# Patient Record
Sex: Female | Born: 1955 | Race: White | Hispanic: No | Smoking: Former smoker
Health system: Southern US, Community
[De-identification: ages and names within clinical notes are randomized; demographics above are authoritative.]

## PROBLEM LIST (undated history)

## (undated) DIAGNOSIS — I5022 Chronic systolic (congestive) heart failure: Secondary | ICD-10-CM

## (undated) DIAGNOSIS — E079 Disorder of thyroid, unspecified: Secondary | ICD-10-CM

## (undated) DIAGNOSIS — R634 Abnormal weight loss: Secondary | ICD-10-CM

## (undated) DIAGNOSIS — I428 Other cardiomyopathies: Secondary | ICD-10-CM

## (undated) DIAGNOSIS — I447 Left bundle-branch block, unspecified: Secondary | ICD-10-CM

## (undated) DIAGNOSIS — E039 Hypothyroidism, unspecified: Secondary | ICD-10-CM

## (undated) DIAGNOSIS — F419 Anxiety disorder, unspecified: Secondary | ICD-10-CM

## (undated) DIAGNOSIS — M797 Fibromyalgia: Secondary | ICD-10-CM

## (undated) HISTORY — DX: Abnormal weight loss: R63.4

## (undated) HISTORY — DX: Disorder of thyroid, unspecified: E07.9

## (undated) HISTORY — PX: TUBAL LIGATION: SHX77

## (undated) HISTORY — DX: Left bundle-branch block, unspecified: I44.7

## (undated) HISTORY — DX: Other cardiomyopathies: I42.8

## (undated) HISTORY — PX: CHOLECYSTECTOMY: SHX55

## (undated) HISTORY — DX: Chronic systolic (congestive) heart failure: I50.22

## (undated) HISTORY — PX: ANKLE SURGERY: SHX546

## (undated) HISTORY — DX: Anxiety disorder, unspecified: F41.9

## (undated) HISTORY — DX: Fibromyalgia: M79.7

---

## 2020-11-18 ENCOUNTER — Ambulatory Visit: Payer: 59 | Admitting: General Surgery

## 2020-11-27 ENCOUNTER — Ambulatory Visit: Payer: 59 | Admitting: General Surgery

## 2020-12-04 ENCOUNTER — Ambulatory Visit: Payer: 59 | Admitting: General Surgery

## 2020-12-11 ENCOUNTER — Ambulatory Visit: Payer: 59 | Admitting: General Surgery

## 2021-01-01 ENCOUNTER — Other Ambulatory Visit: Payer: Self-pay

## 2021-01-01 ENCOUNTER — Encounter: Payer: Self-pay | Admitting: General Surgery

## 2021-01-01 ENCOUNTER — Ambulatory Visit (INDEPENDENT_AMBULATORY_CARE_PROVIDER_SITE_OTHER): Payer: 59 | Admitting: General Surgery

## 2021-01-01 VITALS — BP 115/72 | HR 83 | Temp 98.2°F | Resp 16 | Ht 63.5 in | Wt 123.0 lb

## 2021-01-01 DIAGNOSIS — M6208 Separation of muscle (nontraumatic), other site: Secondary | ICD-10-CM

## 2021-01-01 NOTE — Progress Notes (Signed)
Stacey Hartman; [id number]; 07-01-1956   HPI Patient is a 65 year old white female who was referred to my care by Dr. [name] for evaluation and treatment of a possible ventral hernia.  Patient states she has lost a lot of weight over the past few years and does have increased swelling along the upper portion of her abdomen when she is sitting up.  She denies any nausea or vomiting.  She does have a hiatal hernia for which she takes Prilosec.  She has done this for many years.  She has never had abdominal surgery in the upper abdomen. History reviewed. No pertinent past medical history.  History reviewed. No pertinent surgical history.  History reviewed. No pertinent family history.  Current Outpatient Medications on File Prior to Visit  Medication Sig Dispense Refill  . acyclovir (ZOVIRAX) 800 MG tablet Take 800 mg by mouth 5 (five) times daily.    Marland Kitchen esomeprazole (NEXIUM) 20 MG capsule Take 1 capsule by mouth daily.    Marland Kitchen gabapentin (NEURONTIN) 300 MG capsule Take by mouth.    . levothyroxine (SYNTHROID) 150 MCG tablet      No current facility-administered medications on file prior to visit.    No Known Allergies  Social History   Substance and Sexual Activity  Alcohol Use Not Currently    Social History   Tobacco Use  Smoking Status Former Smoker  Smokeless Tobacco Never Used    Review of Systems  Constitutional: Negative.   HENT: Negative.   Eyes: Negative.   Respiratory: Negative.   Cardiovascular: Negative.   Gastrointestinal: Positive for abdominal pain and heartburn.  Genitourinary: Negative.   Musculoskeletal: Positive for joint pain.  Skin: Negative.   Neurological: Negative.   Endo/Heme/Allergies: Negative.   Psychiatric/Behavioral: Negative.     Objective   Vitals:   01/01/21 1010  BP: 115/72  Pulse: 83  Resp: 16  Temp: 98.2 F (36.8 C)  SpO2: 96%    Physical Exam Vitals reviewed.  Constitutional:      Appearance: Normal appearance. She is  normal weight. She is not ill-appearing.  HENT:     Head: Normocephalic and atraumatic.  Cardiovascular:     Rate and Rhythm: Normal rate and regular rhythm.     Heart sounds: Normal heart sounds. No murmur heard. No friction rub. No gallop.   Pulmonary:     Effort: Pulmonary effort is normal. No respiratory distress.     Breath sounds: Normal breath sounds. No stridor. No wheezing, rhonchi or rales.  Abdominal:     General: Abdomen is flat. Bowel sounds are normal. There is no distension.     Palpations: Abdomen is soft. There is no mass.     Tenderness: There is no abdominal tenderness. There is no guarding or rebound.     Hernia: No hernia is present.     Comments: Patient does have a diastases recti.  No discrete hernia is present.  Extra adipose tissue was noted transversely across the top portion of the abdomen.  Skin:    General: Skin is warm and dry.  Neurological:     Mental Status: She is alert and oriented to person, place, and time.   Primary care notes reviewed  Assessment  Diastases recti, no ventral hernia appreciated. Plan   Literature was given concerning the diastases recti.  No surgical intervention warranted at this time.  Follow-up as needed.

## 2021-01-01 NOTE — Patient Instructions (Signed)
Diastasis Recti  Diastasis recti is a condition in which the muscles of the abdomen (rectus abdominis muscles) become thin and separate. The result is a wider space between the muscles of the right and left abdomen (abdominal muscles). This wider space between the muscles may cause a bulge in the middle of the abdomen. This bulge may be noticed when a person is straining or when he or she sits up after lying down. Diastasis recti can affect men and women. It is most common among pregnant women, babies, people with obesity, and people who have had abdominal surgery. Exercise or surgery may help correct this condition. What are the causes? Common causes of this condition include:  Pregnancy. As the uterus grows in size, it puts pressure on the abdominal muscles, causing the muscles to separate.  Obesity. Excess fat puts pressure on abdominal muscles.  Weight lifting.  Some exercises of the abdomen.  Advanced age.  Genetics.  Having had surgery on the abdomen before. What increases the risk? This condition is more likely to develop in:  Women.  Newborns, especially newborns who are born early (prematurely). What are the signs or symptoms? Common symptoms of this condition include:  A bulge in the middle of your abdomen. You will notice it most when you sit up or strain.  Pain in your low back, hips, or the area between your hip bones (pelvis).  Constipation.  Being unable to control when you urinate (urinary incontinence).  Bloating.  Poor posture. How is this diagnosed? This condition is diagnosed with a physical exam. During the exam, your health care provider will ask you to lie flat on your back and do a crunch or half sit-up. If you have diastasis recti, a bulge will appear lengthwise between your abdominal muscles in the center of your abdomen. Your health care provider will measure the gap between your muscles with one of the following:  A medical device used to measure  the space between two objects (caliper).  A tape measure.  CT scan.  Ultrasound.  Finger spaces. Your health care provider will measure the space using his or her fingers. How is this treated? If your muscle separation is not too large, you may not need treatment. However, if you are a woman who plans to become pregnant again, you should treat this condition before your next pregnancy. Treatment may include:  Physical therapy exercises to strengthen and tighten your abdominal muscles.  Lifestyle changes such as weight loss and exercise.  Over-the-counter pain medicines as needed.  Surgery to correct the separation. Follow these instructions at home: Activity  Return to your normal activities as told by your health care provider. Ask your health care provider what activities are safe for you.  Do exercises as told by your health care provider. Make sure you are doing your exercises and movements correctly when lifting weights or doing exercises using your abdominal muscles or the muscles in the center of your body that give stability (core muscles). Proper form can help to prevent this condition from happening again. General instructions  If you are overweight, ask your health care provider for help with weight loss. Losing even a small amount of weight can help to improve your diastasis recti.  Take over-the-counter or prescription medicines only as told by your health care provider.  Do not strain. Straining can make the separation worse. Examples of straining include: ? Pushing hard to have a bowel movement, such as when you have constipation. ? Lifting heavy  objects or lifting children. ? Standing up and sitting down.  You may need to take these actions to prevent or treat constipation: ? Drink enough fluid to keep your urine pale yellow. ? Take over-the-counter or prescription medicines. ? Eat foods that are high in fiber, such as beans, whole grains, and fresh fruits and  vegetables. ? Limit foods that are high in fat and processed sugars, such as fried or sweet foods.  Keep all follow-up visits. This is important. Contact a health care provider if:  You notice a new bulge in your abdomen. Get help right away if:  You experience severe discomfort in your abdomen.  You develop severe abdominal pain along with nausea, vomiting, or a fever. Summary  Diastasis recti is a condition in which the muscles of the abdomen (rectus abdominismuscles) become thin and separate. You may notice a bulge in your abdomen because the space has widened between the muscles of the right and left abdomen.  The most common symptom is a bulge in the middle of your abdomen. You will notice it most when you sit up or strain.  This condition is diagnosed with a physical exam.  If the muscle separation is not too big, you may not need treatment. Otherwise, you may need to do physical therapy or have surgery. This information is not intended to replace advice given to you by your health care provider. Make sure you discuss any questions you have with your health care provider. Document Revised: 04/18/2020 Document Reviewed: 04/18/2020 Elsevier Patient Education  [place].

## 2021-07-08 ENCOUNTER — Institutional Professional Consult (permissible substitution): Payer: 59 | Admitting: Pulmonary Disease

## 2021-08-07 ENCOUNTER — Ambulatory Visit: Payer: 59 | Admitting: Allergy & Immunology

## 2021-08-13 ENCOUNTER — Ambulatory Visit: Payer: 59 | Admitting: Allergy

## 2021-08-25 ENCOUNTER — Ambulatory Visit (INDEPENDENT_AMBULATORY_CARE_PROVIDER_SITE_OTHER): Payer: 59 | Admitting: Internal Medicine

## 2021-08-25 ENCOUNTER — Other Ambulatory Visit: Payer: Self-pay

## 2021-08-25 ENCOUNTER — Encounter: Payer: Self-pay | Admitting: Internal Medicine

## 2021-08-25 DIAGNOSIS — R0609 Other forms of dyspnea: Secondary | ICD-10-CM | POA: Diagnosis not present

## 2021-08-25 NOTE — Patient Instructions (Addendum)
Stiolto one puff a day x one week and if not better start on 2 puffs each am   Omeprazole 20 mg Take 30- 60 min before your first and last meals of the day   GERD (REFLUX)  is an extremely common cause of respiratory symptoms just like yours , many times with no obvious heartburn at all.    It can be treated with medication, but also with lifestyle changes including elevation of the head of your bed (ideally with 6 -8inch blocks under the headboard of your bed),  Smoking cessation, avoidance of late meals, excessive alcohol, and avoid fatty foods, chocolate, peppermint, colas, red wine, and acidic juices such as orange juice.  NO MINT OR MENTHOL PRODUCTS SO NO COUGH DROPS  USE SUGARLESS CANDY INSTEAD (Jolley ranchers or Stover's or Life Savers) or even ice chips will also do - the key is to swallow to prevent all throat clearing. NO OIL BASED VITAMINS - use powdered substitutes.  Avoid fish oil when coughing.   Please remember to go to the lab department   for your tests - we will call you with the results when they are available.     Please remember to go to the  x-ray department  @  [facility] for your tests - we will call you with the results when they are available      If you do have ulcerative colitis it may be affecting your airways - discuss with your GI doctor   Please schedule a follow up office visit in 6 weeks, call sooner if needed with pfts next available

## 2021-08-25 NOTE — Progress Notes (Signed)
Stacey Hartman, female    DOB: 13-Feb-1956,    MRN: [id number]   Brief patient profile:  2   yowf quit smoking  around 2002 with cough/sob completely improved to point where no symptoms including walking up to several miles including hills but dx fibromyalgia since 2012  then August 2021 salmonella/rx as out pt with "pcn" then severe skin peeling lost 22 lb and persistent diarrhea/sob/ cough referred to pulmonary clinic in [facility]  08/25/2021 by [name] already under care of skin doctor in [place], GI doctor in [place], rheumatology eval pending.      History of Present Illness  08/25/2021  Pulmonary/ 1st office eval/ Stacey Hartman / [place] [facility]  Chief Complaint  Patient presents with   Consult    Hx of COPD. After penicillin has noticed struggle with breathing since then (03/2020).   Coughs up green mucus    Dyspnea:  getting worse to point of 100 ft doe Cough: congested cough worse when lies down  Sleep: bed is flat with 3 pillows  SABA use: spiriva and albuterol helped / has not tried pred for this illness but took it previously for "bronchitis" and seemed to help Omeprazole bid per GI   No obvious pattern in day to day or daytime variability or assoc or mucus plugs or hemoptysis or cp or chest tightness, subjective wheeze or overt sinus or hb symptoms.     Also denies any obvious fluctuation of symptoms with weather or environmental changes or other aggravating or alleviating factors except as outlined above   No unusual exposure hx or h/o childhood pna/ asthma or knowledge of premature birth.  Current Allergies, Complete Past Medical History, Past Surgical History, Family History, and Social History were reviewed in [employer] record.  ROS  The following are not active complaints unless bolded Hoarseness, sore throat, dysphagia, dental problems, itching, sneezing,  nasal congestion or discharge of excess mucus or purulent secretions, ear ache,   fever,  chills, sweats, unintended wt loss or wt gain, classically pleuritic or exertional cp,  orthopnea pnd or arm/hand swelling  or leg swelling, presyncope, palpitations, abdominal pain, anorexia, nausea, vomiting, diarrhea  or change in bowel habits or change in bladder habits, change in stools or change in urine, dysuria, hematuria,  rash, arthralgias, visual complaints, headache, numbness, weakness or ataxia or problems with walking or coordination,  change in mood or  memory.           No past medical history on file.  Outpatient Medications Prior to Visit-  - NOTE:   Unable to verify as accurately reflecting what pt takes    Medication Sig Dispense Refill   acyclovir (ZOVIRAX) 800 MG tablet Take 800 mg by mouth 5 (five) times daily.     esomeprazole (NEXIUM) 20 MG capsule Take 1 capsule by mouth daily.     gabapentin (NEURONTIN) 300 MG capsule Take by mouth.     levothyroxine (SYNTHROID) 150 MCG tablet      No facility-administered medications prior to visit.     Objective:     BP 124/80 (BP Location: Left Arm, Patient Position: Sitting)    Pulse 87    Temp 98.8 F (37.1 C) (Temporal)    Ht 5\' 4"  (1.626 m)    Wt 128 lb (58.1 kg)    SpO2 97% Comment: ra   BMI 21.97 kg/m   SpO2: 97 % (ra)  Amb thin wf nad/ min congested cough  HEENT : pt wearing mask  not removed for exam due to covid - 19 concerns.   NECK :  without JVD/Nodes/TM/ nl carotid upstrokes bilaterally   LUNGS: no acc muscle use,  Min barrel  contour chest wall with bilateral  slightly decreased bs s audible wheeze and  without cough on insp or exp maneuvers and min  Hyperresonant  to  percussion bilaterally     CV:  RRR  no s3 or murmur or increase in P2, and no edema   ABD:  soft and nontender with pos end  insp Hoover's  in the supine position. No bruits or organomegaly appreciated, bowel sounds nl  MS:   Nl gait/  ext warm without deformities, calf tenderness, cyanosis or clubbing No obvious joint restrictions    SKIN: warm and dry without lesions    NEURO:  alert, approp, nl sensorium with  no motor or cerebellar deficits apparent.       CXR PA and Lateral:   08/25/2021 :    I personally reviewed images and agree with radiology impression as follows:    Did not go for cxr as rec     Assessment   DOE (dyspnea on exertion) Onset was August 2021 s/p quit smoking 2003 s residual doe - assoc with chronic diarrhea at wt loss ? Inflammatory bowel dz?  - 08/25/2021   Walked on RA  x  3  lap(s) =  approx 450  ft  @ mod to fast pace, stopped due to end of study, sob p 1st lap with lowest 02 sats 97% - 08/25/2021  After extensive coaching inhaler device,  effectiveness =    75% with respimat > try stiolto 1 qd x one week then 2 qd p that if tolerates/effective  Symptoms are markedly disproportionate to objective findings and not clear to what extent this is actually a pulmonary  problem but pt does appear to have difficult to sort out respiratory symptoms of unknown origin for which  DDX  = almost all start with A and  include Adherence, Ace Inhibitors, Acid Reflux, Active Sinus Disease, Alpha 1 Antitripsin deficiency, Anxiety masquerading as Airways dz,  ABPA,  Allergy(esp in young), Aspiration (esp in elderly), Adverse effects of meds,  Active smoking or Vaping, A bunch of PE's/clot burden (a few small clots can't cause this syndrome unless there is already severe underlying pulm or vascular dz with poor reserve),  Anemia or thyroid disorder, plus two Bs  = Bronchiectasis and Beta blocker use..and one C= CHF     Adherence is always the initial "prime suspect" and is a multilayered concern that requires a "trust but verify" approach in every patient - starting with knowing how to use medications, especially inhalers, correctly, keeping up with refills and understanding the fundamental difference between maintenance and prns vs those medications only taken for a very short course and then stopped and not  refilled.  - see smi teaching above -return with all meds in hand using a trust but verify approach to confirm accurate Medication  Reconciliation The principal here is that until we are certain that the  patients are doing what we've asked, it makes no sense to ask them to do more.   ? Acid (or non-acid) GERD > always difficult to exclude as up to 75% of pts in some series report no assoc GI/ Heartburn symptoms> rec continue max (24h)  acid suppression and diet restrictions/ reviewed     ? Allergies/Asthma/ copd > check profile, hold off on more abx  for now and rx with stiolto for now pending return for pfts but doubt she has severe copd, perhaps CB and ? Airway involvement from inflammatory bowel dz  ? Alpha one AT > check phenotype   ? Anemia/ thryoid dz  ? Adverse drug effects > none of the usual suspects listed   ? Anxiety/depression/ deconditioning  > usually at the bottom of this list of usual suspects but   may interfere with adherence and also interpretation of response or lack thereof to symptom management which can be quite subjective.   ? Active sinus dz/ bronchiectasis > CT's can be considered next ov if not improving.  ? chf > send bnp   Each maintenance medication was reviewed in detail including emphasizing most importantly the difference between maintenance and prns and under what circumstances the prns are to be triggered using an action plan format where appropriate.  Total time for H and P, chart review, counseling, reviewing smi device(s) , directly observing portions of ambulatory 02 saturation study/ and generating customized AVS unique to this office visit / same day charting = 47 min                  Christinia Gully, MD 08/25/2021

## 2021-08-26 ENCOUNTER — Encounter: Payer: Self-pay | Admitting: Internal Medicine

## 2021-08-26 NOTE — Assessment & Plan Note (Addendum)
Onset was August 2021 s/p quit smoking 2003 s residual doe - assoc with chronic diarrhea at wt loss ? Inflammatory bowel dz?  - 08/25/2021   Walked on RA  x  3  lap(s) =  approx 450  ft  @ mod to fast pace, stopped due to end of study, sob p 1st lap with lowest 02 sats 97% - 08/25/2021  After extensive coaching inhaler device,  effectiveness =    75% with respimat > try stiolto 1 qd x one week then 2 qd p that if tolerates/effective  Symptoms are markedly disproportionate to objective findings and not clear to what extent this is actually a pulmonary  problem but pt does appear to have difficult to sort out respiratory symptoms of unknown origin for which  DDX  = almost all start with A and  include Adherence, Ace Inhibitors, Acid Reflux, Active Sinus Disease, Alpha 1 Antitripsin deficiency, Anxiety masquerading as Airways dz,  ABPA,  Allergy(esp in young), Aspiration (esp in elderly), Adverse effects of meds,  Active smoking or Vaping, A bunch of PE's/clot burden (a few small clots can't cause this syndrome unless there is already severe underlying pulm or vascular dz with poor reserve),  Anemia or thyroid disorder, plus two Bs  = Bronchiectasis and Beta blocker use..and one C= CHF     Adherence is always the initial "prime suspect" and is a multilayered concern that requires a "trust but verify" approach in every patient - starting with knowing how to use medications, especially inhalers, correctly, keeping up with refills and understanding the fundamental difference between maintenance and prns vs those medications only taken for a very short course and then stopped and not refilled.  - see smi teaching above -return with all meds in hand using a trust but verify approach to confirm accurate Medication  Reconciliation The principal here is that until we are certain that the  patients are doing what we've asked, it makes no sense to ask them to do more.   ? Acid (or non-acid) GERD > always difficult to  exclude as up to 75% of pts in some series report no assoc GI/ Heartburn symptoms> rec continue max (24h)  acid suppression and diet restrictions/ reviewed     ? Allergies/Asthma/ copd > check profile, hold off on more abx for now and rx with stiolto for now pending return for pfts but doubt she has severe copd, perhaps CB and ? Airway involvement from inflammatory bowel dz  ? Alpha one AT > check phenotype   ? Anemia/ thryoid dz  ? Adverse drug effects > none of the usual suspects listed   ? Anxiety/depression/ deconditioning  > usually at the bottom of this list of usual suspects but   may interfere with adherence and also interpretation of response or lack thereof to symptom management which can be quite subjective.   ? Active sinus dz/ bronchiectasis > CT's can be considered next ov if not improving.  ? chf > send bnp   Each maintenance medication was reviewed in detail including emphasizing most importantly the difference between maintenance and prns and under what circumstances the prns are to be triggered using an action plan format where appropriate.  Total time for H and P, chart review, counseling, reviewing smi device(s) , directly observing portions of ambulatory 02 saturation study/ and generating customized AVS unique to this office visit / same day charting = 47 min

## 2021-08-27 LAB — ALPHA-1-ANTITRYPSIN PHENOTYP: A-1 Antitrypsin: 146 mg/dL (ref 101–187)

## 2021-08-27 LAB — CBC WITH DIFFERENTIAL/PLATELET
Basophils Absolute: 0.1 10*3/uL (ref 0.0–0.2)
Basos: 1 %
EOS (ABSOLUTE): 0.3 10*3/uL (ref 0.0–0.4)
Eos: 4 %
Hematocrit: 38.3 % (ref 34.0–46.6)
Hemoglobin: 13.2 g/dL (ref 11.1–15.9)
Immature Grans (Abs): 0 10*3/uL (ref 0.0–0.1)
Immature Granulocytes: 0 %
Lymphocytes Absolute: 2.2 10*3/uL (ref 0.7–3.1)
Lymphs: 34 %
MCH: 31 pg (ref 26.6–33.0)
MCHC: 34.5 g/dL (ref 31.5–35.7)
MCV: 90 fL (ref 79–97)
Monocytes Absolute: 0.5 10*3/uL (ref 0.1–0.9)
Monocytes: 7 %
Neutrophils Absolute: 3.5 10*3/uL (ref 1.4–7.0)
Neutrophils: 54 %
Platelets: 193 10*3/uL (ref 150–450)
RBC: 4.26 x10E6/uL (ref 3.77–5.28)
RDW: 13.5 % (ref 11.7–15.4)
WBC: 6.5 10*3/uL (ref 3.4–10.8)

## 2021-08-27 LAB — BASIC METABOLIC PANEL
BUN/Creatinine Ratio: 19 (ref 12–28)
BUN: 15 mg/dL (ref 8–27)
CO2: 23 mmol/L (ref 20–29)
Calcium: 9.1 mg/dL (ref 8.7–10.3)
Chloride: 107 mmol/L — ABNORMAL HIGH (ref 96–106)
Creatinine, Ser: 0.8 mg/dL (ref 0.57–1.00)
Glucose: 86 mg/dL (ref 70–99)
Potassium: 4.7 mmol/L (ref 3.5–5.2)
Sodium: 147 mmol/L — ABNORMAL HIGH (ref 134–144)
eGFR: 82 mL/min/{1.73_m2} (ref >59)

## 2021-08-27 LAB — BRAIN NATRIURETIC PEPTIDE: BNP: 442.8 pg/mL — ABNORMAL HIGH (ref 0.0–100.0)

## 2021-08-27 LAB — IGE: IgE (Immunoglobulin E), Serum: 46 IU/mL (ref 6–495)

## 2021-08-27 LAB — SEDIMENTATION RATE: Sed Rate: 5 mm/hr (ref 0–40)

## 2021-08-28 ENCOUNTER — Other Ambulatory Visit: Payer: Self-pay

## 2021-08-28 DIAGNOSIS — R0602 Shortness of breath: Secondary | ICD-10-CM

## 2021-08-28 DIAGNOSIS — R0609 Other forms of dyspnea: Secondary | ICD-10-CM

## 2021-10-01 ENCOUNTER — Other Ambulatory Visit: Payer: Self-pay

## 2021-10-01 ENCOUNTER — Ambulatory Visit (HOSPITAL_COMMUNITY)
Admission: RE | Admit: 2021-10-01 | Discharge: 2021-10-01 | Disposition: A | Discharge status: Home / Self Care | Payer: 59 | Source: Ambulatory Visit | Attending: Internal Medicine | Admitting: Internal Medicine

## 2021-10-01 DIAGNOSIS — R0602 Shortness of breath: Secondary | ICD-10-CM | POA: Diagnosis present

## 2021-10-01 DIAGNOSIS — R0609 Other forms of dyspnea: Secondary | ICD-10-CM | POA: Insufficient documentation

## 2021-10-01 LAB — ECHOCARDIOGRAM COMPLETE
AR max vel: 2.36 cm2
AV Area VTI: 2.22 cm2
AV Area mean vel: 2.31 cm2
AV Mean grad: 3 mmHg
AV Peak grad: 5.2 mmHg
Ao pk vel: 1.14 m/s
Area-P 1/2: 5.66 cm2
Calc EF: 22.7 %
MV VTI: 2.03 cm2
S' Lateral: 5.7 cm
Single Plane A2C EF: 23.8 %
Single Plane A4C EF: 20.9 %

## 2021-10-01 IMAGING — DX DG CHEST 2V
2 series · 2 of 2 positions shown · non-contrast
Comparison: No priors.

CLINICAL DATA: 65-year-old female with history of worsening
productive cough for the past 3 weeks.

EXAM:
CHEST - 2 VIEW

[chest pa]
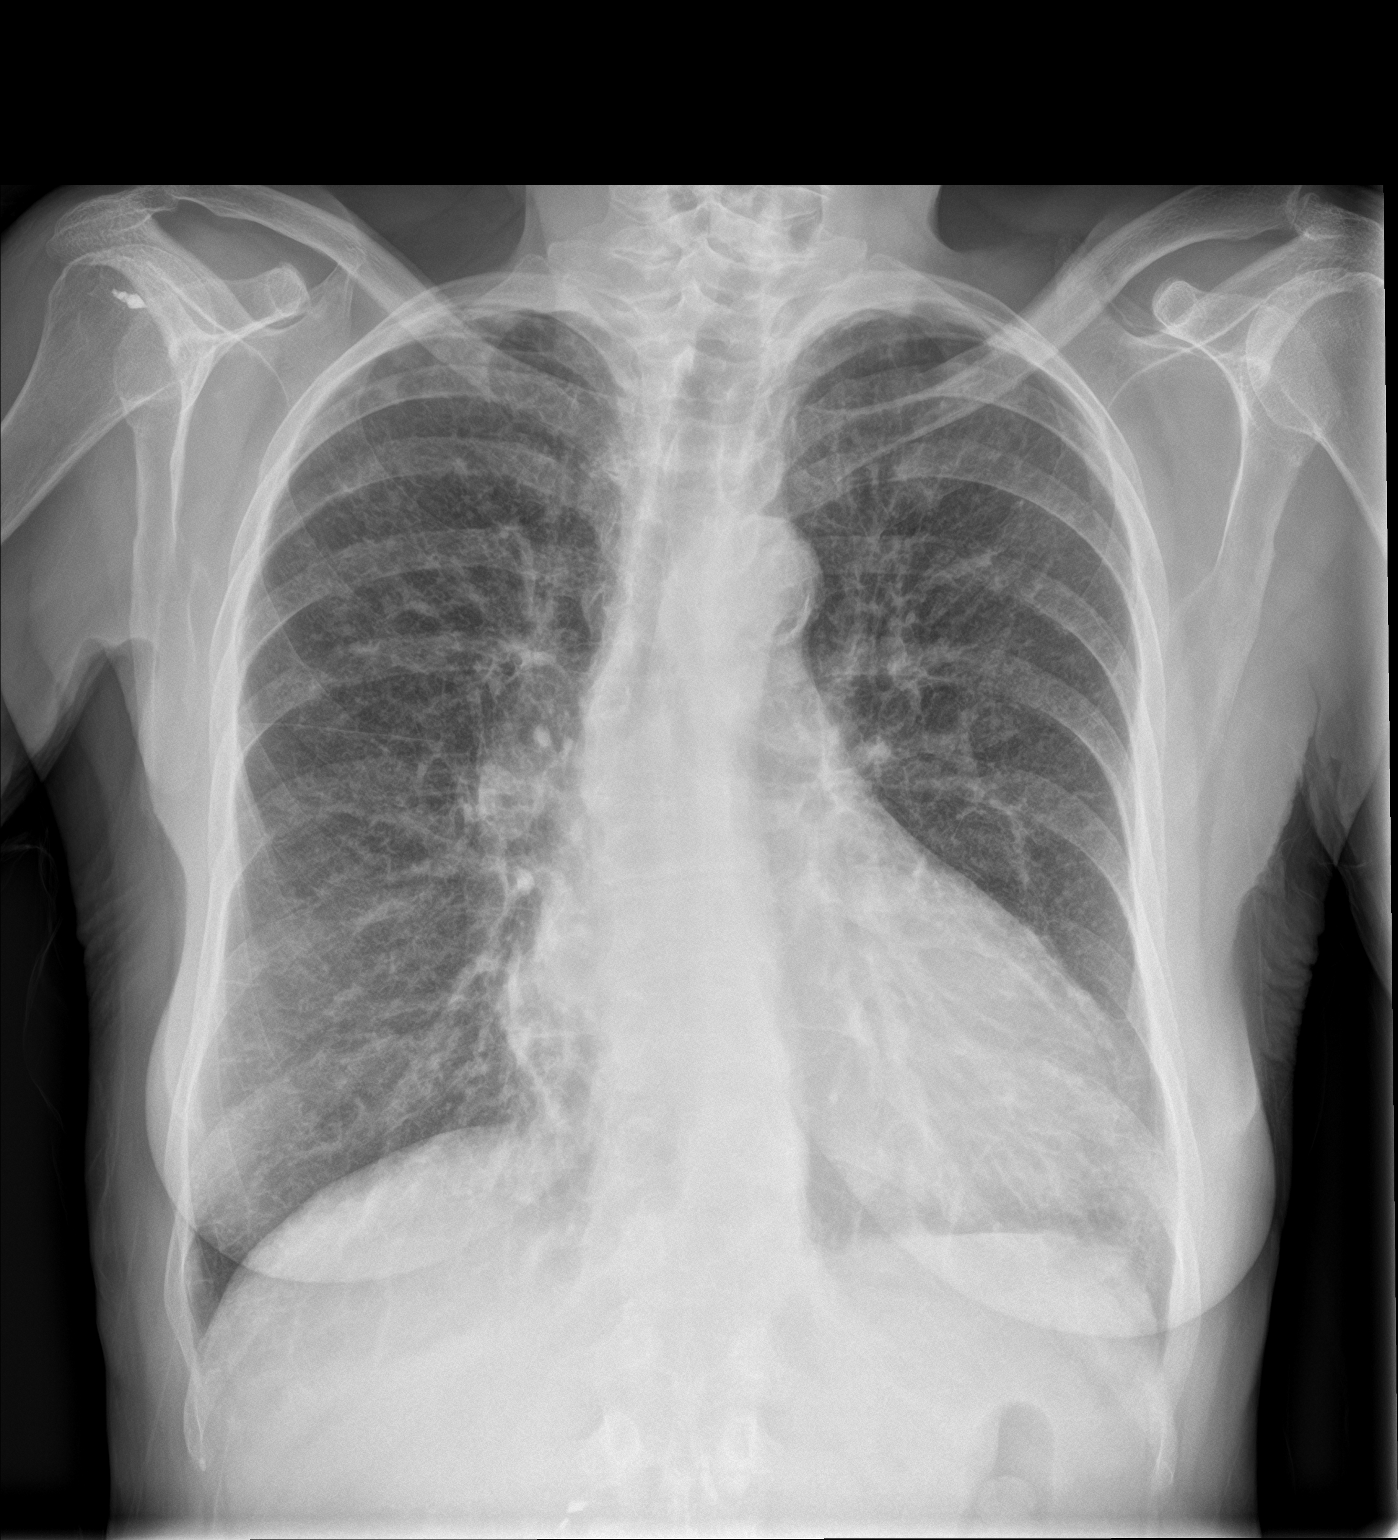

[chest lat]
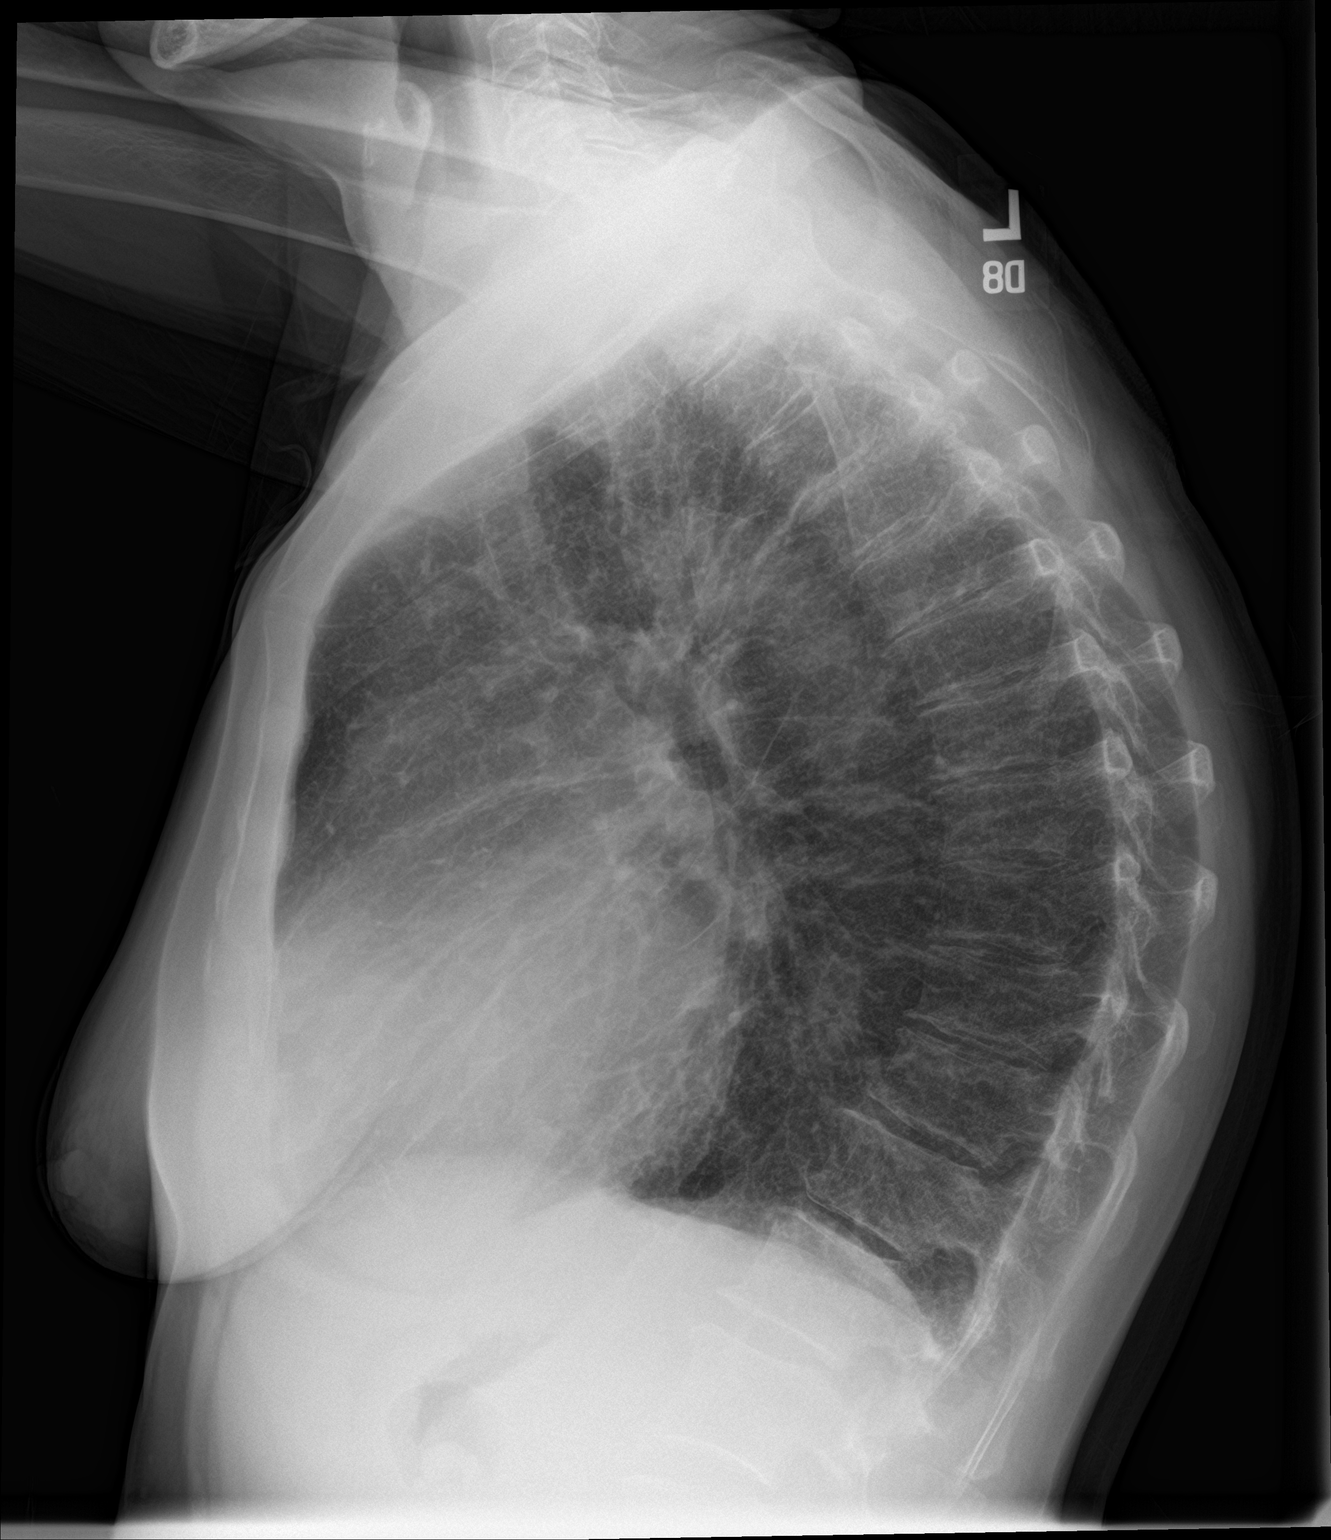

[2 of 2 positions shown; findings below may reference images not displayed]

FINDINGS: Lung volumes are normal. No consolidative airspace disease. However,
there is widespread interstitial prominence and diffuse
peribronchial cuffing. No pleural effusions. No pneumothorax. No
pulmonary nodule or mass noted. Pulmonary vasculature and the
cardiomediastinal silhouette are within normal limits.
Atherosclerosis in the thoracic aorta.
IMPRESSION: 1. The appearance the chest suggests acute bronchitis.
2. Aortic atherosclerosis.

## 2021-10-01 NOTE — Progress Notes (Signed)
*  PRELIMINARY RESULTS* Echocardiogram 2D Echocardiogram has been performed.  Stacey Hartman 10/01/2021, 3:02 PM

## 2021-10-02 ENCOUNTER — Telehealth: Payer: Self-pay | Admitting: Internal Medicine

## 2021-10-02 DIAGNOSIS — I5020 Unspecified systolic (congestive) heart failure: Secondary | ICD-10-CM

## 2021-10-02 NOTE — Telephone Encounter (Signed)
Stacey Rockers, MD  10/01/2021  3:53 PM EST     Call patient :  I tried to call twice pm 10/01/2021 but "mailbox is full" and she has a problem that needs attention asap  = reduced ejection / poor pump funciton ? 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  She stated that PCP office called and she was told that PCP provider is out of town. She would like for our office to place referral to cardiology. Urgent Referral placed.  Patient is also requesting CXR results.   Dr. [name], please advise. Thanks

## 2021-10-02 NOTE — Telephone Encounter (Signed)
Patient is aware of below recommendations and voiced her understanding.  She is seeing Dr. Harriet Masson 10/05/2021.  Routing to Dr. [name] as an Juluis Rainier

## 2021-10-05 ENCOUNTER — Ambulatory Visit (INDEPENDENT_AMBULATORY_CARE_PROVIDER_SITE_OTHER): Payer: 59 | Admitting: Cardiology

## 2021-10-05 ENCOUNTER — Encounter (HOSPITAL_COMMUNITY): Payer: Self-pay

## 2021-10-05 ENCOUNTER — Encounter (HOSPITAL_COMMUNITY)
Admission: EM | Disposition: A | Discharge status: Home Health | Payer: Self-pay | Source: Home / Self Care | Attending: Cardiology

## 2021-10-05 ENCOUNTER — Inpatient Hospital Stay (HOSPITAL_COMMUNITY)
Admission: EM | Admit: 2021-10-05 | Discharge: 2021-10-09 | DRG: 286 | Disposition: A | Discharge status: Home Health | Payer: 59 | Attending: Cardiology | Admitting: Cardiology

## 2021-10-05 ENCOUNTER — Other Ambulatory Visit: Payer: Self-pay

## 2021-10-05 ENCOUNTER — Encounter: Payer: Self-pay | Admitting: Cardiology

## 2021-10-05 ENCOUNTER — Inpatient Hospital Stay (HOSPITAL_COMMUNITY): Payer: 59

## 2021-10-05 VITALS — BP 120/84 | HR 81 | Ht 63.5 in | Wt 125.4 lb

## 2021-10-05 DIAGNOSIS — J439 Emphysema, unspecified: Secondary | ICD-10-CM | POA: Diagnosis present

## 2021-10-05 DIAGNOSIS — I447 Left bundle-branch block, unspecified: Secondary | ICD-10-CM | POA: Diagnosis present

## 2021-10-05 DIAGNOSIS — I63411 Cerebral infarction due to embolism of right middle cerebral artery: Secondary | ICD-10-CM | POA: Diagnosis not present

## 2021-10-05 DIAGNOSIS — R0989 Other specified symptoms and signs involving the circulatory and respiratory systems: Secondary | ICD-10-CM

## 2021-10-05 DIAGNOSIS — I5021 Acute systolic (congestive) heart failure: Secondary | ICD-10-CM | POA: Diagnosis present

## 2021-10-05 DIAGNOSIS — R911 Solitary pulmonary nodule: Secondary | ICD-10-CM | POA: Diagnosis present

## 2021-10-05 DIAGNOSIS — Z8249 Family history of ischemic heart disease and other diseases of the circulatory system: Secondary | ICD-10-CM | POA: Diagnosis not present

## 2021-10-05 DIAGNOSIS — R0609 Other forms of dyspnea: Secondary | ICD-10-CM

## 2021-10-05 DIAGNOSIS — I272 Pulmonary hypertension, unspecified: Secondary | ICD-10-CM | POA: Insufficient documentation

## 2021-10-05 DIAGNOSIS — Z79899 Other long term (current) drug therapy: Secondary | ICD-10-CM

## 2021-10-05 DIAGNOSIS — F419 Anxiety disorder, unspecified: Secondary | ICD-10-CM | POA: Diagnosis present

## 2021-10-05 DIAGNOSIS — Z20822 Contact with and (suspected) exposure to covid-19: Secondary | ICD-10-CM | POA: Diagnosis present

## 2021-10-05 DIAGNOSIS — R29810 Facial weakness: Secondary | ICD-10-CM | POA: Diagnosis not present

## 2021-10-05 DIAGNOSIS — Z880 Allergy status to penicillin: Secondary | ICD-10-CM

## 2021-10-05 DIAGNOSIS — Z91199 Patient's noncompliance with other medical treatment and regimen due to unspecified reason: Secondary | ICD-10-CM | POA: Diagnosis not present

## 2021-10-05 DIAGNOSIS — Z79890 Hormone replacement therapy (postmenopausal): Secondary | ICD-10-CM | POA: Diagnosis not present

## 2021-10-05 DIAGNOSIS — I340 Nonrheumatic mitral (valve) insufficiency: Secondary | ICD-10-CM

## 2021-10-05 DIAGNOSIS — Z87891 Personal history of nicotine dependence: Secondary | ICD-10-CM

## 2021-10-05 DIAGNOSIS — E039 Hypothyroidism, unspecified: Secondary | ICD-10-CM | POA: Diagnosis present

## 2021-10-05 DIAGNOSIS — R634 Abnormal weight loss: Secondary | ICD-10-CM | POA: Diagnosis present

## 2021-10-05 DIAGNOSIS — I6340 Cerebral infarction due to embolism of unspecified cerebral artery: Secondary | ICD-10-CM | POA: Insufficient documentation

## 2021-10-05 DIAGNOSIS — R29702 NIHSS score 2: Secondary | ICD-10-CM | POA: Diagnosis not present

## 2021-10-05 DIAGNOSIS — I429 Cardiomyopathy, unspecified: Secondary | ICD-10-CM | POA: Diagnosis not present

## 2021-10-05 DIAGNOSIS — I712 Thoracic aortic aneurysm, without rupture, unspecified: Secondary | ICD-10-CM | POA: Diagnosis present

## 2021-10-05 DIAGNOSIS — Z6824 Body mass index (BMI) 24.0-24.9, adult: Secondary | ICD-10-CM | POA: Diagnosis not present

## 2021-10-05 DIAGNOSIS — G43109 Migraine with aura, not intractable, without status migrainosus: Secondary | ICD-10-CM | POA: Diagnosis not present

## 2021-10-05 DIAGNOSIS — R0600 Dyspnea, unspecified: Secondary | ICD-10-CM

## 2021-10-05 DIAGNOSIS — I2510 Atherosclerotic heart disease of native coronary artery without angina pectoris: Secondary | ICD-10-CM | POA: Diagnosis present

## 2021-10-05 DIAGNOSIS — I361 Nonrheumatic tricuspid (valve) insufficiency: Secondary | ICD-10-CM | POA: Diagnosis not present

## 2021-10-05 DIAGNOSIS — I5023 Acute on chronic systolic (congestive) heart failure: Principal | ICD-10-CM | POA: Diagnosis present

## 2021-10-05 DIAGNOSIS — I6349 Cerebral infarction due to embolism of other cerebral artery: Secondary | ICD-10-CM | POA: Diagnosis not present

## 2021-10-05 DIAGNOSIS — R079 Chest pain, unspecified: Secondary | ICD-10-CM | POA: Diagnosis not present

## 2021-10-05 HISTORY — DX: Hypothyroidism, unspecified: E03.9

## 2021-10-05 HISTORY — PX: RIGHT/LEFT HEART CATH AND CORONARY ANGIOGRAPHY: CATH118266

## 2021-10-05 LAB — POCT I-STAT, CHEM 8
BUN: 9 mg/dL (ref 8–23)
BUN: 10 mg/dL (ref 8–23)
Calcium, Ion: 1.16 mmol/L (ref 1.15–1.40)
Calcium, Ion: 1.21 mmol/L (ref 1.15–1.40)
Chloride: 105 mmol/L (ref 98–111)
Chloride: 104 mmol/L (ref 98–111)
Creatinine, Ser: 0.6 mg/dL (ref 0.44–1.00)
Creatinine, Ser: 0.6 mg/dL (ref 0.44–1.00)
Glucose, Bld: 102 mg/dL — ABNORMAL HIGH (ref 70–99)
Glucose, Bld: 127 mg/dL — ABNORMAL HIGH (ref 70–99)
HCT: 41 % (ref 36.0–46.0)
HCT: 40 % (ref 36.0–46.0)
Hemoglobin: 13.9 g/dL (ref 12.0–15.0)
Hemoglobin: 13.6 g/dL (ref 12.0–15.0)
Potassium: 3.9 mmol/L (ref 3.5–5.1)
Potassium: 3.9 mmol/L (ref 3.5–5.1)
Sodium: 143 mmol/L (ref 135–145)
Sodium: 142 mmol/L (ref 135–145)
TCO2: 28 mmol/L (ref 22–32)
TCO2: 25 mmol/L (ref 22–32)

## 2021-10-05 LAB — POCT I-STAT EG7
Acid-Base Excess: 3 mmol/L — ABNORMAL HIGH (ref 0.0–2.0)
Acid-Base Excess: 3 mmol/L — ABNORMAL HIGH (ref 0.0–2.0)
Bicarbonate: 27.8 mmol/L (ref 20.0–28.0)
Bicarbonate: 28 mmol/L (ref 20.0–28.0)
Calcium, Ion: 1.22 mmol/L (ref 1.15–1.40)
Calcium, Ion: 1.23 mmol/L (ref 1.15–1.40)
HCT: 40 % (ref 36.0–46.0)
HCT: 39 % (ref 36.0–46.0)
Hemoglobin: 13.6 g/dL (ref 12.0–15.0)
Hemoglobin: 13.3 g/dL (ref 12.0–15.0)
O2 Saturation: 60 %
O2 Saturation: 66 %
Potassium: 4 mmol/L (ref 3.5–5.1)
Potassium: 4 mmol/L (ref 3.5–5.1)
Sodium: 142 mmol/L (ref 135–145)
Sodium: 142 mmol/L (ref 135–145)
TCO2: 29 mmol/L (ref 22–32)
TCO2: 29 mmol/L (ref 22–32)
pCO2, Ven: 44 mmHg (ref 44.0–60.0)
pCO2, Ven: 44.9 mmHg (ref 44.0–60.0)
pH, Ven: 7.408 (ref 7.250–7.430)
pH, Ven: 7.402 (ref 7.250–7.430)
pO2, Ven: 31 mmHg — CL (ref 32.0–45.0)
pO2, Ven: 34 mmHg (ref 32.0–45.0)

## 2021-10-05 LAB — CBC
HCT: 39.8 % (ref 36.0–46.0)
HCT: 40.4 % (ref 36.0–46.0)
Hemoglobin: 12.9 g/dL (ref 12.0–15.0)
Hemoglobin: 13.8 g/dL (ref 12.0–15.0)
MCH: 30.8 pg (ref 26.0–34.0)
MCH: 31.5 pg (ref 26.0–34.0)
MCHC: 32.4 g/dL (ref 30.0–36.0)
MCHC: 34.2 g/dL (ref 30.0–36.0)
MCV: 95 fL (ref 80.0–100.0)
MCV: 92.2 fL (ref 80.0–100.0)
Platelets: 157 10*3/uL (ref 150–400)
Platelets: 176 10*3/uL (ref 150–400)
RBC: 4.19 MIL/uL (ref 3.87–5.11)
RBC: 4.38 MIL/uL (ref 3.87–5.11)
RDW: 14.1 % (ref 11.5–15.5)
RDW: 13.9 % (ref 11.5–15.5)
WBC: 6.6 10*3/uL (ref 4.0–10.5)
WBC: 8.3 10*3/uL (ref 4.0–10.5)
nRBC: 0 % (ref 0.0–0.2)
nRBC: 0 % (ref 0.0–0.2)

## 2021-10-05 LAB — COMPREHENSIVE METABOLIC PANEL
ALT: 18 U/L (ref 0–44)
AST: 27 U/L (ref 15–41)
Albumin: 3.9 g/dL (ref 3.5–5.0)
Alkaline Phosphatase: 75 U/L (ref 38–126)
Anion gap: 11 (ref 5–15)
BUN: 8 mg/dL (ref 8–23)
CO2: 24 mmol/L (ref 22–32)
Calcium: 9.2 mg/dL (ref 8.9–10.3)
Chloride: 105 mmol/L (ref 98–111)
Creatinine, Ser: 0.74 mg/dL (ref 0.44–1.00)
GFR, Estimated: >60 mL/min (ref >60)
Glucose, Bld: 133 mg/dL — ABNORMAL HIGH (ref 70–99)
Potassium: 3.7 mmol/L (ref 3.5–5.1)
Sodium: 140 mmol/L (ref 135–145)
Total Bilirubin: 0.4 mg/dL (ref 0.3–1.2)
Total Protein: 7.1 g/dL (ref 6.5–8.1)

## 2021-10-05 LAB — RESP PANEL BY RT-PCR (FLU A&B, COVID) ARPGX2
Influenza A by PCR: NEGATIVE
Influenza B by PCR: NEGATIVE
SARS Coronavirus 2 by RT PCR: NEGATIVE

## 2021-10-05 LAB — CREATININE, SERUM
Creatinine, Ser: 0.68 mg/dL (ref 0.44–1.00)
GFR, Estimated: >60 mL/min (ref >60)

## 2021-10-05 LAB — HIV ANTIBODY (ROUTINE TESTING W REFLEX): HIV Screen 4th Generation wRfx: NONREACTIVE

## 2021-10-05 LAB — BRAIN NATRIURETIC PEPTIDE: B Natriuretic Peptide: 635.9 pg/mL — ABNORMAL HIGH (ref 0.0–100.0)

## 2021-10-05 LAB — TROPONIN I (HIGH SENSITIVITY)
Troponin I (High Sensitivity): 9 ng/L (ref <18)
Troponin I (High Sensitivity): 9 ng/L (ref <18)

## 2021-10-05 LAB — PROTIME-INR
INR: 1.1 (ref 0.8–1.2)
Prothrombin Time: 13.9 seconds (ref 11.4–15.2)

## 2021-10-05 SURGERY — RIGHT/LEFT HEART CATH AND CORONARY ANGIOGRAPHY
Anesthesia: LOCAL

## 2021-10-05 MED ORDER — CARVEDILOL 3.125 MG PO TABS
3.1250 mg | ORAL_TABLET | Freq: Two times a day (BID) | ORAL | Status: DC
  Administered 2021-10-06 – 2021-10-09 (×7): 3.125 mg via ORAL
  Filled 2021-10-05 (×7): qty 1

## 2021-10-05 MED ORDER — HEPARIN (PORCINE) 25000 UT/250ML-% IV SOLN
700.0000 [IU]/h | INTRAVENOUS | Status: DC
  Filled 2021-10-05 (×2): qty 250

## 2021-10-05 MED ORDER — LEVOTHYROXINE SODIUM 75 MCG PO TABS
150.0000 ug | ORAL_TABLET | Freq: Every day | ORAL | Status: DC
Start: 2021-10-06 — End: 2021-10-09
  Administered 2021-10-06 – 2021-10-09 (×4): 150 ug via ORAL
  Filled 2021-10-05 (×4): qty 2

## 2021-10-05 MED ORDER — LIDOCAINE HCL (PF) 1 % IJ SOLN
INTRAMUSCULAR | Status: AC
  Filled 2021-10-05: qty 30

## 2021-10-05 MED ORDER — MIDAZOLAM HCL 2 MG/2ML IJ SOLN
INTRAMUSCULAR | Status: DC | PRN
  Administered 2021-10-05 (×2): .5 mg via INTRAVENOUS

## 2021-10-05 MED ORDER — SPIRONOLACTONE 12.5 MG HALF TABLET
12.5000 mg | ORAL_TABLET | Freq: Every day | ORAL | Status: DC
  Administered 2021-10-05 – 2021-10-07 (×3): 12.5 mg via ORAL
  Filled 2021-10-05 (×4): qty 1

## 2021-10-05 MED ORDER — FENTANYL CITRATE (PF) 100 MCG/2ML IJ SOLN
INTRAMUSCULAR | Status: DC | PRN
  Administered 2021-10-05 (×2): 12.5 ug via INTRAVENOUS

## 2021-10-05 MED ORDER — FENTANYL CITRATE (PF) 100 MCG/2ML IJ SOLN
INTRAMUSCULAR | Status: AC
  Filled 2021-10-05: qty 2

## 2021-10-05 MED ORDER — ACETAMINOPHEN 325 MG PO TABS: 650.0000 mg | ORAL_TABLET | ORAL | Status: DC | PRN

## 2021-10-05 MED ORDER — VERAPAMIL HCL 2.5 MG/ML IV SOLN
INTRAVENOUS | Status: DC | PRN
  Administered 2021-10-05: 10 mL via INTRA_ARTERIAL

## 2021-10-05 MED ORDER — SODIUM CHLORIDE 0.9 % IV SOLN: 250.0000 mL | INTRAVENOUS | Status: DC | PRN

## 2021-10-05 MED ORDER — SODIUM CHLORIDE 0.9 % IV SOLN: INTRAVENOUS | Status: AC

## 2021-10-05 MED ORDER — HEPARIN SODIUM (PORCINE) 1000 UNIT/ML IJ SOLN
INTRAMUSCULAR | Status: AC
  Filled 2021-10-05: qty 10

## 2021-10-05 MED ORDER — FUROSEMIDE 10 MG/ML IJ SOLN
40.0000 mg | Freq: Once | INTRAMUSCULAR | Status: AC
  Administered 2021-10-05: 40 mg via INTRAVENOUS
  Filled 2021-10-05: qty 4

## 2021-10-05 MED ORDER — HYDRALAZINE HCL 20 MG/ML IJ SOLN: 10.0000 mg | INTRAMUSCULAR | Status: AC | PRN

## 2021-10-05 MED ORDER — SODIUM CHLORIDE 0.9% FLUSH: 3.0000 mL | INTRAVENOUS | Status: DC | PRN

## 2021-10-05 MED ORDER — SODIUM CHLORIDE 0.9% FLUSH
3.0000 mL | Freq: Two times a day (BID) | INTRAVENOUS | Status: DC
  Administered 2021-10-05 – 2021-10-07 (×3): 3 mL via INTRAVENOUS

## 2021-10-05 MED ORDER — HEPARIN (PORCINE) 25000 UT/250ML-% IV SOLN
850.0000 [IU]/h | INTRAVENOUS | Status: DC
  Administered 2021-10-06: 700 [IU]/h via INTRAVENOUS
  Filled 2021-10-05 (×2): qty 250

## 2021-10-05 MED ORDER — VERAPAMIL HCL 2.5 MG/ML IV SOLN
INTRAVENOUS | Status: AC
  Filled 2021-10-05: qty 2

## 2021-10-05 MED ORDER — LABETALOL HCL 5 MG/ML IV SOLN: 10.0000 mg | INTRAVENOUS | Status: AC | PRN

## 2021-10-05 MED ORDER — HEPARIN SODIUM (PORCINE) 1000 UNIT/ML IJ SOLN
INTRAMUSCULAR | Status: DC | PRN
  Administered 2021-10-05: 3000 [IU] via INTRAVENOUS

## 2021-10-05 MED ORDER — MIDAZOLAM HCL 2 MG/2ML IJ SOLN
INTRAMUSCULAR | Status: AC
  Filled 2021-10-05: qty 2

## 2021-10-05 MED ORDER — HEPARIN BOLUS VIA INFUSION
3000.0000 [IU] | Freq: Once | INTRAVENOUS | Status: DC
  Filled 2021-10-05: qty 3000

## 2021-10-05 MED ORDER — ONDANSETRON HCL 4 MG/2ML IJ SOLN: 4.0000 mg | Freq: Four times a day (QID) | INTRAMUSCULAR | Status: DC | PRN

## 2021-10-05 MED ORDER — LIDOCAINE HCL (PF) 1 % IJ SOLN
INTRAMUSCULAR | Status: DC | PRN
  Administered 2021-10-05: 4 mL
  Administered 2021-10-05: 2 mL

## 2021-10-05 MED ORDER — SODIUM CHLORIDE 0.9% FLUSH
3.0000 mL | Freq: Two times a day (BID) | INTRAVENOUS | Status: DC
  Administered 2021-10-05 – 2021-10-09 (×7): 3 mL via INTRAVENOUS

## 2021-10-05 MED ORDER — HEPARIN (PORCINE) IN NACL 1000-0.9 UT/500ML-% IV SOLN
INTRAVENOUS | Status: DC | PRN
  Administered 2021-10-05 (×2): 500 mL

## 2021-10-05 MED ORDER — IOHEXOL 350 MG/ML SOLN
INTRAVENOUS | Status: DC | PRN
  Administered 2021-10-05: 40 mL via INTRA_ARTERIAL

## 2021-10-05 SURGICAL SUPPLY — 10 items
CATH 5FR JL3.5 JR4 ANG PIG MP (CATHETERS) ×1 IMPLANT
CATH SWAN GANZ 7F STRAIGHT (CATHETERS) ×1 IMPLANT
DEVICE RAD COMP TR BAND LRG (VASCULAR PRODUCTS) ×1 IMPLANT
GLIDESHEATH SLEND SS 6F .021 (SHEATH) ×1 IMPLANT
GLIDESHEATH SLENDER 7FR .021G (SHEATH) ×1 IMPLANT
GUIDEWIRE INQWIRE 1.5J.035X260 (WIRE) IMPLANT
INQWIRE 1.5J .035X260CM (WIRE) ×2
KIT HEART LEFT (KITS) ×2 IMPLANT
PACK CARDIAC CATHETERIZATION (CUSTOM PROCEDURE TRAY) ×2 IMPLANT
TRANSDUCER W/STOPCOCK (MISCELLANEOUS) ×2 IMPLANT

## 2021-10-05 NOTE — ED Provider Notes (Signed)
[facility] EMERGENCY DEPARTMENT Provider Note   CSN: [id number] Arrival date & time: 10/05/21  1518     History  Chief Complaint  Patient presents with   Shortness of Breath   Chest Pain    Stacey Hartman is a 66 y.o. female presenting to the emergency department as a direct referral for admission by cardiology team with concern for new onset worsening congestive heart failure, possible ischemic heart disease.  The patient ports that she has had intermittent chest pressure for several days, she is currently asymptomatic.  She does have some worsening dyspnea on exertion and shortness of breath.  She does not take any diuretics at home.  She is here with her husband at bedside  HPI     Home Medications Prior to Admission medications   Medication Sig Start Date End Date Taking? Authorizing Provider  acyclovir (ZOVIRAX) 800 MG tablet Take 800 mg by mouth as needed. 12/01/20   [provider]  esomeprazole (NEXIUM) 20 MG capsule Take 1 capsule by mouth daily.    [provider]  gabapentin (NEURONTIN) 300 MG capsule Take by mouth. Take one tablet at bedtime daily. 12/23/11   [provider]  levothyroxine (SYNTHROID) 150 MCG tablet  07/01/20   [provider]      Allergies    Penicillins    Review of Systems   Review of Systems  Physical Exam Updated Vital Signs BP 129/81 (BP Location: Left Arm)    Pulse 96    Temp 98 F (36.7 C) (Oral)    Resp 16    Ht 5' 0.35" (1.533 m)    Wt 56.9 kg    LMP  (LMP Unknown)    SpO2 100%    BMI 24.22 kg/m  Physical Exam Constitutional:      General: She is not in acute distress. HENT:     Head: Normocephalic and atraumatic.  Eyes:     Conjunctiva/sclera: Conjunctivae normal.     Pupils: Pupils are equal, round, and reactive to light.  Cardiovascular:     Rate and Rhythm: Regular rhythm. Tachycardia present.     Heart sounds: Murmur heard.  Pulmonary:     Effort: Pulmonary effort is  normal. No respiratory distress.     Comments: Fine crackles bilaterally on pulmonary exam, stable on room air 95% Abdominal:     General: There is no distension.     Tenderness: There is no abdominal tenderness.  Skin:    General: Skin is warm and dry.  Neurological:     General: No focal deficit present.     Mental Status: She is alert. Mental status is at baseline.  Psychiatric:        Mood and Affect: Mood normal.        Behavior: Behavior normal.    ED Results / Procedures / Treatments   Labs (all labs ordered are listed, but only abnormal results are displayed) Labs Reviewed  RESP PANEL BY RT-PCR (FLU A&B, COVID) ARPGX2  CBC  COMPREHENSIVE METABOLIC PANEL  BRAIN NATRIURETIC PEPTIDE  HEPARIN LEVEL (UNFRACTIONATED)  PROTIME-INR  TROPONIN I (HIGH SENSITIVITY)    EKG EKG Interpretation  Date/Time:  Monday October 05 2021 15:40:23 EST Ventricular Rate:  96 PR Interval:  144 QRS Duration: 170 QT Interval:  432 QTC Calculation: 545 R Axis:   132 Text Interpretation: Normal sinus rhythm Right axis deviation Left bundle branch block Abnormal ECG No previous ECGs available Confirmed by Sherwood Gambler [phone])  on 10/05/2021 3:48:30 PM  Radiology DG Chest 2 View  Result Date: 10/05/2021 CLINICAL DATA:  Chest pain, shortness of breath EXAM: CHEST - 2 VIEW COMPARISON:  10/01/2021 FINDINGS: Stable cardiomediastinal contours. Aortic atherosclerosis. Diffuse bilateral interstitial prominence is similar to the previous study. Lungs are hyperexpanded. No new focal airspace consolidation. No pleural effusion or pneumothorax. Exaggerated thoracic kyphosis. IMPRESSION: Unchanged bilateral interstitial prominence, which may reflect bronchitic type lung changes. No new airspace consolidation. Electronically Signed   By: Davina Poke D.O.   On: 10/05/2021 16:51    Procedures Procedures    Medications Ordered in ED Medications  sodium chloride flush (NS) 0.9 % injection 3 mL (has no  administration in time range)  furosemide (LASIX) injection 40 mg (has no administration in time range)  heparin bolus via infusion 3,000 Units (has no administration in time range)  heparin ADULT infusion 100 units/mL (25000 units/251mL) (has no administration in time range)    ED Course/ Medical Decision Making/ A&P Clinical Course as of 10/05/21 1656  Mon Oct 05, 2021  1552 Cardiologist Dr. Aundra Dubin at bed side.  [EH]    Clinical Course User Index [EH] Lorin Glass, PA-C                           Medical Decision Making Risk Decision regarding hospitalization.   Patient is here with shortness of breath, referred for admission by cardiology for concern for possible ischemic heart disease.  EKG personally reviewed and interpreted showing left bundle branch block, unclear if this is new as there are no prior EKGs.  There are some ST elevations but does not meet Sgarbossa criteria for STEMI.  Cardiology team is already evaluated the patient and is planning for cardiac catheterization this afternoon.  Patient has been kept n.p.o. here.  Currently she is pain-free and stable on room air, not requiring oxygen.  Further labs and medical management to be provided by cardiology team.        Final Clinical Impression(s) / ED Diagnoses Final diagnoses:  Dyspnea, unspecified type    Rx / DC Orders ED Discharge Orders     None         Eloyse Causey, Carola Rhine, MD 10/05/21 1656

## 2021-10-05 NOTE — Progress Notes (Signed)
Cardiology Office Note:    Date:  10/05/2021   ID:  Stacey Hartman, DOB Feb 22, 1956, MRN [id number]  PCP:  Vidal Schwalbe, MD  Cardiologist:  Berniece Salines, DO  Electrophysiologist:  None   Referring MD: [name], MD   " I am   History of Present Illness:    Stacey Hartman is a 66 y.o. female with a hx of GERD, hypothyroidism ormer smoker quit smoking in 2003, reported persistent shortness of breath during that time she was sent to see the pulmonologist who set her up for PFTs.  She tells me that in the meantime the shortness of breath got progressively worse.  Her PCP then sent her for a echocardiogram.  She is here today because she was referred urgently by her PCP given the fact that her heart function was significantly depressed showing ejection fraction of less than 20%.  She tells me that she does have intermittent chest discomfort at times but the shortness of breath is actually her worst symptoms.  Recently over the last week or 2 she noticed that she is experiencing significant symptoms when she lie down to sleep.  She has to sit up at times when she is sleeping.  She is really concerned about this.  She states to me today" please I don't want to go home to die, I can't sleep at night"  No past medical history on file.  No past surgical history on file.  Current Medications: Current Meds  Medication Sig   acyclovir (ZOVIRAX) 800 MG tablet Take 800 mg by mouth as needed.   esomeprazole (NEXIUM) 20 MG capsule Take 1 capsule by mouth daily.   gabapentin (NEURONTIN) 300 MG capsule Take by mouth. Take one tablet at bedtime daily.   levothyroxine (SYNTHROID) 150 MCG tablet      Allergies:   Penicillins   Social History   Socioeconomic History   Marital status: Unknown    Spouse name: Not on file   Number of children: Not on file   Years of education: Not on file   Highest education level: Not on file  Occupational History   Not on file  Tobacco Use   Smoking  status: Former   Smokeless tobacco: Never  Substance and Sexual Activity   Alcohol use: Not Currently   Drug use: Never   Sexual activity: Not on file  Other Topics Concern   Not on file  Social History Narrative   Not on file   Social Determinants of Health   Financial Resource Strain: Not on file  Food Insecurity: Not on file  Transportation Needs: Not on file  Physical Activity: Not on file  Stress: Not on file  Social Connections: Not on file     Family History: The patient's family history is not on file.  ROS:   Review of Systems  Constitution: Negative for decreased appetite, fever and weight gain.  HENT: Negative for congestion, ear discharge, hoarse voice and sore throat.   Eyes: Negative for discharge, redness, vision loss in right eye and visual halos.  Cardiovascular: Negative for chest pain, dyspnea on exertion, leg swelling, orthopnea and palpitations.  Respiratory: Negative for cough, hemoptysis, shortness of breath and snoring.   Endocrine: Negative for heat intolerance and polyphagia.  Hematologic/Lymphatic: Negative for bleeding problem. Does not bruise/bleed easily.  Skin: Negative for flushing, nail changes, rash and suspicious lesions.  Musculoskeletal: Negative for arthritis, joint pain, muscle cramps, myalgias, neck pain and stiffness.  Gastrointestinal: Negative for  abdominal pain, bowel incontinence, diarrhea and excessive appetite.  Genitourinary: Negative for decreased libido, genital sores and incomplete emptying.  Neurological: Negative for brief paralysis, focal weakness, headaches and loss of balance.  Psychiatric/Behavioral: Negative for altered mental status, depression and suicidal ideas.  Allergic/Immunologic: Negative for HIV exposure and persistent infections.    EKGs/Labs/Other Studies Reviewed:    The following studies were reviewed today:   EKG:  The ekg ordered today demonstrates sinus rhythm, heart rate 81 bpm with underlying  left bundle branch block.  No prior EKG for comparison.  TTE 10/01/2021 IMPRESSIONS   1. Left ventricular ejection fraction, by estimation, is <20%. The left ventricle has severely decreased function. The left ventricle demonstrates global hypokinesis. The left ventricular internal cavity size was moderately dilated. Left ventricular  diastolic parameters are consistent with Grade I diastolic dysfunction (impaired relaxation).   2. Right ventricular systolic function is normal. The right ventricular size is normal. There is mildly elevated pulmonary artery systolic pressure. The estimated right ventricular systolic pressure is 02.7 mmHg.   3. Left atrial size was moderately dilated.   4. Right atrial size was moderately dilated.   5. The mitral valve is degenerative. Mild to moderate mitral valve regurgitation. No evidence of mitral stenosis.   6. The aortic valve is normal in structure. Aortic valve regurgitation is not visualized. No aortic stenosis is present.   7. The inferior vena cava is normal in size with greater than 50%  respiratory variability, suggesting right atrial pressure of 3 mmHg.   FINDINGS   Left Ventricle: Left ventricular ejection fraction, by estimation, is <20%. The left ventricle has severely decreased function. The left ventricle demonstrates global hypokinesis. The left ventricular internal  cavity size was moderately dilated. There  is no left ventricular hypertrophy. Left ventricular diastolic parameters are consistent with Grade I diastolic dysfunction (impaired relaxation).   Right Ventricle: The right ventricular size is normal. No increase in  right ventricular wall thickness. Right ventricular systolic function is  normal. There is mildly elevated pulmonary artery systolic pressure. The  tricuspid regurgitant velocity is 3.04   m/s, and with an assumed right atrial pressure of 8 mmHg, the estimated  right ventricular systolic pressure is 25.3 mmHg.   Left  Atrium: Left atrial size was moderately dilated.   Right Atrium: Right atrial size was moderately dilated.   Pericardium: There is no evidence of pericardial effusion.   Mitral Valve: The mitral valve is degenerative in appearance. There is  mild thickening of the mitral valve leaflet(s). There is mild  calcification of the mitral valve leaflet(s). Mild to moderate mitral  valve regurgitation, with posteriorly-directed jet.   No evidence of mitral valve stenosis. MV peak gradient, 6.0 mmHg. The  mean mitral valve gradient is 3.0 mmHg.   Tricuspid Valve: The tricuspid valve is normal in structure. Tricuspid  valve regurgitation is mild . No evidence of tricuspid stenosis.   Aortic Valve: The aortic valve is normal in structure. Aortic valve regurgitation is not visualized. No aortic stenosis is present. Aortic valve mean gradient measures 3.0 mmHg. Aortic valve peak gradient measures 5.2 mmHg. Aortic valve area, by VTI measures 2.22 cm.   Pulmonic Valve: The pulmonic valve was normal in structure. Pulmonic valve regurgitation is not visualized. No evidence of pulmonic stenosis.   Aorta: The aortic root is normal in size and structure.   Venous: The inferior vena cava is normal in size with greater than 50% respiratory variability, suggesting right atrial pressure of 3  mmHg.   IAS/Shunts: No atrial level shunt detected by color flow Doppler.    Recent Labs: 08/25/2021: BNP 442.8; BUN 15; Creatinine, Ser 0.80; Hemoglobin 13.2; Platelets 193; Potassium 4.7; Sodium 147  Recent Lipid Panel No results found for: CHOL, TRIG, HDL, CHOLHDL, VLDL, LDLCALC, LDLDIRECT  Physical Exam:    VS:  BP 120/84    Pulse 81    Ht 5' 3.5" (1.613 m)    Wt 125 lb 6.4 oz (56.9 kg)    LMP  (LMP Unknown)    SpO2 99%    BMI 21.87 kg/m     Wt Readings from Last 3 Encounters:  10/05/21 125 lb 6.4 oz (56.9 kg)  08/25/21 128 lb (58.1 kg)  01/01/21 123 lb (55.8 kg)     GEN: Well nourished, well developed in  no acute distress HEENT: Normal NECK: No JVD; No carotid bruits LYMPHATICS: No lymphadenopathy CARDIAC: S1S2 noted,RRR, no murmurs, rubs, gallops RESPIRATORY:  Clear to auscultation without rales, wheezing or rhonchi  ABDOMEN: Soft, non-tender, non-distended, +bowel sounds, no guarding. EXTREMITIES: No edema, No cyanosis, no clubbing MUSCULOSKELETAL:  No deformity  SKIN: Warm and dry NEUROLOGIC:  Alert and oriented x 3, non-focal PSYCHIATRIC:  Normal affect, good insight  ASSESSMENT:    1. DOE (dyspnea on exertion)   2. Depressed left ventricular ejection fraction   3. Nonrheumatic mitral valve regurgitation   4. Pulmonary hypertension, unspecified ([place])   5. LBBB (left bundle branch block)    PLAN:    Clinically I am concerned about acute exacerbation of systolic heart failure in the setting of her newly severely depressed ejection fraction.  I think will be best for the patient to be evaluated in the hospital as well as treated for exacerbation of heart failure.  She will need a right and left heart catheterization as part of her work-up.  It will be in her best interest for her to be seen by our heart failure team.  We will try to secure direct admission however this cannot be possible at this time was directed the patient to go to the emergency department for further evaluation work-up and hopefully hospital admission.  Mild to moderate mitral regurgitation noted on echocardiogram as well as pulmonary hypertension plan noted above.  EKG also showed evidence of left bundle branch block which I explained to the patient and her significant other what this means.  No prior EKG for comparison.   The patient is in agreement with the above plan. The patient left the office in stable condition.  The patient will follow up in   Medication Adjustments/Labs and Tests Ordered: Current medicines are reviewed at length with the patient today.  Concerns regarding medicines are outlined  above.  Orders Placed This Encounter  Procedures   EKG 12-Lead   No orders of the defined types were placed in this encounter.   Patient Instructions  Medication Instructions:  Your physician recommends that you continue on your current medications as directed. Please refer to the Current Medication list given to you today.  *If you need a refill on your cardiac medications before your next appointment, please call your pharmacy*   Lab Work: None If you have labs (blood work) drawn today and your tests are completely normal, you will receive your results only by: [facility] (if you have MyChart) OR A paper copy in the mail If you have any lab test that is abnormal or we need to change your treatment, we will call you to  review the results.   Testing/Procedures: None   Follow-Up: At [facility], you and your health needs are our priority.  As part of our continuing mission to provide you with exceptional heart care, we have created designated Provider Care Teams.  These Care Teams include your primary Cardiologist (physician) and Advanced Practice Providers (APPs -  Physician Assistants and Nurse Practitioners) who all work together to provide you with the care you need, when you need it.  We recommend signing up for the patient portal called "MyChart".  Sign up information is provided on this After Visit Summary.  MyChart is used to connect with patients for Virtual Visits (Telemedicine).  Patients are able to view lab/test results, encounter notes, upcoming appointments, etc.  Non-urgent messages can be sent to your provider as well.   To learn more about what you can do with MyChart, go to [name].    Your next appointment:   Return visit will be set up at the time of discharge.   The format for your next appointment:   In Person  Provider:   Berniece Salines, DO     Other Instructions Please go directly to the Emergency Department.     Adopting a  Healthy Lifestyle.  Know what a healthy weight is for you (roughly BMI <25) and aim to maintain this   Aim for 7+ servings of fruits and vegetables daily   65-80+ fluid ounces of water or unsweet tea for healthy kidneys   Limit to max 1 drink of alcohol per day; avoid smoking/tobacco   Limit animal fats in diet for cholesterol and heart health - choose grass fed whenever available   Avoid highly processed foods, and foods high in saturated/trans fats   Aim for low stress - take time to unwind and care for your mental health   Aim for 150 min of moderate intensity exercise weekly for heart health, and weights twice weekly for bone health   Aim for 7-9 hours of sleep daily   When it comes to diets, agreement about the perfect plan isnt easy to find, even among the experts. Experts at the [employer] developed an idea known as the Healthy Eating Plate. Just imagine a plate divided into logical, healthy portions.   The emphasis is on diet quality:   Load up on vegetables and fruits - one-half of your plate: Aim for color and variety, and remember that potatoes dont count.   Go for whole grains - one-quarter of your plate: Whole wheat, barley, wheat berries, quinoa, oats, brown rice, and foods made with them. If you want pasta, go with whole wheat pasta.   Protein power - one-quarter of your plate: Fish, chicken, beans, and nuts are all healthy, versatile protein sources. Limit red meat.   The diet, however, does go beyond the plate, offering a few other suggestions.   Use healthy plant oils, such as olive, canola, soy, corn, sunflower and peanut. Check the labels, and avoid partially hydrogenated oil, which have unhealthy trans fats.   If youre thirsty, drink water. Coffee and tea are good in moderation, but skip sugary drinks and limit milk and dairy products to one or two daily servings.   The type of carbohydrate in the diet is more important than the  amount. Some sources of carbohydrates, such as vegetables, fruits, whole grains, and beans-are healthier than others.   Finally, stay active  Signed, Berniece Salines, DO  10/05/2021 3:00 PM    [facility]

## 2021-10-05 NOTE — Patient Instructions (Signed)
Medication Instructions:  Your physician recommends that you continue on your current medications as directed. Please refer to the Current Medication list given to you today.  *If you need a refill on your cardiac medications before your next appointment, please call your pharmacy*   Lab Work: None If you have labs (blood work) drawn today and your tests are completely normal, you will receive your results only by: [facility] (if you have MyChart) OR A paper copy in the mail If you have any lab test that is abnormal or we need to change your treatment, we will call you to review the results.   Testing/Procedures: None   Follow-Up: At [facility], you and your health needs are our priority.  As part of our continuing mission to provide you with exceptional heart care, we have created designated Provider Care Teams.  These Care Teams include your primary Cardiologist (physician) and Advanced Practice Providers (APPs -  Physician Assistants and Nurse Practitioners) who all work together to provide you with the care you need, when you need it.  We recommend signing up for the patient portal called "MyChart".  Sign up information is provided on this After Visit Summary.  MyChart is used to connect with patients for Virtual Visits (Telemedicine).  Patients are able to view lab/test results, encounter notes, upcoming appointments, etc.  Non-urgent messages can be sent to your provider as well.   To learn more about what you can do with MyChart, go to [name].    Your next appointment:   Return visit will be set up at the time of discharge.   The format for your next appointment:   In Person  Provider:   Berniece Salines, DO     Other Instructions Please go directly to the Emergency Department.

## 2021-10-05 NOTE — Progress Notes (Signed)
ANTICOAGULATION CONSULT NOTE  Pharmacy Consult for Heparin Indication: chest pain/ACS  Allergies  Allergen Reactions   Penicillins     Trouble breathing     Patient Measurements: Height: 5' 0.35" (153.3 cm) Weight: 56.9 kg (125 lb 7.1 oz) IBW/kg (Calculated) : 46.31 Heparin Dosing Weight: 56.9 kg  Vital Signs: Temp: 98 F (36.7 C) (02/06 1524) Temp Source: Oral (02/06 1524) BP: 123/83 (02/06 1807) Pulse Rate: 91 (02/06 1807)  Labs: Recent Labs    10/05/21 1551 10/05/21 1655 10/05/21 1717 10/05/21 1734  HGB 12.9  --  13.9 13.6  HCT 39.8  --  41.0 40.0  PLT 157  --   --   --   LABPROT  --  13.9  --   --   INR  --  1.1  --   --   CREATININE 0.74  --  0.60  --   TROPONINIHS 9  --   --   --     Estimated Creatinine Clearance: 55.9 mL/min (by C-G formula based on SCr of 0.6 mg/dL).   Medical History: Past Medical History:  Diagnosis Date   Hypothyroid     Medications:  Medications Prior to Admission  Medication Sig Dispense Refill Last Dose   acyclovir (ZOVIRAX) 800 MG tablet Take 800 mg by mouth as needed.      esomeprazole (NEXIUM) 20 MG capsule Take 1 capsule by mouth daily.      gabapentin (NEURONTIN) 300 MG capsule Take by mouth. Take one tablet at bedtime daily.      levothyroxine (SYNTHROID) 150 MCG tablet        Scheduled:   [MAR Hold] heparin  3,000 Units Intravenous Once   [MAR Hold] sodium chloride flush  3 mL Intravenous Q12H   spironolactone  12.5 mg Oral Daily   Infusions:   heparin     PRN:   Assessment: 42 yof with a history of GERD, hypothyroidism, f& former smoker. Patient is presenting with SOB and new severe HF. Heparin per pharmacy consult placed for chest pain/ACS. Cath done this afternoon, report pending. It does not appear that any intervention was done. Orders to resume heparin in am.   Goal of Therapy:  Heparin level 0.3-0.7 units/ml Monitor platelets by anticoagulation protocol: Yes   Plan:  Restart heparin infusion at  700 units/hr Check anti-Xa level in 8 hours and daily while on heparin Continue to monitor H&H and platelets  Erin Hearing PharmD., BCPS Clinical Pharmacist 10/05/2021 6:29 PM

## 2021-10-05 NOTE — H&P (Addendum)
Advanced Heart Failure Team History and Physical Note   PCP:  Vidal Schwalbe, MD  PCP-Cardiology: Berniece Salines, DO     Reason for Admission: Chest Pain and Acute HFrEF    HPI:   Stacey Hartman is a 66 year old with a history of hypothyroidism, former tobacco abuse (quit 20 years ago), COPD, and last week had ECHO with severely reduced EF.   Father and brother died from heart attack.   Saw PCP for increased shortness of breath. PCP sent her for ECHO and to Pulmonary. Saw Dr [name] and placed on Echo EF < 20% RV normal. Saw Dr Harriet Masson today and due to increased shortness of breath sent to ED. Endorses intermittent chest pain. Progressive in nature. SOB with exertion. Had chest pain and shortness of breath in the waiting room/triage room.   Review of Systems: [y] = yes, [ ]  = no   General: Weight gain [ ] ; Weight loss [ ] ; Anorexia [ ] ; Fatigue [ Y]; Fever [ ] ; Chills [ ] ; Weakness [ ]   Cardiac: Chest pain/pressure [ Y]; Resting SOB [ ] ; Exertional SOB [ Y]; Orthopnea [ ] ; Pedal Edema [ Y]; Palpitations [ ] ; Syncope [ ] ; Presyncope [ ] ; Paroxysmal nocturnal dyspnea[ ]   Pulmonary: Cough [ ] ; Wheezing[ ] ; Hemoptysis[ ] ; Sputum [ ] ; Snoring [ ]   GI: Vomiting[ ] ; Dysphagia[ ] ; Melena[ ] ; Hematochezia [ ] ; Heartburn[ ] ; Abdominal pain [ ] ; Constipation [ ] ; Diarrhea [ ] ; BRBPR [ ]   GU: Hematuria[ ] ; Dysuria [ ] ; Nocturia[ ]   Vascular: Pain in legs with walking [ ] ; Pain in feet with lying flat [ ] ; Non-healing sores [ ] ; Stroke [ ] ; TIA [ ] ; Slurred speech [ ] ;  Neuro: Headaches[ ] ; Vertigo[ ] ; Seizures[ ] ; Paresthesias[ ] ;Blurred vision [ ] ; Diplopia [ ] ; Vision changes [ ]   Ortho/Skin: Arthritis [ ] ; Joint pain [ ] ; Muscle pain [ ] ; Joint swelling [ ] ; Back Pain [ ] ; Rash [ ]   Psych: Depression[ ] ; Anxiety[ ]   Heme: Bleeding problems [ ] ; Clotting disorders [ ] ; Anemia [ ]   Endocrine: Diabetes [ ] ; Thyroid dysfunction[ Y]   Home Medications Prior to Admission medications   Medication Sig Start  Date End Date Taking? Authorizing Provider  acyclovir (ZOVIRAX) 800 MG tablet Take 800 mg by mouth as needed. 12/01/20   [provider]  esomeprazole (NEXIUM) 20 MG capsule Take 1 capsule by mouth daily.    [provider]  gabapentin (NEURONTIN) 300 MG capsule Take by mouth. Take one tablet at bedtime daily. 12/23/11   [provider]  levothyroxine (SYNTHROID) 150 MCG tablet  07/01/20   [provider]    Past Medical History: History reviewed. No pertinent past medical history.  Past Surgical History: History reviewed. No pertinent surgical history.  Family History:  History reviewed. No pertinent family history.  Social History: Social History   Socioeconomic History   Marital status: Unknown    Spouse name: Not on file   Number of children: Not on file   Years of education: Not on file   Highest education level: Not on file  Occupational History   Not on file  Tobacco Use   Smoking status: Former   Smokeless tobacco: Never  Substance and Sexual Activity   Alcohol use: Not Currently   Drug use: Never   Sexual activity: Not on file  Other Topics Concern   Not on file  Social History Narrative   Not on file  Social Determinants of Health   Financial Resource Strain: Not on file  Food Insecurity: Not on file  Transportation Needs: Not on file  Physical Activity: Not on file  Stress: Not on file  Social Connections: Not on file    Allergies:  Allergies  Allergen Reactions   Penicillins     Trouble breathing     Objective:    Vital Signs:   Temp:  [98 F (36.7 C)] 98 F (36.7 C) (02/06 1524) Pulse Rate:  [96-103] 96 (02/06 1525) Resp:  [16] 16 (02/06 1525) BP: (129-132)/(81-99) 129/81 (02/06 1525) SpO2:  [100 %] 100 % (02/06 1525) Weight:  [56.9 kg] 56.9 kg (02/06 1536)   Filed Weights   10/05/21 1536  Weight: 56.9 kg     Physical Exam     General: No respiratory difficulty. Appears weak HEENT: Normal Neck:  Supple. JVP 10-11  Carotids 2+ bilat; no bruits. No lymphadenopathy or thyromegaly appreciated. Cor: PMI nondisplaced. Regular rate & rhythm. No rubs, gallops or murmurs. Lungs: Clear Abdomen: Soft, nontender, nondistended. No hepatosplenomegaly. No bruits or masses. Good bowel sounds. Extremities: No cyanosis, clubbing, rash, edema Neuro: Alert & oriented x 3, cranial nerves grossly intact. moves all 4 extremities w/o difficulty. Affect flat  Telemetry  SR  EKG   SR with LBBB  QRS 170 Stacey  Labs     Basic Metabolic Panel: No results for input(s): NA, K, CL, CO2, GLUCOSE, BUN, CREATININE, CALCIUM, MG, PHOS in the last 168 hours.  Liver Function Tests: No results for input(s): AST, ALT, ALKPHOS, BILITOT, PROT, ALBUMIN in the last 168 hours. No results for input(s): LIPASE, AMYLASE in the last 168 hours. No results for input(s): AMMONIA in the last 168 hours.  CBC: No results for input(s): WBC, NEUTROABS, HGB, HCT, MCV, PLT in the last 168 hours.  Cardiac Enzymes: No results for input(s): CKTOTAL, CKMB, CKMBINDEX, TROPONINI in the last 168 hours.  BNP: BNP (last 3 results) Recent Labs    08/25/21 1516  BNP 442.8*    ProBNP (last 3 results) No results for input(s): PROBNP in the last 8760 hours.   CBG: No results for input(s): GLUCAP in the last 168 hours.  Coagulation Studies: No results for input(s): LABPROT, INR in the last 72 hours.  Imaging: No results found.   Patient Profile  Stacey Hartman is a 66 year old with a history of hypothyroidism, former tobacco abuse (quit 20 years ago), COPD, and last week had ECHO with severely reduced EF.   Presenting with progressive chest pain and increased shortness of breath.    Assessment/Plan   Chest Pain  -Progressive chest pain /shortness of breath over the last few weeks. H/O tobacco abuse. LBBB. Father/brother MI in 47s.  -Place on heparin drip.  - Needs LHC/RHC now.   2. Acute HFrEF  -10/01/21 Echo Ef < 20% .  Concerned this ICM  - Give 40 mg IV lasix now.  - Will need to address GDMT post cath.   3. LBBB   4. Hypothyroidism  Check TSH   5. COPD   6. Former [place], NP 10/05/2021, 4:14 PM  Advanced Heart Failure Team Pager [phone] (M-F; 7a - 5p)  Please contact [facility] for night-coverage after hours (4p -7a ) and weekends on [email or web address]  Patient seen with NP, agree with the above note.   Patient reports a number of weeks of progressive exertional dyspnea.  Now short of breath at rest (today). She  has been having significant orthopnea.  She also reports episodes of chest tightness on the left.  She has had 4-5 episodes in the last couple of weeks.  She had one last night that lasted for about 15 minutes.  She had another episode of chest pain for 15-20 minutes after arriving in the ER. ECG shows NSR with wide LBBB, not sure how long this has been present (no prior ECGs).  Echo was done last week, EF was < 20% with diffuse hypokinesis and normal RV.   General: NAD Neck: JVP 12+ cm, no thyromegaly or thyroid nodule.  Lungs: Crackles at bases.  CV: Nondisplaced PMI.  Heart regular S1/S2, no S3/S4, no murmur.  No peripheral edema.  No carotid bruit.  Difficult to palpate pedal pulses.  Abdomen: Soft, nontender, no hepatosplenomegaly, no distention.  Skin: Intact without lesions or rashes.  Neurologic: Alert and oriented x 3.  Psych: Normal affect. Extremities: No clubbing or cyanosis.  HEENT: Normal.   1. Acute systolic CHF: Echo done last week showed EF < 20% with diffuse hypokinesis and normal RV.  She is markedly short of breath and looks volume overloaded.  Suspect ischemic cardiomyopathy based on history.  - Lasix 40 mg IV x 1 now.  - Needs right/left heart cath.  Given chest pain, will do today.  2. CAD: Chest pain with LBBB of uncertain duration (no prior ECG).  Strong suspicion for ischemic cardiomyopathy with low EF.   - Should get coronary angiography  today.  - ASA, statin, heparin.  - Cycle troponin.   Loralie Champagne 10/05/2021 5:08 PM

## 2021-10-05 NOTE — ED Provider Notes (Signed)
Patient is here for new severe CHF. Heart failure NP at bedside.  Patient endorses on and off chest pain for quite some time.  She feels like her heart is beating fast in her chest but not really a pain.  Dr. Marigene Ehlers has reviewed the ECG and it seems to be a left bundle without STEMI criteria.   Sherwood Gambler, MD 10/05/21 [phone]

## 2021-10-05 NOTE — ED Triage Notes (Signed)
Pt arrives POV for eval of chest pain/SOB. Sent from cardiologist for possible acute exacerbation of new onset CHF. Pt reports CP and SOB x mos. Reports much worse in the last week. Recently done echo w/ severely decreased EF. Pt is tachypneic in triage, reports severe orthopnea

## 2021-10-05 NOTE — Progress Notes (Signed)
ANTICOAGULATION CONSULT NOTE - Initial Consult  Pharmacy Consult for Heparin Indication: chest pain/ACS  Allergies  Allergen Reactions   Penicillins     Trouble breathing     Patient Measurements: Height: 5' 0.35" (153.3 cm) Weight: 56.9 kg (125 lb 7.1 oz) IBW/kg (Calculated) : 46.31 Heparin Dosing Weight: 56.9 kg  Vital Signs: Temp: 98 F (36.7 C) (02/06 1524) Temp Source: Oral (02/06 1524) BP: 129/81 (02/06 1525) Pulse Rate: 96 (02/06 1525)  Labs: No results for input(s): HGB, HCT, PLT, APTT, LABPROT, INR, HEPARINUNFRC, HEPRLOWMOCWT, CREATININE, CKTOTAL, CKMB, TROPONINIHS in the last 72 hours.  CrCl cannot be calculated (Patient's most recent lab result is older than the maximum 21 days allowed.).   Medical History: History reviewed. No pertinent past medical history.  Medications:  (Not in a hospital admission)  Scheduled:   furosemide  40 mg Intravenous Once   sodium chloride flush  3 mL Intravenous Q12H   Infusions:  PRN:   Assessment: Stacey Hartman with a history of GERD, hypothyroidism, f& former smoker. Patient is presenting with SOB and new severe HF. Heparin per pharmacy consult placed for chest pain/ACS.  Patient is not on anticoagulation prior to arrival.  Hgb pending; plt pending  Goal of Therapy:  Heparin level 0.3-0.7 units/ml Monitor platelets by anticoagulation protocol: Yes   Plan:  Give 3000 units bolus x 1 Start heparin infusion at 700 units/hr Check anti-Xa level in 8 hours and daily while on heparin Continue to monitor H&H and platelets  Lorelei Pont, PharmD, BCPS 10/05/2021 4:01 PM ED Clinical Pharmacist -  [phone]

## 2021-10-06 ENCOUNTER — Encounter (HOSPITAL_COMMUNITY): Payer: Self-pay | Admitting: Cardiology

## 2021-10-06 ENCOUNTER — Inpatient Hospital Stay (HOSPITAL_COMMUNITY): Payer: 59

## 2021-10-06 ENCOUNTER — Other Ambulatory Visit (HOSPITAL_COMMUNITY): Payer: Self-pay

## 2021-10-06 DIAGNOSIS — I5021 Acute systolic (congestive) heart failure: Secondary | ICD-10-CM

## 2021-10-06 DIAGNOSIS — I361 Nonrheumatic tricuspid (valve) insufficiency: Secondary | ICD-10-CM

## 2021-10-06 DIAGNOSIS — I340 Nonrheumatic mitral (valve) insufficiency: Secondary | ICD-10-CM

## 2021-10-06 LAB — BASIC METABOLIC PANEL
Anion gap: 9 (ref 5–15)
BUN: 11 mg/dL (ref 8–23)
CO2: 25 mmol/L (ref 22–32)
Calcium: 9.1 mg/dL (ref 8.9–10.3)
Chloride: 106 mmol/L (ref 98–111)
Creatinine, Ser: 0.68 mg/dL (ref 0.44–1.00)
GFR, Estimated: >60 mL/min (ref >60)
Glucose, Bld: 96 mg/dL (ref 70–99)
Potassium: 3.6 mmol/L (ref 3.5–5.1)
Sodium: 140 mmol/L (ref 135–145)

## 2021-10-06 LAB — CBC
HCT: 38.4 % (ref 36.0–46.0)
Hemoglobin: 12.8 g/dL (ref 12.0–15.0)
MCH: 30.6 pg (ref 26.0–34.0)
MCHC: 33.3 g/dL (ref 30.0–36.0)
MCV: 91.9 fL (ref 80.0–100.0)
Platelets: 155 10*3/uL (ref 150–400)
RBC: 4.18 MIL/uL (ref 3.87–5.11)
RDW: 13.9 % (ref 11.5–15.5)
WBC: 7.8 10*3/uL (ref 4.0–10.5)
nRBC: 0 % (ref 0.0–0.2)

## 2021-10-06 LAB — ECHOCARDIOGRAM LIMITED
AV Peak grad: 3.9 mmHg
Ao pk vel: 0.99 m/s
Area-P 1/2: 4.24 cm2
Height: 60.35 in
S' Lateral: 5.9 cm
Weight: 1936 oz

## 2021-10-06 LAB — TSH: TSH: 18.03 u[IU]/mL — ABNORMAL HIGH (ref 0.350–4.500)

## 2021-10-06 LAB — LIPID PANEL
Cholesterol: 158 mg/dL (ref 0–200)
HDL: 50 mg/dL (ref >40)
LDL Cholesterol: 89 mg/dL (ref 0–99)
Total CHOL/HDL Ratio: 3.2 RATIO
Triglycerides: 96 mg/dL (ref <150)
VLDL: 19 mg/dL (ref 0–40)

## 2021-10-06 LAB — HEMOGLOBIN A1C
Hgb A1c MFr Bld: 5.4 % (ref 4.8–5.6)
Mean Plasma Glucose: 108.28 mg/dL

## 2021-10-06 LAB — HEPARIN LEVEL (UNFRACTIONATED): Heparin Unfractionated: 0.1 IU/mL — ABNORMAL LOW (ref 0.30–0.70)

## 2021-10-06 LAB — MAGNESIUM: Magnesium: 2.2 mg/dL (ref 1.7–2.4)

## 2021-10-06 IMAGING — CT CT ANGIO CHEST
2 of 7 series · 18 of 46 positions shown · IV contrast (agent unspecified)
Comparison: None

CLINICAL DATA: Shortness of breath question pulmonary embolism,
clinically high suspicion

EXAM:
CT ANGIOGRAPHY CHEST WITH CONTRAST
TECHNIQUE: Multidetector CT imaging of the chest was performed using the
standard protocol during bolus administration of intravenous
contrast. Multiplanar CT image reconstructions and MIPs were
obtained to evaluate the vascular anatomy.

[Series 6: thins · axial · 0.66mm/px · z∈[+1272,+1515]mm · 15 of 391 slices shown]
[im 22/391  lung]
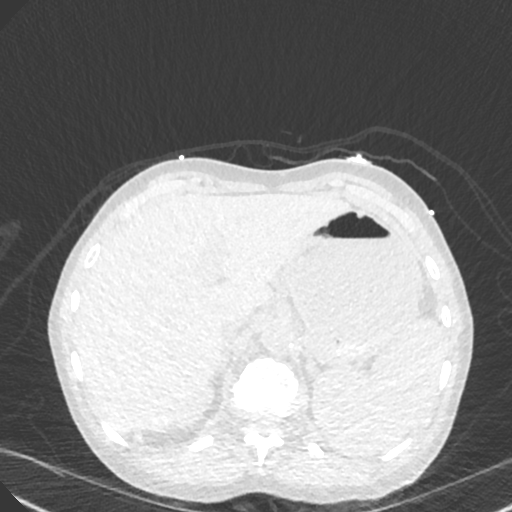
[im 44/391  soft-tissue]
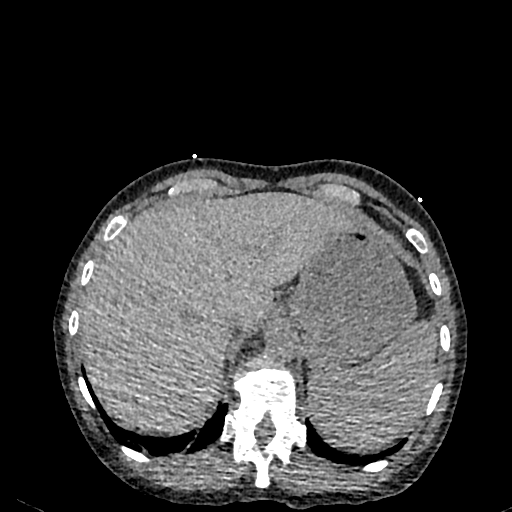
[im 66/391  lung]
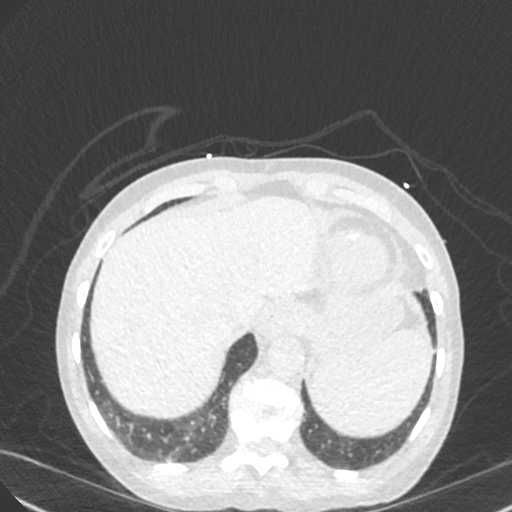
[im 87/391  soft-tissue]
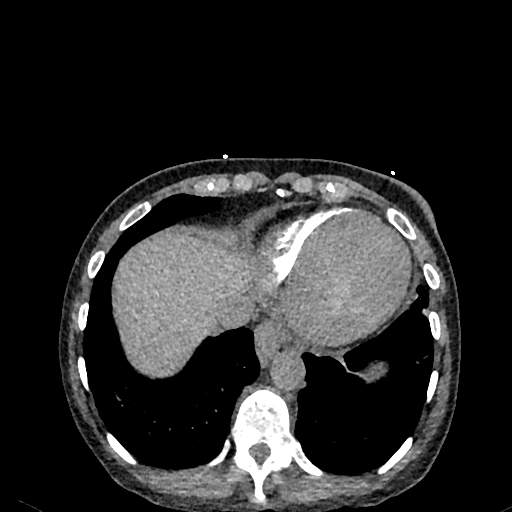
[im 131/391  lung]
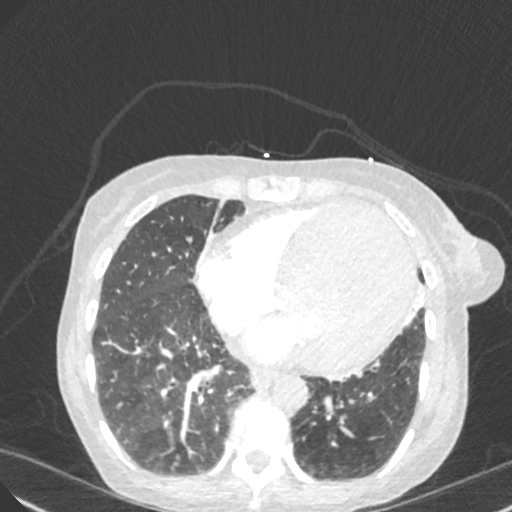
[im 152/391  soft-tissue]
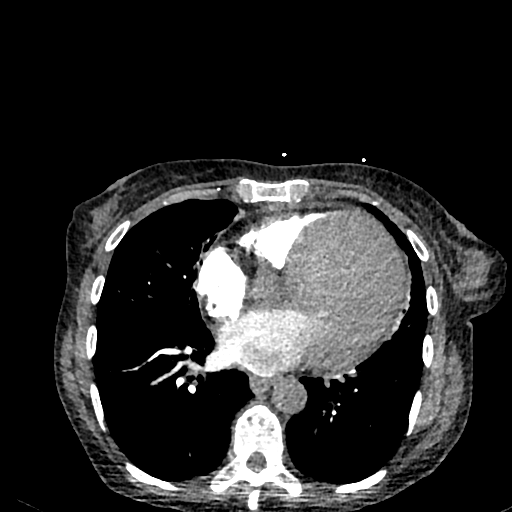
[im 174/391  lung]
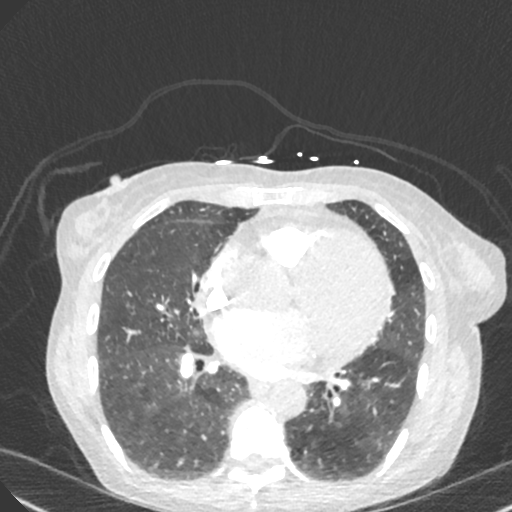
[im 196/391  soft-tissue]
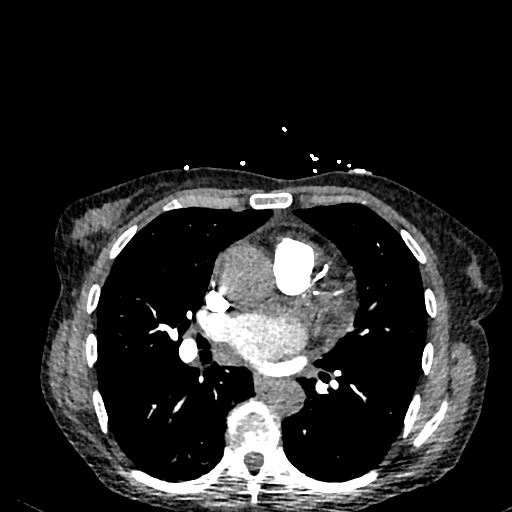
[im 217/391  lung]
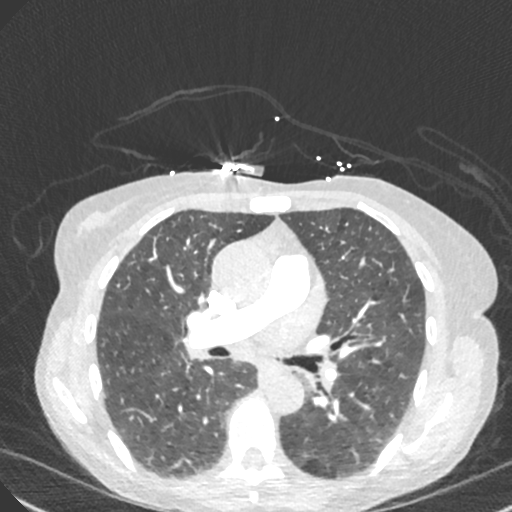
[im 239/391  soft-tissue]
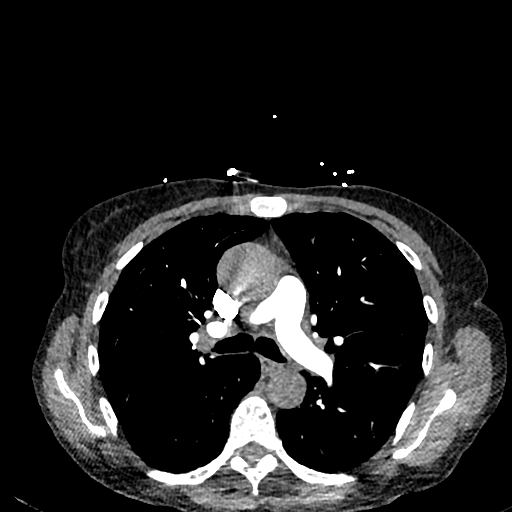
[im 261/391  lung]
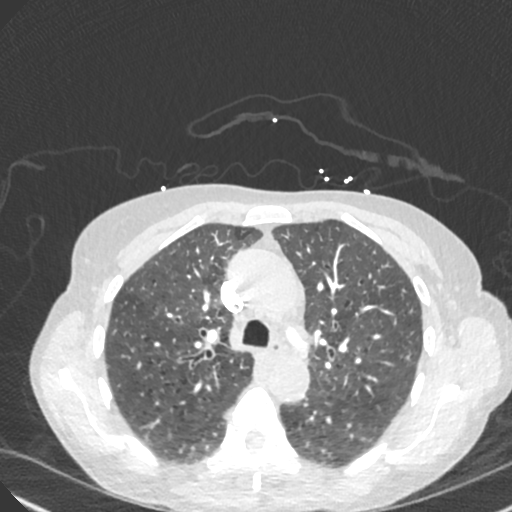
[im 304/391  soft-tissue]
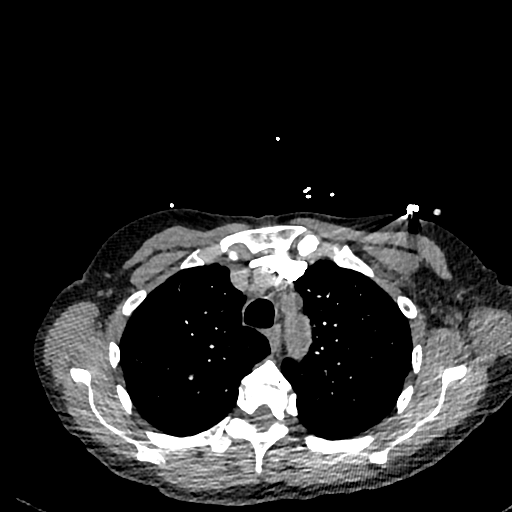
[im 326/391  lung]
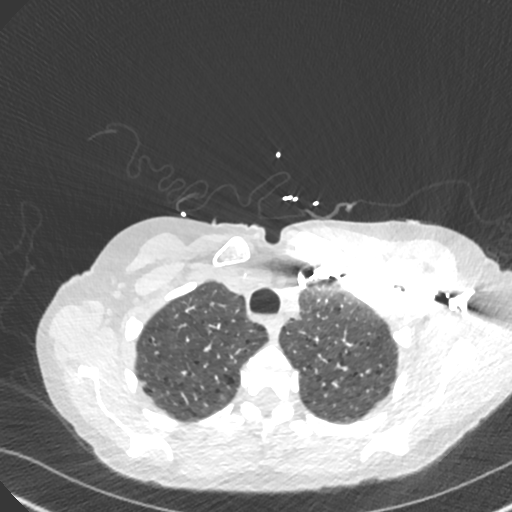
[im 347/391  soft-tissue]
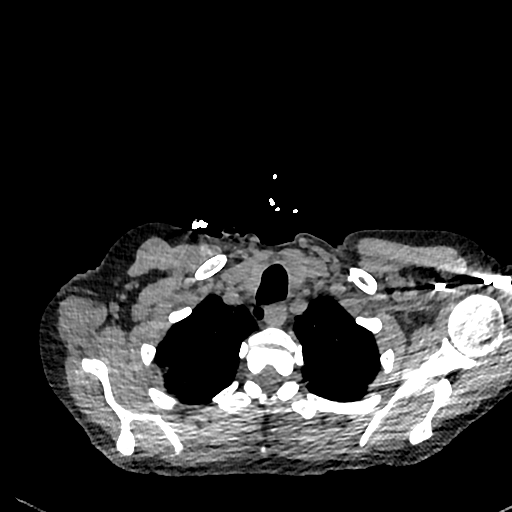
[im 369/391  lung]
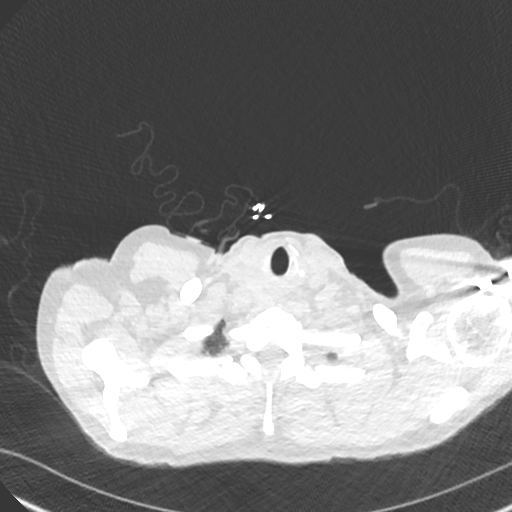

[Series 8: cor · coronal · 0.54mm/px · 3 of 124 slices shown]
[im 31/124  soft-tissue]
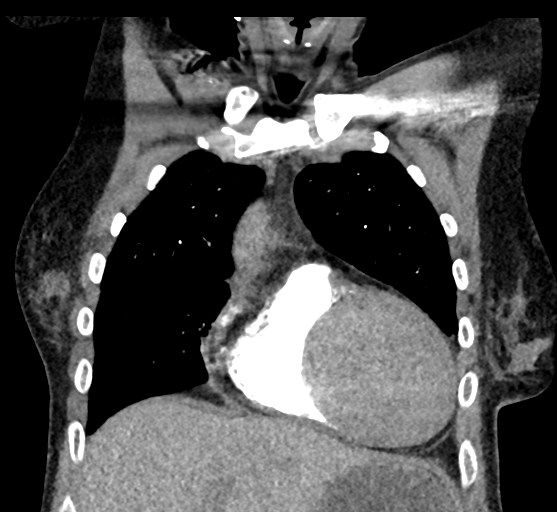
[im 62/124  soft-tissue]
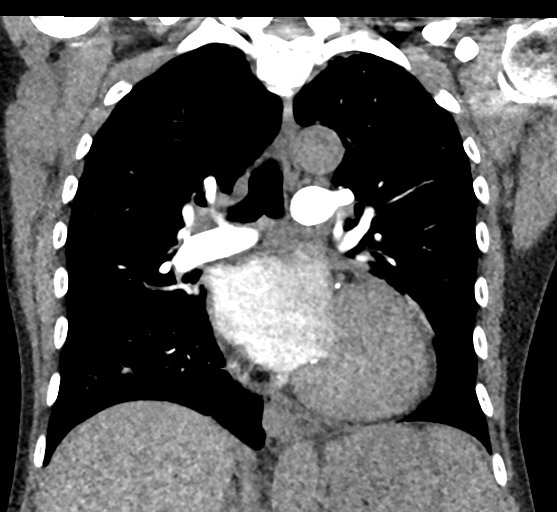
[im 93/124  soft-tissue]
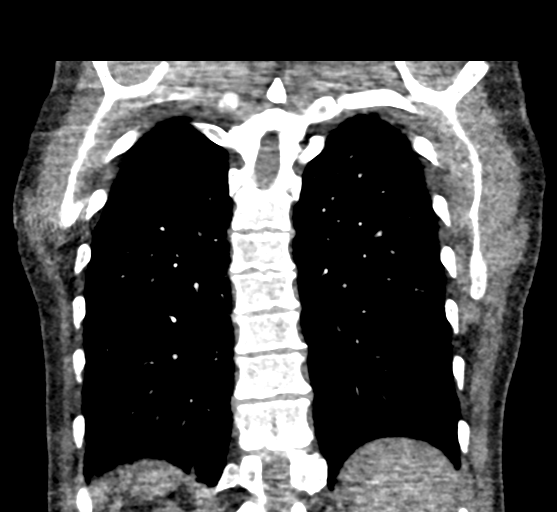

[18 of 46 positions shown; findings below may reference images not displayed]

RADIATION DOSE REDUCTION: This exam was performed according to the
departmental dose-optimization program which includes automated
exposure control, adjustment of the mA and/or kV according to
patient size and/or use of iterative reconstruction technique.

CONTRAST:  50mL OMNIPAQUE IOHEXOL 350 MG/ML SOLN IV
FINDINGS: Cardiovascular: Pulmonary arteries adequately opacified and patent.
No evidence of pulmonary embolism. Scattered atherosclerotic
calcifications aorta and coronary arteries. Aneurysmal dilatation
ascending thoracic aorta 4.0 cm transverse. Enlargement of cardiac
chambers especially LEFT ventricle. Minimal pericardial fluid.

Mediastinum/Nodes: Base of cervical region normal appearance.
Esophagus unremarkable. No thoracic adenopathy.

Lungs/Pleura: Emphysematous changes with central peribronchial
thickening consistent with COPD. Minimal nonspecific mosaic
attenuation in the lungs. Biapical scarring. No acute infiltrate,
pleural effusion, or pneumothorax. 4 mm RIGHT upper lobe nodule
image 42.

Upper Abdomen: Visualized upper abdomen unremarkable

Musculoskeletal: No acute osseous findings.

Review of the MIP images confirms the above findings.
IMPRESSION: No evidence of pulmonary embolism.

4 mm RIGHT upper lobe nodule; no follow-up needed if patient is
low-risk. Non-contrast chest CT can be considered in 12 months if
patient is high-risk. This recommendation follows the consensus
statement: Guidelines for Management of Incidental Pulmonary Nodules
Detected on CT Images: From the [HOSPITAL] [JI]; Radiology
[JI]; [DATE].

Enlargement of cardiac chambers especially LEFT ventricle.

Aneurysmal dilatation ascending thoracic aorta 4.0 cm transverse;
recommend annual imaging followup by CTA or MRA. This recommendation
follows [JI] ACCF/AHA/AATS/ACR/ASA/SCA/LUJAN/LUJAN/LUJAN/LUJAN Guidelines
for the Diagnosis and Management of Patients with Thoracic Aortic
Disease. Circulation. [JI]; 121: [id number]. Aortic aneurysm NOS
([JI]-[JI])

Aortic Atherosclerosis ([JI]-[JI])

Emphysema ([JI]-[JI]).

Aortic aneurysm NOS ([JI]-[JI]).

## 2021-10-06 MED ORDER — DAPAGLIFLOZIN PROPANEDIOL 10 MG PO TABS
10.0000 mg | ORAL_TABLET | Freq: Every day | ORAL | Status: DC
  Administered 2021-10-06 – 2021-10-09 (×4): 10 mg via ORAL
  Filled 2021-10-06 (×4): qty 1

## 2021-10-06 MED ORDER — MAGNESIUM HYDROXIDE 400 MG/5ML PO SUSP
30.0000 mL | Freq: Every day | ORAL | Status: DC | PRN
  Administered 2021-10-06 – 2021-10-07 (×2): 30 mL via ORAL
  Filled 2021-10-06 (×2): qty 30

## 2021-10-06 MED ORDER — IOHEXOL 350 MG/ML SOLN
100.0000 mL | Freq: Once | INTRAVENOUS | Status: AC | PRN
  Administered 2021-10-06: 50 mL via INTRAVENOUS

## 2021-10-06 MED ORDER — ENOXAPARIN SODIUM 40 MG/0.4ML IJ SOSY
40.0000 mg | PREFILLED_SYRINGE | INTRAMUSCULAR | Status: DC
  Administered 2021-10-06 – 2021-10-08 (×3): 40 mg via SUBCUTANEOUS
  Filled 2021-10-06 (×3): qty 0.4

## 2021-10-06 MED ORDER — GABAPENTIN 300 MG PO CAPS
300.0000 mg | ORAL_CAPSULE | Freq: Every day | ORAL | Status: DC
  Administered 2021-10-06 – 2021-10-08 (×3): 300 mg via ORAL
  Filled 2021-10-06 (×3): qty 1

## 2021-10-06 MED ORDER — POTASSIUM CHLORIDE CRYS ER 20 MEQ PO TBCR
40.0000 meq | EXTENDED_RELEASE_TABLET | Freq: Once | ORAL | Status: AC
  Administered 2021-10-06: 40 meq via ORAL
  Filled 2021-10-06: qty 2

## 2021-10-06 NOTE — TOC Benefit Eligibility Note (Signed)
Patient Teacher, English as a foreign language completed.    The patient is currently admitted and upon discharge could be taking Farxiga 10 mg.  The current 30 day co-pay is, $0.00.   The patient is currently admitted and upon discharge could be taking Jardiance 10 mg.  The current 30 day co-pay is, $0.00.   The patient is currently admitted and upon discharge could be taking Entresto 24-26 mg.  The current 30 day co-pay is, $0.00.   The patient is insured through [employer] [place] Patient Advocate Specialist [place] Patient Advocate Team Direct Number: [phone]  Fax: [phone]

## 2021-10-06 NOTE — Progress Notes (Addendum)
ANTICOAGULATION CONSULT NOTE  Pharmacy Consult for Heparin Indication: chest pain/ACS  Allergies  Allergen Reactions   Penicillins     Trouble breathing     Patient Measurements: Height: 5' 0.35" (153.3 cm) Weight: 54.9 kg (121 lb) IBW/kg (Calculated) : 46.31 Heparin Dosing Weight: 56.9 kg  Vital Signs: Temp: 97.7 F (36.5 C) (02/07 0735) Temp Source: Oral (02/07 0735) BP: 114/76 (02/07 0735) Pulse Rate: 83 (02/07 0735)  Labs: Recent Labs    10/05/21 1551 10/05/21 1655 10/05/21 1717 10/05/21 1730 10/05/21 1734 10/05/21 1902 10/06/21 0410 10/06/21 0812  HGB 12.9  --    < > 13.6 13.3   13.6 13.8 12.8  --   HCT 39.8  --    < > 40.0 39.0   40.0 40.4 38.4  --   PLT 157  --   --   --   --  176 155  --   LABPROT  --  13.9  --   --   --   --   --   --   INR  --  1.1  --   --   --   --   --   --   HEPARINUNFRC  --   --   --   --   --   --   --  0.10*  CREATININE 0.74  --    < > 0.60  --  0.68 0.68  --   TROPONINIHS 9  --   --   --   --  9  --   --    < > = values in this interval not displayed.     Estimated Creatinine Clearance: 51.2 mL/min (by C-G formula based on SCr of 0.68 mg/dL).   Medical History: Past Medical History:  Diagnosis Date   Hypothyroid     Medications:  Medications Prior to Admission  Medication Sig Dispense Refill Last Dose   esomeprazole (NEXIUM) 20 MG capsule Take 1 capsule by mouth daily.   Past Week   gabapentin (NEURONTIN) 300 MG capsule Take 300 mg by mouth at bedtime.   10/04/2021   levothyroxine (SYNTHROID) 150 MCG tablet Take 150 mcg by mouth daily before breakfast.   10/05/2021   Infusions:   sodium chloride     sodium chloride     heparin 700 Units/hr (10/06/21 0327)   PRN:   Assessment: 39 yof with a history of GERD, hypothyroidism, former smoker. Patient is presenting with SOB and new severe HF. Heparin per pharmacy consult placed for chest pain/ACS. Cath without significant CAD.  EF < 20%  S/p CT for ?PE - results  pending.  Current heparin level low at 0.1.  No known issues with IV infusion.  No overt bleeding or complications noted.  Goal of Therapy:  Heparin level 0.3-0.7 units/ml Monitor platelets by anticoagulation protocol: Yes   Plan:  Increase IV heparin to 850 units/hr Repeat heparin level in 8 hrs. F/u results of CT - if no PE, can stop heparin per Dr. Aundra Dubin.  [place] Crane, Roylene Reason, BCCP Clinical Pharmacist  10/06/2021 9:48 AM   [facility] pharmacy phone numbers are listed on [email or web address]  Addendum: CT negative for PE > will switch IV heparin to lovenox for DVT prophylaxis.  [place] Crane, Roylene Reason, [facility] Clinical Pharmacist  10/06/2021 10:41 AM   [facility] pharmacy phone numbers are listed on [email or web address]

## 2021-10-06 NOTE — Progress Notes (Addendum)
CARDIAC REHAB PHASE I   PRE:  Rate/Rhythm: 85 SR    BP: sitting 117/80    SaO2: 98 RA  MODE:  Ambulation: 130 ft   POST:  Rate/Rhythm: 87 SR    BP: sitting 103/87     SaO2: 98 RA  Pt slowly ambulated hall without real assist, short distance. Sts moderate SOB, not as bad as admit. Fatigue and diaphoresis toward end of 130 ft. VSS, no real change. To recliner. Left HF booklet. Will f/u tomorrow. [place], ACSM 10/06/2021 2:43 PM

## 2021-10-06 NOTE — Progress Notes (Signed)
Heart Failure Navigator Progress Note ° °Assessed for Heart & Vascular TOC clinic readiness.  °Patient does not meet criteria due to AHF rounding team consulted this hospitalization.  ° °Navigator available for educational resources. ° °Ellisha Bankson, MSN, RN °Heart Failure Nurse Navigator °[phone] ° ° °

## 2021-10-06 NOTE — Progress Notes (Addendum)
Advanced Heart Failure Rounding Note  PCP-Cardiologist: Kardie Tobb, DO   Subjective:     Chest pain resolved. Still has some dyspnea, trouble taking a deep breath but improved.  HS-TnI negative.  Notes 20 lb weight loss since salmonella infection August 2021. No other recent illness.   LHC/RHC:  Coronary Findings  Diagnostic Dominance: Right Left Main  Vessel was injected. Vessel is normal in caliber. Vessel is angiographically normal.    Left Anterior Descending  Vessel was injected. Vessel is normal in caliber. Vessel is angiographically normal.    Left Circumflex  Vessel was injected. Vessel is normal in caliber. Vessel is angiographically normal.    Right Coronary Artery  Vessel was injected. Vessel is normal in caliber. Vessel is angiographically normal.    Intervention   No interventions have been documented.   Right Heart  Right Heart Pressures RHC Procedural Findings: Hemodynamics (mmHg) RA mean 2 RV 22/1 PA 20/12, mean 13 PCWP mean 7 LV 125/26 AO 124/73  Oxygen saturations: PA 63% AO 97%  Cardiac Output (Fick) 3.42  Cardiac Index (Fick) 2.23  Cardiac Output (Thermo) 4.29 Cardiac Index (Thermo) 2.79     Objective:   Weight Range: 54.9 kg Body mass index is 23.36 kg/m.   Vital Signs:   Temp:  [97.6 F (36.4 C)-98.4 F (36.9 C)] 97.7 F (36.5 C) (02/07 0735) Pulse Rate:  [0-103] 83 (02/07 0735) Resp:  [15-32] 18 (02/07 0735) BP: (99-133)/(67-99) 114/76 (02/07 0735) SpO2:  [94 %-100 %] 96 % (02/07 0735) Weight:  [54.9 kg-56.9 kg] 54.9 kg (02/07 0426) Last BM Date: 10/04/21  Weight change: Filed Weights   10/05/21 1536 10/06/21 0426  Weight: 56.9 kg 54.9 kg    Intake/Output:   Intake/Output Summary (Last 24 hours) at 10/06/2021 0811 Last data filed at 10/06/2021 0732 Gross per 24 hour  Intake 252.3 ml  Output 700 ml  Net -447.7 ml      Physical Exam    General:  Thin female. No resp difficulty HEENT: Normal Neck:  Supple. No JVD. Carotids 2+ bilat; no bruits.  Cor: PMI nondisplaced. Regular rate & rhythm. No rubs, gallops or murmurs. Lungs: Diminished Abdomen: Soft, nontender, nondistended. No hepatosplenomegaly. No bruits or masses. Good bowel sounds. Extremities: No cyanosis, clubbing, rash, edema Neuro: Alert & orientedx3, cranial nerves grossly intact. moves all 4 extremities w/o difficulty. Affect pleasant   Telemetry   SR 70s-80s  Labs    CBC Recent Labs    10/05/21 1902 10/06/21 0410  WBC 8.3 7.8  HGB 13.8 12.8  HCT 40.4 38.4  MCV 92.2 91.9  PLT 176 540   Basic Metabolic Panel Recent Labs    10/05/21 1551 10/05/21 1717 10/05/21 1730 10/05/21 1734 10/05/21 1902 10/06/21 0410  NA 140   < > 142 142   142  --  140  K 3.7   < > 3.9 4.0   4.0  --  3.6  CL 105   < > 104  --   --  106  CO2 24  --   --   --   --  25  GLUCOSE 133*   < > 127*  --   --  96  BUN 8   < > 10  --   --  11  CREATININE 0.74   < > 0.60  --  0.68 0.68  CALCIUM 9.2  --   --   --   --  9.1  MG  --   --   --   --   --  2.2   < > = values in this interval not displayed.   Liver Function Tests Recent Labs    10/05/21 1551  AST 27  ALT 18  ALKPHOS 75  BILITOT 0.4  PROT 7.1  ALBUMIN 3.9   No results for input(s): LIPASE, AMYLASE in the last 72 hours. Cardiac Enzymes No results for input(s): CKTOTAL, CKMB, CKMBINDEX, TROPONINI in the last 72 hours.  BNP: BNP (last 3 results) Recent Labs    08/25/21 1516 10/05/21 1551  BNP 442.8* 635.9*    ProBNP (last 3 results) No results for input(s): PROBNP in the last 8760 hours.   D-Dimer No results for input(s): DDIMER in the last 72 hours. Hemoglobin A1C Recent Labs    10/06/21 0410  HGBA1C 5.4   Fasting Lipid Panel Recent Labs    10/06/21 0410  CHOL 158  HDL 50  LDLCALC 89  TRIG 96  CHOLHDL 3.2   Thyroid Function Tests No results for input(s): TSH, T4TOTAL, T3FREE, THYROIDAB in the last 72 hours.  Invalid input(s):  FREET3  Other results:   Imaging    DG Chest 2 View  Result Date: 10/05/2021 CLINICAL DATA:  Chest pain, shortness of breath EXAM: CHEST - 2 VIEW COMPARISON:  10/01/2021 FINDINGS: Stable cardiomediastinal contours. Aortic atherosclerosis. Diffuse bilateral interstitial prominence is similar to the previous study. Lungs are hyperexpanded. No new focal airspace consolidation. No pleural effusion or pneumothorax. Exaggerated thoracic kyphosis. IMPRESSION: Unchanged bilateral interstitial prominence, which may reflect bronchitic type lung changes. No new airspace consolidation. Electronically Signed   By: Davina Poke D.O.   On: 10/05/2021 16:51   CARDIAC CATHETERIZATION  Result Date: 10/05/2021 1. Normal RA pressure and PCWP though LVEDP was elevated.  PCWP was repeated and remained low. 2. Preserved cardiac output. 3. Normal PA pressure. 4. No significant coronary disease. Nonischemic cardiomyopathy.  This catheterization does not explain the patient's quite significant dyspnea and chest pain.  Will get CTA chest in the morning (had contrast with cath so will wait until tomorrow) to rule out PE (unlikely with normal troponin and PA pressure) and also evaluate lung fields.  Will cover with heparin gtt until then.     Medications:     Scheduled Medications:  carvedilol  3.125 mg Oral BID WC   levothyroxine  150 mcg Oral Q0600   sodium chloride flush  3 mL Intravenous Q12H   sodium chloride flush  3 mL Intravenous Q12H   sodium chloride flush  3 mL Intravenous Q12H   spironolactone  12.5 mg Oral Daily    Infusions:  sodium chloride     sodium chloride     heparin 700 Units/hr (10/06/21 0327)    PRN Medications: sodium chloride, sodium chloride, acetaminophen, ondansetron (ZOFRAN) IV, sodium chloride flush, sodium chloride flush    Patient Profile   Stacey Hartman is a 66 year old with a history of hypothyroidism, former tobacco abuse (quit 20 years ago), COPD, and last week had ECHO  with severely reduced EF.    Presenting with progressive chest pain and increased shortness of breath.   Assessment/Plan   1. Acute systolic CHF: Nonischemic cardiomyopathy. Etiology not certain. No CAD on LHC. Has LBBB of uncertain duration. cMRI ordered. Limited echo today. May need TEE to assess MR. - Echo done last week showed EF < 20% with diffuse hypokinesis and normal RV.   - R/LHC 02/06: No significant coronary disease, normal RA pressure and PCWP, LVEDP 28 mmHg, preserved CO - Received 40 mg lasix  IV on 02/06. Volume okay today.  - Starting coreg 3.125 mg BID - Continue spiro 12.5 mg daily - Start SGLT2i  2. Mitral valve regurgitation: - Mild to moderate on recent echo - may need TEE  2. Chest pain: No CAD on LHC. - HS troponin negative X 2 - Heparin gtt until rule out PE. CTA chest ordered  3. Hypothyroidism: - Check TSH  4. COPD/prior tobacco use:  Says she quit smoking 20 years ago.  -Will assess lung fields on imaging  Length of Stay: 1  FINCH, LINDSAY N, PA-C  10/06/2021, 8:11 AM  Advanced Heart Failure Team Pager [phone] (M-F; 7a - 5p)  Please contact [facility] for night-coverage after hours (5p -7a ) and weekends on [email or web address]   Patient seen with PA, agree with the above note.   Today, she is denying any chest pain or dyspnea.  Very different than yesterday.  Cath showed normal coronaries, RHC with normal filling pressures.   General: NAD Neck: No JVD, no thyromegaly or thyroid nodule.  Lungs: Clear to auscultation bilaterally with normal respiratory effort. CV: Nondisplaced PMI.  Heart regular S1/S2, no S3/S4, no murmur.  No peripheral edema.   Abdomen: Soft, nontender, no hepatosplenomegaly, no distention.  Skin: Intact without lesions or rashes.  Neurologic: Alert and oriented x 3.  Psych: Normal affect. Extremities: No clubbing or cyanosis.  HEENT: Normal.   I have difficulty explaining her presentation yesterday.  She has a severe, newly  diagnosed cardiomyopathy but right/left heart cath yesterday was relatively normal (no CAD, normal filling pressures).  Symptomatically, she seems significantly better today.  TnI was normal.   - CTA chest to assess lungs and assess for PE, can stop heparin gtt if no PE.  - Limited echo ordered for pericardial effusion, etc.  - Cardiac MRI to look for infiltrative disease, myocarditis.  - Start Coreg 3.125 mg bid and Farxiga 10 daily, continue spironolactone 12.5 daily.   - Entresto next if she has BP room.   Ambulate.   Loralie Champagne 10/06/2021 9:33 AM

## 2021-10-06 NOTE — TOC Initial Note (Addendum)
Transition of Care [facility]) - Initial/Assessment Note    Patient Details  Name: Stacey Hartman MRN: [id number] Date of Birth: May 19, 1956  [facility]) CM/SW Contact:    Erenest Rasher, RN Phone Number: [phone] 10/06/2021, 5:48 PM  Clinical Narrative:                 HF TOC CM spoke to pt at bedside. States she will need transportation home. Pt signed [facility] waiver, scanned to transportation. Pt reports having a scale. Plans to start following a heart healthy diet limiting sodium. Will request meds come up from [facility] prior to dc.   Benefits completed for meds, her meds are $0 copay.   Expected Discharge Plan: Home/Self Care Barriers to Discharge: Continued Medical Work up   Patient Goals and CMS Choice   Expected Discharge Plan and Services Expected Discharge Plan: Home/Self Care   Discharge Planning Services: CM Consult   Living arrangements for the past 2 months: Single Family Home                                      Prior Living Arrangements/Services Living arrangements for the past 2 months: Single Family Home Lives with:: Significant Other Patient language and need for interpreter reviewed:: Yes Do you feel safe going back to the place where you live?: Yes      Need for Family Participation in Patient Care: No (Comment) Care giver support system in place?: No (comment)   Criminal Activity/Legal Involvement Pertinent to Current Situation/Hospitalization: No - Comment as needed  Activities of Daily Living      Permission Sought/Granted Permission sought to share information with : Case Manager, Family Supports, PCP Permission granted to share information with : Yes, Verbal Permission Granted  Share Information with NAME: Stacey Hartman     Permission granted to share info w Relationship: boyfriend  Permission granted to share info w Contact Information: [phone]  Emotional Assessment Appearance:: Appears stated  age Attitude/Demeanor/Rapport: Engaged Affect (typically observed): Accepting Orientation: : Oriented to Self, Oriented to Place, Oriented to  Time, Oriented to Situation   Psych Involvement: No (comment)  Admission diagnosis:  Acute systolic heart failure (HCC) [I50.21] Dyspnea, unspecified type [R06.00] Patient Active Problem List   Diagnosis Date Noted   Depressed left ventricular ejection fraction 10/05/2021   Pulmonary hypertension, unspecified ([place]) 10/05/2021   Nonrheumatic mitral valve regurgitation 10/05/2021   LBBB (left bundle branch block) 79/15/0569   Acute systolic heart failure ([place]) 10/05/2021   DOE (dyspnea on exertion) 08/25/2021   PCP:  Stacey Schwalbe, MD Pharmacy:   [place], [place] - [address] [place] [address] Phone: [phone] Fax: [phone]     Social Determinants of Health (SDOH) Interventions    Readmission Risk Interventions No flowsheet data found.

## 2021-10-07 ENCOUNTER — Inpatient Hospital Stay (HOSPITAL_COMMUNITY): Payer: 59

## 2021-10-07 DIAGNOSIS — I6340 Cerebral infarction due to embolism of unspecified cerebral artery: Secondary | ICD-10-CM | POA: Insufficient documentation

## 2021-10-07 DIAGNOSIS — I5021 Acute systolic (congestive) heart failure: Secondary | ICD-10-CM

## 2021-10-07 LAB — CBC
HCT: 39.6 % (ref 36.0–46.0)
Hemoglobin: 13.5 g/dL (ref 12.0–15.0)
MCH: 31.5 pg (ref 26.0–34.0)
MCHC: 34.1 g/dL (ref 30.0–36.0)
MCV: 92.3 fL (ref 80.0–100.0)
Platelets: 166 10*3/uL (ref 150–400)
RBC: 4.29 MIL/uL (ref 3.87–5.11)
RDW: 14.1 % (ref 11.5–15.5)
WBC: 7.1 10*3/uL (ref 4.0–10.5)
nRBC: 0 % (ref 0.0–0.2)

## 2021-10-07 LAB — BASIC METABOLIC PANEL
Anion gap: 9 (ref 5–15)
BUN: 16 mg/dL (ref 8–23)
CO2: 23 mmol/L (ref 22–32)
Calcium: 9.1 mg/dL (ref 8.9–10.3)
Chloride: 105 mmol/L (ref 98–111)
Creatinine, Ser: 0.78 mg/dL (ref 0.44–1.00)
GFR, Estimated: >60 mL/min (ref >60)
Glucose, Bld: 97 mg/dL (ref 70–99)
Potassium: 4.4 mmol/L (ref 3.5–5.1)
Sodium: 137 mmol/L (ref 135–145)

## 2021-10-07 LAB — GLUCOSE, CAPILLARY: Glucose-Capillary: 96 mg/dL (ref 70–99)

## 2021-10-07 LAB — T4, FREE: Free T4: 0.78 ng/dL (ref 0.61–1.12)

## 2021-10-07 IMAGING — MR MR HEAD W/O CM
7 of 12 series · 27 of 48 positions shown · non-contrast
Comparison: CT head from the same day.

CLINICAL DATA: Neuro deficit, acute, stroke suspected code stroke

EXAM:
MRI HEAD WITHOUT CONTRAST
TECHNIQUE: Multiplanar, multiecho pulse sequences of the brain and surrounding
structures were obtained without intravenous contrast.

[Series 2: DWI · axial · 3.0mm · 0.94mm/px · z∈[-23,+118]mm · 8 of 99 slices shown (1 of 2)]
[im 1/99]
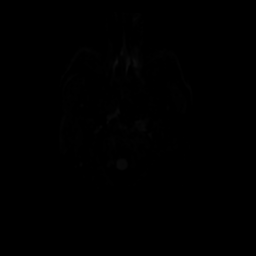
[im 11/99]
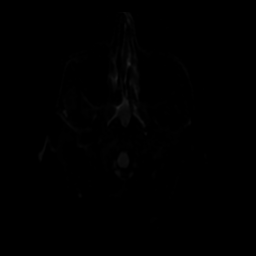
[im 33/99]
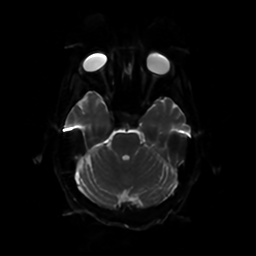
[im 44/99]
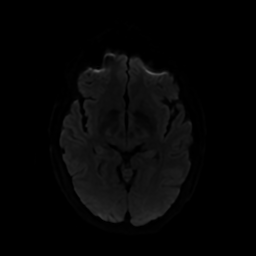
[im 55/99]
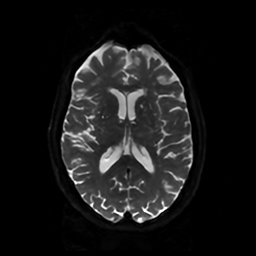
[im 66/99]
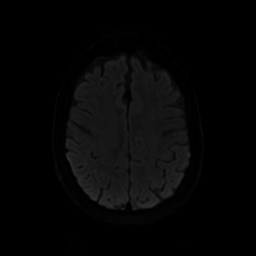
[im 88/99]
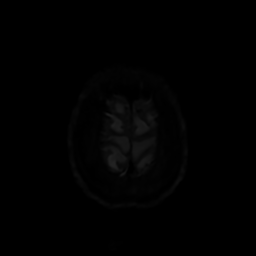
[im 99/99]
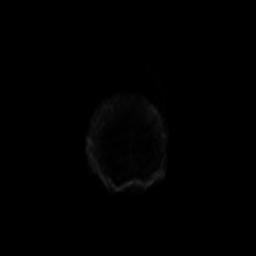

[Series 3: DWI · coronal · 4.0mm · 0.94mm/px · 6 of 72 slices shown (2 of 2)]
[im 1/72]
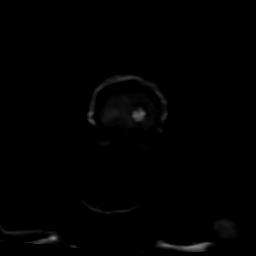
[im 15/72]
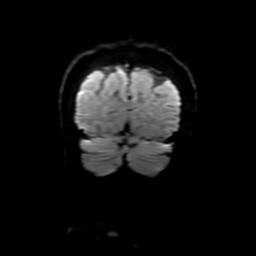
[im 29/72]
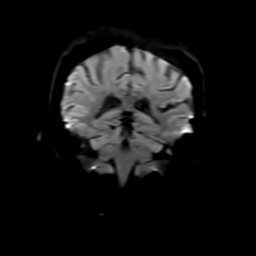
[im 43/72]
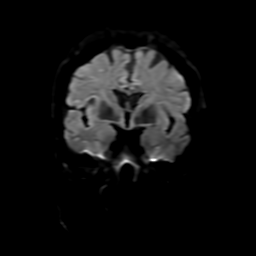
[im 57/72]
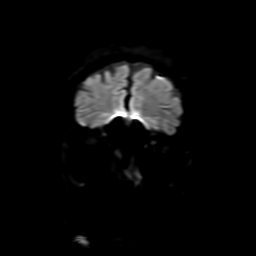
[im 72/72]
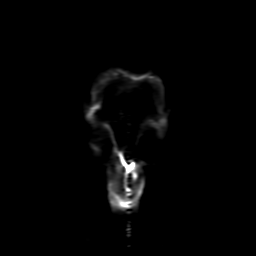

[Series 4: FLAIR · axial · 4.0mm · 0.45mm/px · z∈[-22,+112]mm · 3 of 33 slices shown (1 of 2)]
[im 1/33]
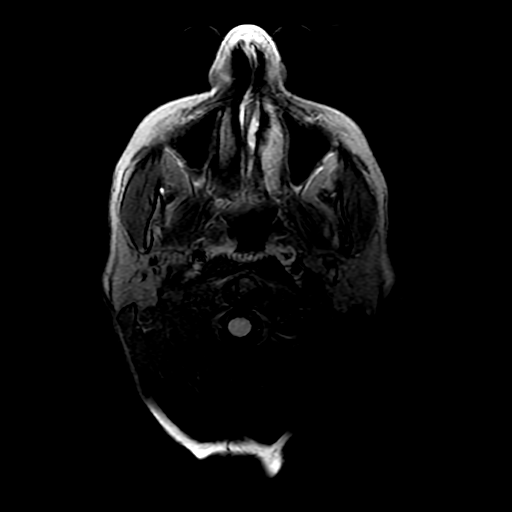
[im 17/33]
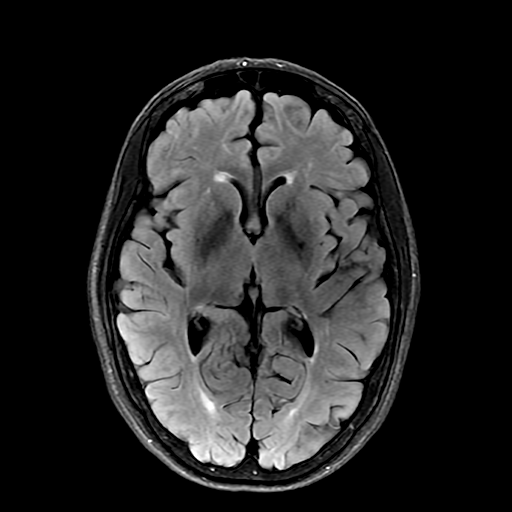
[im 33/33]
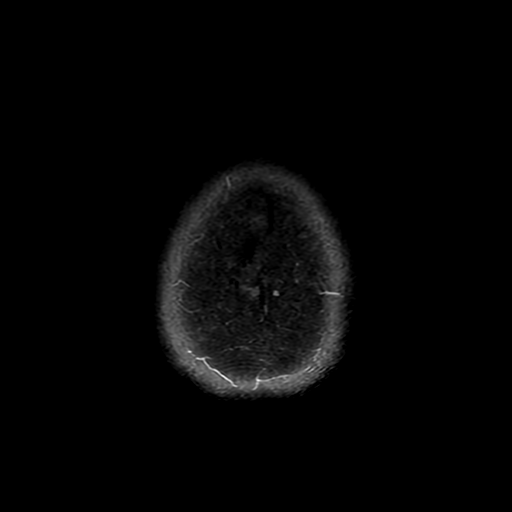

[Series 8: FLAIR · sagittal · 5.0mm · 0.23mm/px · 2 of 23 slices shown (2 of 2)]
[im 1/23]
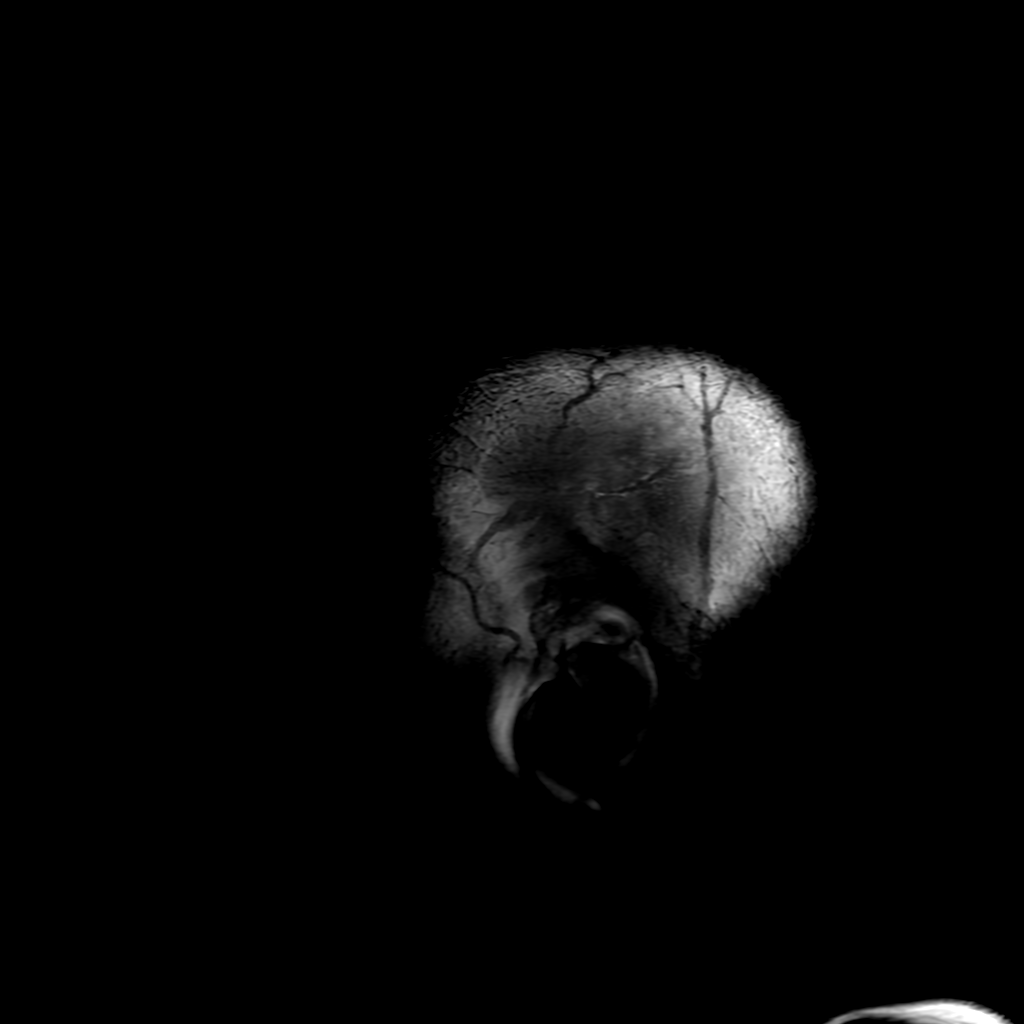
[im 23/23]
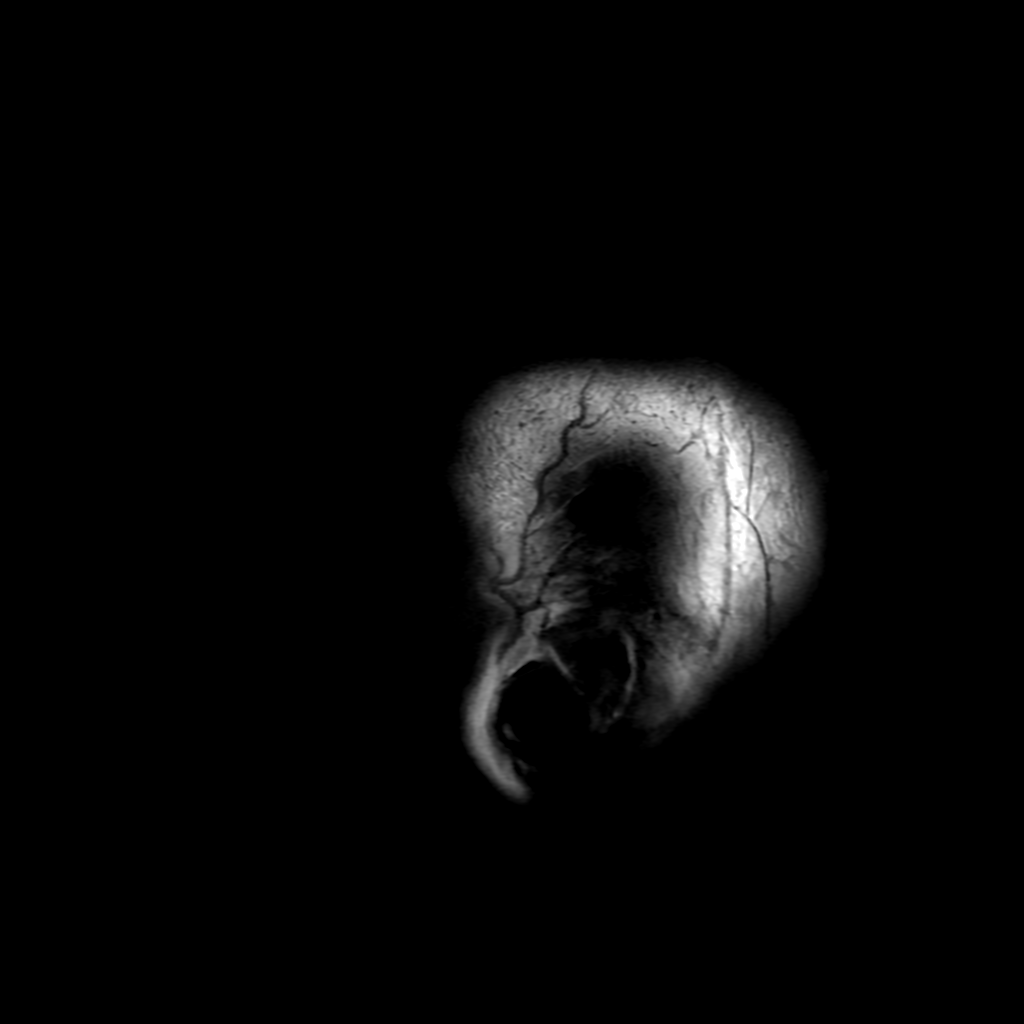

[Series 250: ADC · axial · 3.0mm · 0.94mm/px · z∈[-23,+118]mm · 4 of 50 slices shown (1 of 3)]
[im 1/50]
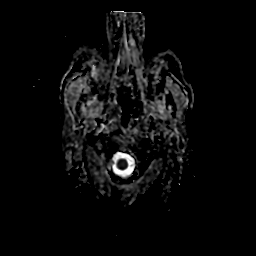
[im 17/50]
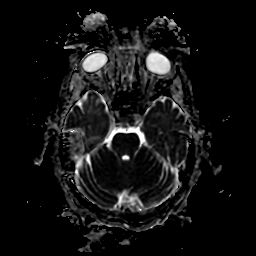
[im 33/50]
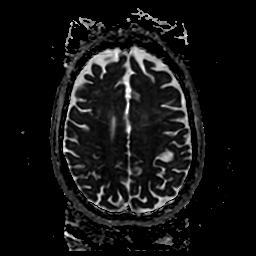
[im 50/50]
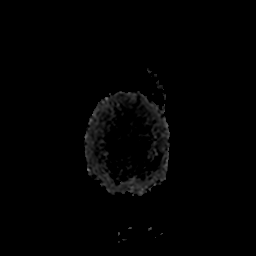

[Series 350: ADC · coronal · 4.0mm · 0.94mm/px · 3 of 36 slices shown (2 of 3)]
[im 1/36]
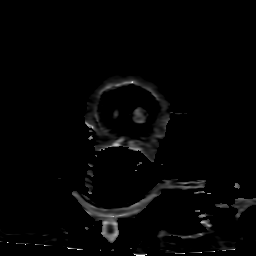
[im 18/36]
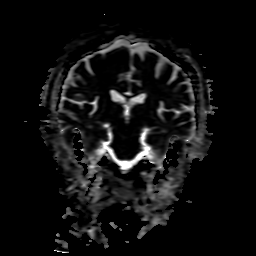
[im 36/36]
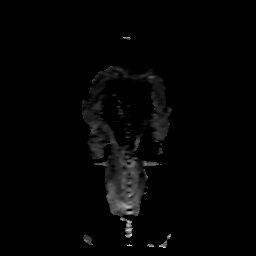

[Series 551: ADC · coronal · 3.0mm · 0.70mm/px · 1 of 14 slices shown (3 of 3)]
[im 1/14]
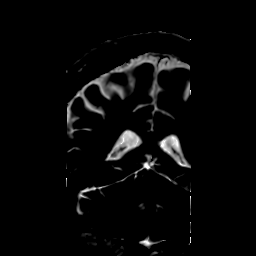

[27 of 48 positions shown; findings below may reference images not displayed]

FINDINGS: Brain: Punctate acute infarct in the high right frontal cortex
(series 2 and 250, image 39). No edema or mass effect. No acute
hemorrhage, hydrocephalus, mass lesion, midline shift, or
extra-axial fluid collection. Mild to moderate scattered T2/FLAIR
hyperintensities in the white matter, nonspecific but compatible
with chronic microvascular ischemic disease.

Vascular: Poor visualization of the right M1 MCA flow void.

Skull and upper cervical spine: Normal marrow signal. Craniocervical
degenerative change.

Sinuses/Orbits: Clear sinuses.  Unremarkable orbits.

Other: Trace mastoid effusions.

Preliminary findings discussed with Dr RANTONA via telephone at [DATE]
a.m.
IMPRESSION: 1. Punctate acute infarct in the high right frontal cortex. No edema
or mass effect.
2. Poor visualization of the right M1 MCA flow void. This could be
artifactual, but CTA or MRA is recommended to exclude significant
stenosis or occlusion.
3. Mild-to-moderate microvascular ischemic disease.

## 2021-10-07 IMAGING — CT CT ANGIO HEAD-NECK (W OR W/O PERF)
2 of 7 series · 8 of 33 positions shown · IV contrast (OMNI 350)
Comparison: Correlation made with recent imaging

CLINICAL DATA: Acute stroke on MRI, possible abnormal MCA

EXAM:
CT ANGIOGRAPHY HEAD AND NECK
TECHNIQUE: Multidetector CT imaging of the head and neck was performed using
the standard protocol during bolus administration of intravenous
contrast. Multiplanar CT image reconstructions and MIPs were
obtained to evaluate the vascular anatomy. Carotid stenosis
measurements (when applicable) are obtained utilizing NASCET
criteria, using the distal internal carotid diameter as the
denominator.

[Series 5: cta neck · axial · 0.46mm/px · z∈[-228,-116]mm · 2 of 170 slices shown]
[im 57/170  soft-tissue]
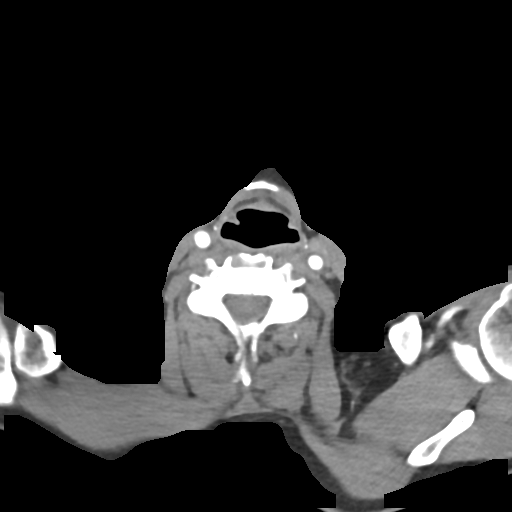
[im 113/170  soft-tissue]
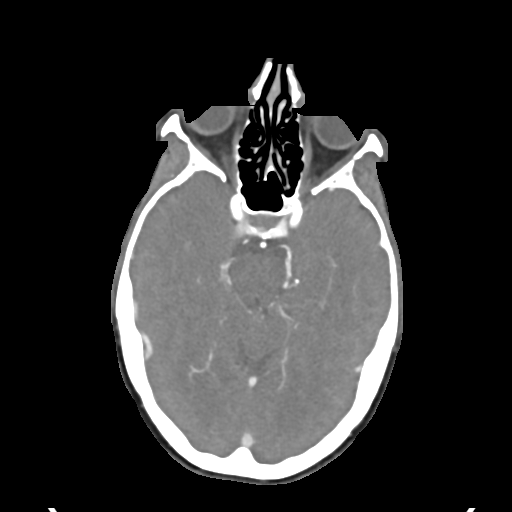

[Series 7: cta neck axial · axial · 0.44mm/px · z∈[-293,-54]mm · 6 of 335 slices shown]
[im 48/335  soft-tissue]
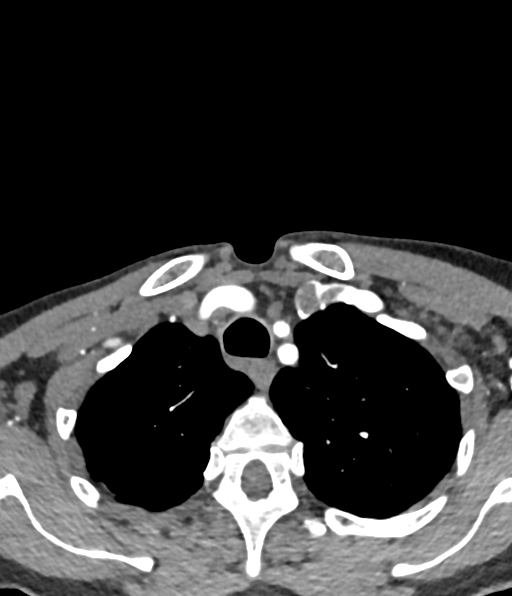
[im 96/335  bone]
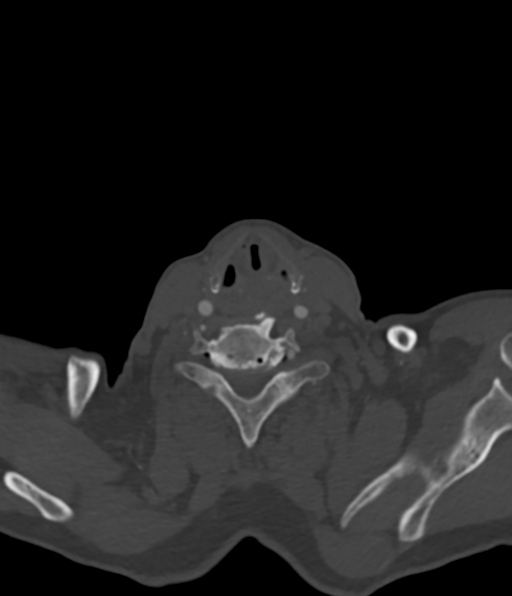
[im 144/335  soft-tissue]
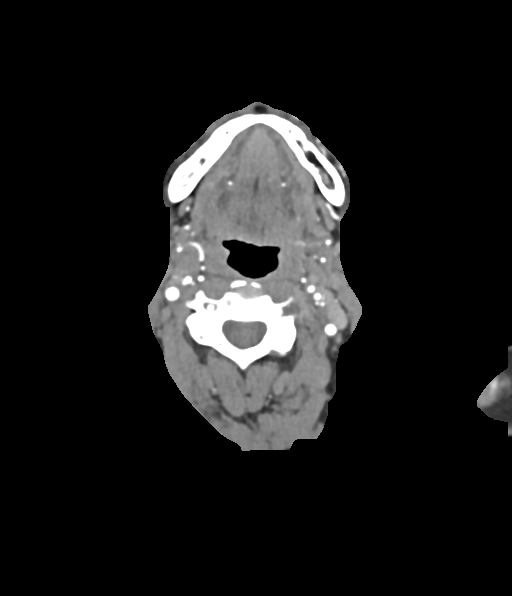
[im 191/335  bone]
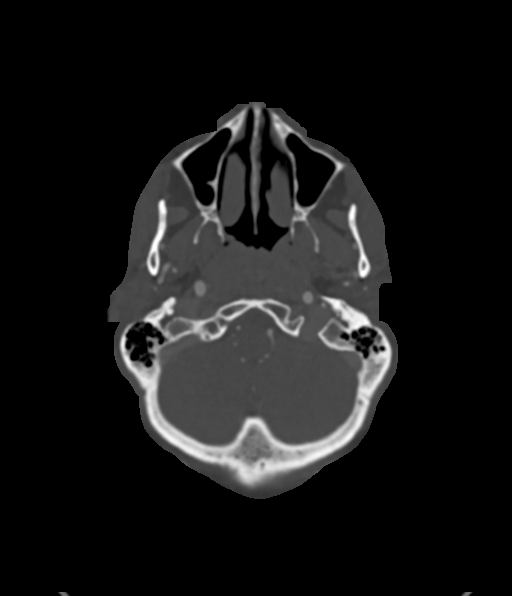
[im 239/335  soft-tissue]
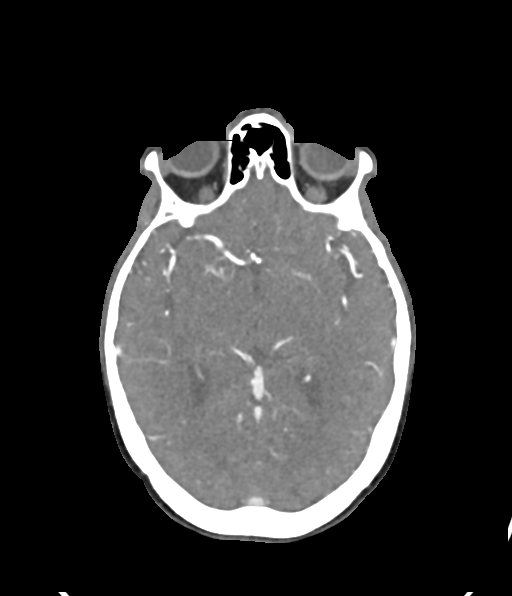
[im 287/335  bone]
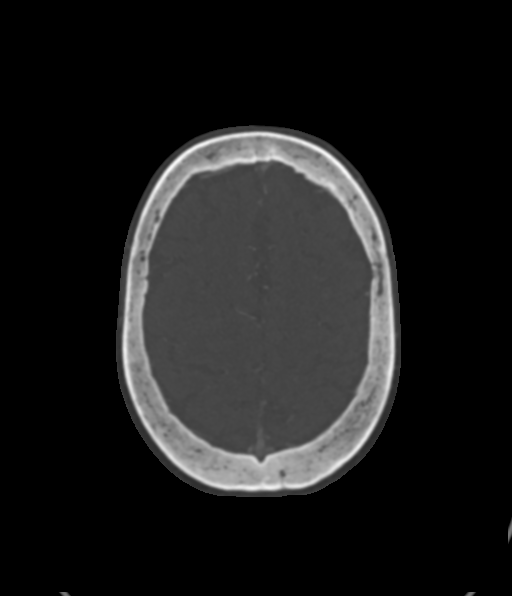

[8 of 33 positions shown; findings below may reference images not displayed]

RADIATION DOSE REDUCTION: This exam was performed according to the
departmental dose-optimization program which includes automated
exposure control, adjustment of the mA and/or kV according to
patient size and/or use of iterative reconstruction technique.

CONTRAST:  75mL OMNIPAQUE IOHEXOL 350 MG/ML SOLN
FINDINGS: CTA NECK

Aortic arch: Mild calcified plaque along the arch. Patent great
vessel origins.

Right carotid system: Patent.  No stenosis.

Left carotid system: Patent. Minimal calcified plaque along the
distal common carotid. No stenosis.

Vertebral arteries: Patent. Left vertebral is dominant. No stenosis.

Skeleton: Cervical spine degenerative changes.

Other neck: Unremarkable.

Upper chest: Emphysema.

Review of the MIP images confirms the above findings

CTA HEAD

Anterior circulation: Intracranial internal carotid arteries are
patent with minimal calcified plaque. Anterior and middle cerebral
arteries are patent.

Posterior circulation: Intracranial vertebral arteries are patent.
Basilar artery is patent. Major cerebral artery origins are patent.
Right larger than left posterior communicating arteries are present.
Posterior cerebral arteries are patent.

Venous sinuses: Patent as allowed by contrast bolus timing.

Review of the MIP images confirms the above findings
IMPRESSION: No large vessel occlusion, hemodynamically significant stenosis, or
evidence of dissection.

The right M1 MCA is normal.

## 2021-10-07 IMAGING — MR MR CARD MORPHOLOGY WO/W CM
42 of 48 series · 42 of 48 positions shown · IV contrast (Contrast agent)
Comparison: none

CLINICAL DATA: Cardiomyopathy of uncertain etiology

EXAM:
CARDIAC MRI
TECHNIQUE: The patient was scanned on a 1.5 Tesla GE magnet. A dedicated
cardiac coil was used. Functional imaging was done using Fiesta
sequences. [DATE], and 4 chamber views were done to assess for RWMA's.
Modified AUAD rule using a short axis stack was used to
calculate an ejection fraction on a dedicated work station using
Circle software. The patient received 8 cc of Gadavist. After 10
minutes inversion recovery sequences were used to assess for
infiltration and scar tissue.

[Series 4: t2_[id number]_tra_bh · axial · 8.0mm · 1.41mm/px · 1 of 16 slices shown]
[im 1/16]
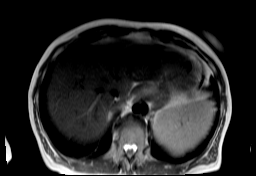

[Series 8: bSSFP · oblique · 8.0mm · 1.61mm/px · 1 of 25 slices shown (1 of 22)]
[im 1/25]
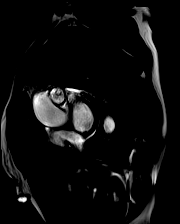

[Series 9: bSSFP · oblique · 8.0mm · 1.61mm/px · 1 of 25 slices shown (2 of 22)]
[im 1/25]
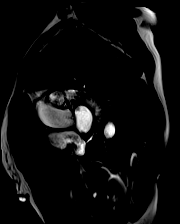

[Series 10: bSSFP · oblique · 8.0mm · 1.61mm/px · 1 of 25 slices shown (3 of 22)]
[im 1/25]
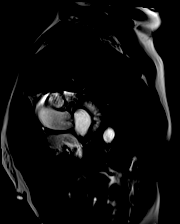

[Series 11: bSSFP · oblique · 8.0mm · 1.61mm/px · 1 of 25 slices shown (4 of 22)]
[im 1/25]
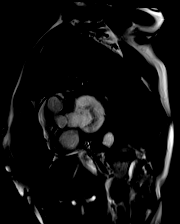

[Series 12: bSSFP · oblique · 8.0mm · 1.61mm/px · 1 of 25 slices shown (5 of 22)]
[im 1/25]
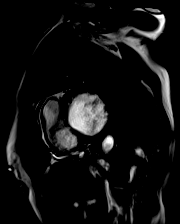

[Series 13: bSSFP · oblique · 8.0mm · 1.61mm/px · 1 of 25 slices shown (6 of 22)]
[im 1/25]
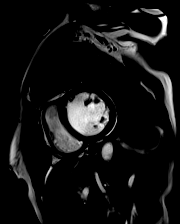

[Series 14: bSSFP · oblique · 8.0mm · 1.61mm/px · 1 of 25 slices shown (7 of 22)]
[im 1/25]
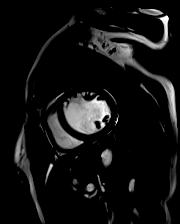

[Series 15: bSSFP · oblique · 8.0mm · 1.61mm/px · 1 of 25 slices shown (8 of 22)]
[im 1/25]
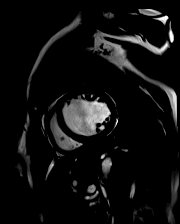

[Series 16: bSSFP · oblique · 8.0mm · 1.61mm/px · 1 of 25 slices shown (9 of 22)]
[im 1/25]
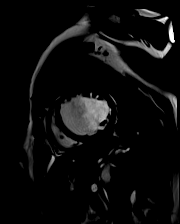

[Series 17: bSSFP · oblique · 8.0mm · 1.61mm/px · 1 of 25 slices shown (10 of 22)]
[im 1/25]
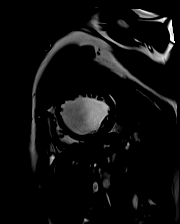

[Series 18: bSSFP · oblique · 8.0mm · 1.61mm/px · 1 of 25 slices shown (11 of 22)]
[im 1/25]
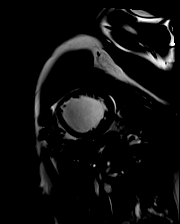

[Series 19: bSSFP · oblique · 8.0mm · 1.61mm/px · 1 of 25 slices shown (12 of 22)]
[im 1/25]
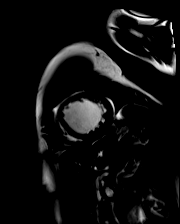

[Series 20: bSSFP · oblique · 8.0mm · 1.61mm/px · 1 of 25 slices shown (13 of 22)]
[im 1/25]
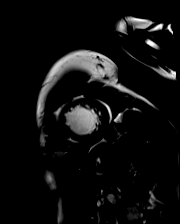

[Series 21: bSSFP · oblique · 8.0mm · 1.61mm/px · 1 of 25 slices shown (14 of 22)]
[im 1/25]
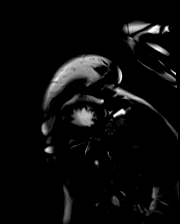

[Series 22: bSSFP · oblique · 8.0mm · 1.61mm/px · 1 of 25 slices shown (15 of 22)]
[im 1/25]
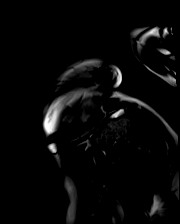

[Series 23: bSSFP · oblique · 8.0mm · 1.61mm/px · 1 of 25 slices shown (16 of 22)]
[im 1/25]
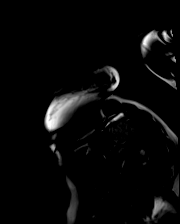

[Series 24: bSSFP · oblique · 8.0mm · 1.61mm/px · 1 of 25 slices shown (17 of 22)]
[im 1/25]
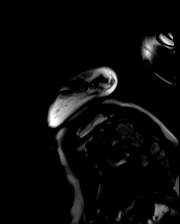

[Series 25: bSSFP · oblique · 8.0mm · 1.61mm/px · 1 of 25 slices shown (18 of 22)]
[im 1/25]
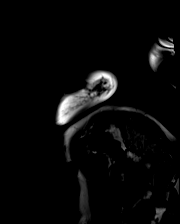

[Series 26: bSSFP · oblique · 6.0mm · 1.41mm/px · 1 of 25 slices shown (19 of 22)]
[im 1/25]
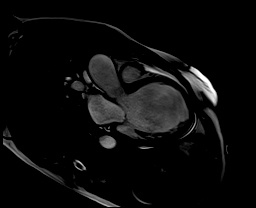

[Series 27: bSSFP · oblique · 6.0mm · 1.41mm/px · 1 of 25 slices shown (20 of 22)]
[im 1/25]
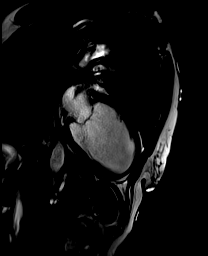

[Series 28: bSSFP · oblique · 6.0mm · 1.41mm/px · 1 of 25 slices shown (21 of 22)]
[im 1/25]
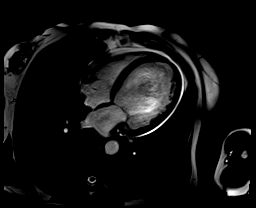

[Series 29: (id)_long_t1 · oblique · 8.0mm · 1.56mm/px · 1 of 24 slices shown (1 of 2)]
[im 1/24]
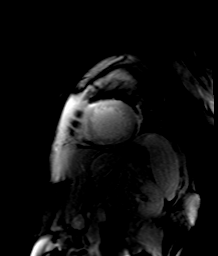

[Series 30: (id)_long_t1_moco · oblique · 8.0mm · 1.56mm/px · 1 of 24 slices shown (1 of 2)]
[im 1/24]
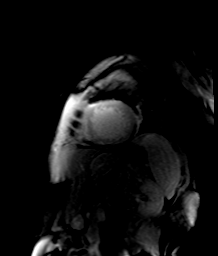

[Series 31: (id)_long_t1_moco_t1 · oblique · 8.0mm · 1.56mm/px · 1 of 6 slices shown (1 of 2)]
[im 1/6]
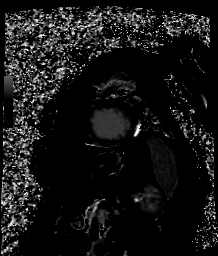

[Series 33: (id)_trufi · oblique · 8.0mm · 2.08mm/px · 1 of 9 slices shown]
[im 1/9]
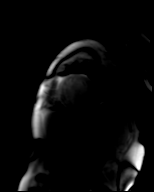

[Series 34: (id)_trufi_moco · oblique · 8.0mm · 2.08mm/px · 1 of 9 slices shown]
[im 1/9]
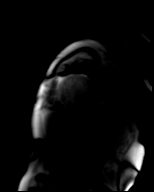

[Series 35: (id)_trufi_moco_t2 · oblique · 8.0mm · 2.08mm/px · 1 of 4 slices shown]
[im 1/4]
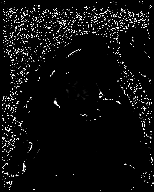

[Series 37: (id)_long_t1 · oblique · 8.0mm · 1.56mm/px · 1 of 24 slices shown (2 of 2)]
[im 1/24]
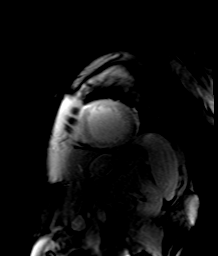

[Series 38: (id)_long_t1_moco · oblique · 8.0mm · 1.56mm/px · 1 of 24 slices shown (2 of 2)]
[im 1/24]
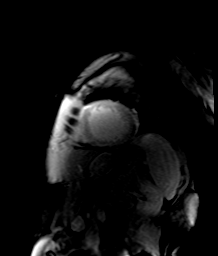

[Series 39: (id)_long_t1_moco_t1 · oblique · 8.0mm · 1.56mm/px · 1 of 6 slices shown (2 of 2)]
[im 1/6]
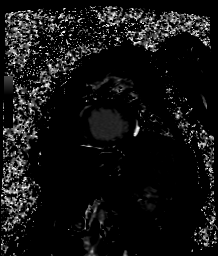

[Series 41: bSSFP · coronal · 6.0mm · 1.41mm/px · 1 of 25 slices shown (22 of 22)]
[im 1/25]
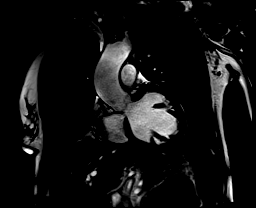

[Series 42: aortic valve cine · oblique · 6.0mm · 1.41mm/px · 1 of 25 slices shown]
[im 1/25]
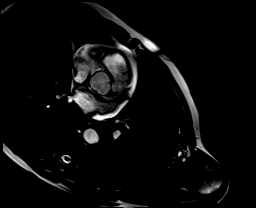

[Series 50: STIR · axial · 8.0mm · 1.92mm/px · 1 of 1 slices shown (1 of 2)]
[im 1/1]
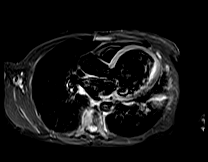

[Series 51: STIR · axial · 8.0mm · 1.92mm/px · 1 of 1 slices shown (2 of 2)]
[im 1/1]
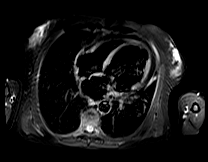

[Series 52: pre short axis · oblique · non-contrast · 8.0mm · 2.25mm/px · 1 of 10 slices shown (1 of 6)]
[im 1/10]
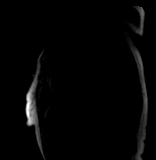

[Series 53: pre short axis · oblique · non-contrast · 8.0mm · 2.25mm/px · 1 of 10 slices shown (2 of 6)]
[im 1/10]
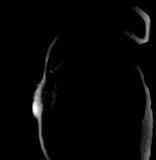

[Series 54: pre short axis · oblique · non-contrast · 8.0mm · 2.25mm/px · 1 of 10 slices shown (3 of 6)]
[im 1/10]
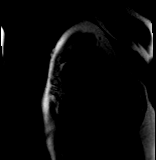

[Series 55: pre short axis · oblique · non-contrast · 8.0mm · 2.25mm/px · 1 of 10 slices shown (4 of 6)]
[im 1/10]
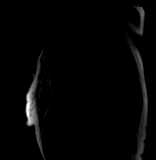

[Series 56: pre short axis · oblique · non-contrast · 8.0mm · 2.25mm/px · 1 of 10 slices shown (5 of 6)]
[im 1/10]
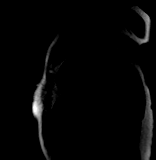

[Series 57: pre short axis · oblique · non-contrast · 8.0mm · 2.25mm/px · 1 of 10 slices shown (6 of 6)]
[im 1/10]
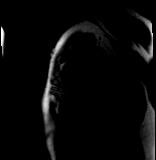

[Series 61: rest short axis · oblique · 8.0mm · 2.25mm/px · 1 of 60 slices shown]
[im 1/60]
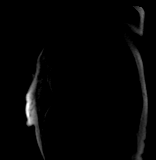

[42 of 48 positions shown; findings below may reference images not displayed]

FINDINGS: Limited images of the lung fields showed no gross abnormalities.

Severely dilated left ventricle with normal wall thickness. Global
hypokinesis with EF 12%, no LV thrombus noted. Normal right
ventricular size, mildly decreased systolic function (RV EF 40%).
Normal left and right atrial sizes. Mild mitral regurgitation. Mild
tricuspid regurgitation. Trileaflet aortic valve, no stenosis or
regurgitation.

On delayed enhancement imaging, there was no myocardial late
gadolinium enhancement (LGE).

MEASUREMENTS:
MEASUREMENTS
LVEDV 330 mL
LVSV 39 mL
LVEF 12%

RVEDV 76
RVSV 31
RVEF 40%

T1 [BN], ECV 42% in the basal septum
IMPRESSION: 1.  Severely dilated LV with global severe hypokinesis, EF 12%.

2.  Normal RV size with mildly decreased systolic function, EF 40%.

3.  No myocardial LGE.

4. Elevated T1 indices, ECV was 42% in the basal septum. Consider
cardiac amyloidosis though cardiac morphology does not support this.

AUAD

## 2021-10-07 MED ORDER — STROKE: EARLY STAGES OF RECOVERY BOOK
Freq: Once | Status: AC
  Filled 2021-10-07: qty 1

## 2021-10-07 MED ORDER — SODIUM CHLORIDE 0.9 % IV SOLN: INTRAVENOUS | Status: DC

## 2021-10-07 MED ORDER — ASPIRIN 81 MG PO CHEW
81.0000 mg | CHEWABLE_TABLET | Freq: Every day | ORAL | Status: DC
  Administered 2021-10-07 – 2021-10-09 (×3): 81 mg via ORAL
  Filled 2021-10-07 (×3): qty 1

## 2021-10-07 MED ORDER — DIAZEPAM 5 MG/ML IJ SOLN: 5.0000 mg | Freq: Once | INTRAMUSCULAR | Status: DC

## 2021-10-07 MED ORDER — DIAZEPAM 5 MG PO TABS: 5.0000 mg | ORAL_TABLET | Freq: Once | ORAL | Status: DC

## 2021-10-07 MED ORDER — LORAZEPAM 2 MG/ML IJ SOLN
INTRAMUSCULAR | Status: AC
  Filled 2021-10-07: qty 1

## 2021-10-07 MED ORDER — ATORVASTATIN CALCIUM 40 MG PO TABS
40.0000 mg | ORAL_TABLET | Freq: Every day | ORAL | Status: DC
  Administered 2021-10-07 – 2021-10-09 (×3): 40 mg via ORAL
  Filled 2021-10-07 (×3): qty 1

## 2021-10-07 MED ORDER — IOHEXOL 350 MG/ML SOLN
100.0000 mL | Freq: Once | INTRAVENOUS | Status: AC | PRN
  Administered 2021-10-07: 75 mL via INTRAVENOUS

## 2021-10-07 MED ORDER — GADOBUTROL 1 MMOL/ML IV SOLN
10.0000 mL | Freq: Once | INTRAVENOUS | Status: AC | PRN
  Administered 2021-10-07: 10 mL via INTRAVENOUS

## 2021-10-07 MED ORDER — LORAZEPAM 2 MG/ML IJ SOLN
0.5000 mg | Freq: Once | INTRAMUSCULAR | Status: AC
  Administered 2021-10-07: 0.5 mg via INTRAVENOUS
  Filled 2021-10-07: qty 1

## 2021-10-07 NOTE — Significant Event (Signed)
Rapid Response Event Note   Reason for Call :  Patient in MRI for cardiac MRI. She was her normal self when she entered the scanner at 0640.  At 0716 she notified the staff.  When they stopped the scan she was complaining of a HA and numbness of her tongue, mouth and left face.  Staff noted Left facial droop.  Initial Focused Assessment:  Upon my assessment she is lying flat on the MRI table.  NIHSS 2 decreased sensory left face/arm/leg and mild left facial droop.  Code Stroke called at [phone] Dr Reeves Forth at bedside to assess patient. BP 102/73 HR 84 O2 sat 98% on RA    Interventions:  CT head MRI for treatment decision CTA head and neck Will remain in TNK window until 1110am  Plan of Care:  mNIHSS and VS q 30 min until 1110am. See stroke response and neurology notes for additional plan of care.   Event Summary:   MD Notified: RN notifing Dr Aundra Dubin Call Time: [phone] Arrival Time: 0732 End Time: 0915  Raliegh Ip, RN

## 2021-10-07 NOTE — Evaluation (Signed)
Physical Therapy Evaluation Patient Details Name: Stacey Hartman MRN: [id number] DOB: 1956/07/06 Today's Date: 10/07/2021  History of Present Illness  Patient is a 66 yo female admitted to the ED on 10/05/21 due to chest pain and shortness of breath. ECHO on 10/01/21 shows EF <20%. Code stroke called on 10/07/21 as pt devloped acute L-sided facila numbness while recieving MRI. Neuro consulted. 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 No edema or mass effect. Udergoing continued imaging and work up. PMH includes: CAD, LBBB, CHF, COPD, former tobacco use, COPD, and hypothyroidism.   Clinical Impression  Pt in bed upon arrival of PT, agreeable to evaluation at this time. Prior to admission the pt was completely independent, mobilizing without need for DME. The pt now presents with limitations in functional mobility, dynamic stability and endurance due to above dx, and will continue to benefit from skilled PT to address these deficits. She did not present with any focal weakness, and denied difference in sensation in LE. However, pt with intermittent L/R drifting with gait and slowed pace. She scored 17/24 on DGI, and will benefit from continued skilled PT to address balance deficits to allow pt to return to full independence.   Dynamic Gait Index (DGI): 17/24 (<19 indicates increased risk for falls)  Gait Speed: 0.63m/s. (Gait speed < 1.81m/s indicates increased risk of falls     Recommendations for follow up therapy are one component of a multi-disciplinary discharge planning process, led by the attending physician.  Recommendations may be updated based on patient status, additional functional criteria and insurance authorization.  Follow Up Recommendations Outpatient PT (OP neuro) - but reports if not available in [place] she would prefer HH.     Assistance Recommended at Discharge Intermittent Supervision/Assistance  Patient can return home with the following  Assistance  with cooking/housework;Assist for transportation;Help with stairs or ramp for entrance    Equipment Recommendations Cane  Recommendations for Other Services       Functional Status Assessment Patient has had a recent decline in their functional status and demonstrates the ability to make significant improvements in function in a reasonable and predictable amount of time.     Precautions / Restrictions Precautions Precautions: None Restrictions Weight Bearing Restrictions: No      Mobility  Bed Mobility Overal bed mobility: Modified Independent             General bed mobility comments: extra time, use of bed rails for stability, HOB elevated    Transfers Overall transfer level: Needs assistance Equipment used: None Transfers: Sit to/from Stand Sit to Stand: Min guard           General transfer comment: minG for safety, pt standing from EOB while PT out of room without LOB or DME    Ambulation/Gait Ambulation/Gait assistance: Supervision, Min guard Gait Distance (Feet): 300 Feet Assistive device: None Gait Pattern/deviations: Step-through pattern, Drifts right/left Gait velocity: 0.63 m/s Gait velocity interpretation: 1.31 - 2.62 ft/sec, indicative of limited community ambulator   General Gait Details: intermittent drifting to R and L initially but improved with continued mobility. pt reports this is slower than her normal gait  Stairs Stairs: Yes Stairs assistance: Min guard Stair Management: One rail Right, Alternating pattern, Forwards Number of Stairs: 4 General stair comments: alternating, single rail  Wheelchair Mobility    Modified Rankin (Stroke Patients Only) Modified Rankin (Stroke Patients Only) Pre-Morbid Rankin Score: Slight disability Modified Rankin: Moderately severe disability     Balance Overall  balance assessment: Mild deficits observed, not formally tested                               Standardized Balance  Assessment Standardized Balance Assessment : Dynamic Gait Index   Dynamic Gait Index Level Surface: Normal Change in Gait Speed: Mild Impairment Gait with Horizontal Head Turns: Mild Impairment Gait with Vertical Head Turns: Mild Impairment Gait and Pivot Turn: Mild Impairment Step Over Obstacle: Mild Impairment Step Around Obstacles: Mild Impairment Steps: Mild Impairment Total Score: 17       Pertinent Vitals/Pain Pain Assessment Pain Assessment: No/denies pain    Home Living Family/patient expects to be discharged to:: Private residence (Mobile home) Living Arrangements: Spouse/significant other (Boyfriend) Available Help at Discharge: Family Type of Home: Mobile home Home Access: Stairs to enter Entrance Stairs-Rails: Can reach both Entrance Stairs-Number of Steps: 4   Home Layout: One level Home Equipment: None      Prior Function Prior Level of Function : Independent/Modified Independent (Patient does not drive, boyfriend drives her where she needs to go)             Mobility Comments: Independent ADLs Comments: Independent     Hand Dominance   Dominant Hand: Right    Extremity/Trunk Assessment   Upper Extremity Assessment Upper Extremity Assessment: Overall WFL for tasks assessed LUE Deficits / Details: Code stroke called 2/8 AM, patient stating tingling/numbness on entire L side of body, has sensation, but has "less feeling" in comparison to R side LUE Sensation: decreased light touch LUE Coordination: WNL    Lower Extremity Assessment Lower Extremity Assessment: Overall WFL for tasks assessed (pt states no difference in sensation in LE, only her face)    Cervical / Trunk Assessment Cervical / Trunk Assessment: Kyphotic  Communication   Communication: No difficulties  Cognition Arousal/Alertness: Awake/alert Behavior During Therapy: WFL for tasks assessed/performed Overall Cognitive Status: Within Functional Limits for tasks assessed                                           General Comments General comments (skin integrity, edema, etc.): VSS on RA        Assessment/Plan    PT Assessment Patient needs continued PT services  PT Problem List Decreased strength;Decreased range of motion;Decreased activity tolerance;Decreased balance;Decreased mobility;Decreased coordination       PT Treatment Interventions DME instruction;Gait training;Stair training;Functional mobility training;Therapeutic activities;Therapeutic exercise;Balance training;Neuromuscular re-education;Patient/family education    PT Goals (Current goals can be found in the Care Plan section)  Acute Rehab PT Goals Patient Stated Goal: return to full independence PT Goal Formulation: With patient Time For Goal Achievement: 10/21/21 Potential to Achieve Goals: Good    Frequency Min 3X/week        AM-PAC PT "6 Clicks" Mobility  Outcome Measure Help needed turning from your back to your side while in a flat bed without using bedrails?: None Help needed moving from lying on your back to sitting on the side of a flat bed without using bedrails?: None Help needed moving to and from a bed to a chair (including a wheelchair)?: None Help needed standing up from a chair using your arms (e.g., wheelchair or bedside chair)?: None Help needed to walk in hospital room?: A Little Help needed climbing 3-5 steps with a railing? : A Little 6  Click Score: 22    End of Session Equipment Utilized During Treatment: Gait belt Activity Tolerance: Patient tolerated treatment well Patient left: in chair;with call bell/phone within reach Nurse Communication: Mobility status PT Visit Diagnosis: Unsteadiness on feet (R26.81);Other abnormalities of gait and mobility (R26.89)    Time: 1409-1430 PT Time Calculation (min) (ACUTE ONLY): 21 min   Charges:   PT Evaluation $PT Eval Moderate Complexity: 1 Mod          [place] Carbo, PT, DPT   Acute  Rehabilitation Department Pager #: [phone]  Sandra Cockayne 10/07/2021, 2:59 PM

## 2021-10-07 NOTE — Code Documentation (Incomplete)
Stroke Response Nurse Documentation Code Documentation  Stacey Hartman is a 66 y.o. female admitted on 10/05/21 for chest pain and dyspnea.   Patient had complaints of left-sided numbness and facial droop while in cardiac MRI. Cardiac MRI discontinued and patient transferred to CT with stroke team.   NIHSS 2, see documentation for details and code stroke times. Patient with left facial droop and decreased sensation of the left face on exam. The following imaging was completed:  CT, CTA, MRI. Patient is not a candidate for IV Thrombolytic at this time due to being too mild to treat. Patient is not a candidate for IR due to no suspected LVO.  Care/Plan: Return to inpatient bed. Q30 NIHSs and vital signs until 1110 then Q2. Bedside handoff with 6E RN.  Meda Klinefelter  Stroke Response RN

## 2021-10-07 NOTE — Progress Notes (Signed)
Pt to MRI with 6E RN- handoff received.  HR 70s NSR on tele. Telebox removed for CMR. Patient placed on heart monitor for scanner. HR continues to be 75bpm NSR.  Patient alert and oriented at beginning of exam. 6E RN states modified NIHSS due at 1:00p.

## 2021-10-07 NOTE — Progress Notes (Addendum)
Advanced Heart Failure Rounding Note  PCP-Cardiologist: Kardie Tobb, DO   Subjective:    R/LHC 2/7 showed normal coronaries and normal filling pressures.   Developed acute left sided facial numbness during cMRI this morning. Rapid response call. Neuro consulted. STAT head CT negative for acute findings. + chronic appearing lacunar infarcts at the left basal ganglia. Brain MRI w/ Punctate acute infarct in the high right frontal cortex. No edema or mass effect. Poor visualization of the right M1 MCA flow void. This could be artifactual, but CTA or MRA is recommended to exclude significant stenosis or occlusion. CT angio of head/ neck interpretation pending.   Left sided facial numbness currently resolved. 5/5 upper/LE strength bilaterally   From cardiac standpoint, continues w/ SOB. Difficulty laying flat for MRI. Wt down 1 lb. No current CP. Anxious about new HF diagnosis.   BP normotensive. CBC and BMP normal.    LHC/RHC:  Coronary Findings  Diagnostic Dominance: Right Left Main  Vessel was injected. Vessel is normal in caliber. Vessel is angiographically normal.    Left Anterior Descending  Vessel was injected. Vessel is normal in caliber. Vessel is angiographically normal.    Left Circumflex  Vessel was injected. Vessel is normal in caliber. Vessel is angiographically normal.    Right Coronary Artery  Vessel was injected. Vessel is normal in caliber. Vessel is angiographically normal.    Intervention   No interventions have been documented.   Right Heart  Right Heart Pressures RHC Procedural Findings: Hemodynamics (mmHg) RA mean 2 RV 22/1 PA 20/12, mean 13 PCWP mean 7 LV 125/26 AO 124/73  Oxygen saturations: PA 63% AO 97%  Cardiac Output (Fick) 3.42  Cardiac Index (Fick) 2.23  Cardiac Output (Thermo) 4.29 Cardiac Index (Thermo) 2.79     Objective:   Weight Range: 54.5 kg Body mass index is 23.18 kg/m.   Vital Signs:   Temp:  [97.6 F (36.4  C)-97.8 F (36.6 C)] 97.6 F (36.4 C) (02/08 0521) Pulse Rate:  [81-92] 81 (02/08 0521) Resp:  [18-20] 19 (02/08 0521) BP: (101-112)/(66-89) 102/73 (02/08 0830) SpO2:  [96 %] 96 % (02/08 0521) Weight:  [54.5 kg] 54.5 kg (02/08 0521) Last BM Date: 10/04/21  Weight change: Filed Weights   10/05/21 1536 10/06/21 0426 10/07/21 0521  Weight: 56.9 kg 54.9 kg 54.5 kg    Intake/Output:   Intake/Output Summary (Last 24 hours) at 10/07/2021 0907 Last data filed at 10/07/2021 0606 Gross per 24 hour  Intake 660 ml  Output 2950 ml  Net -2290 ml      Physical Exam    General:  anxious appearing WM. No respiratory difficulty HEENT: normal Neck: supple. no JVD. Carotids 2+ bilat; no bruits. No lymphadenopathy or thyromegaly appreciated. Cor: PMI nondisplaced. Regular rate & rhythm. No rubs, gallops or murmurs. Lungs: clear Abdomen: soft, nontender, nondistended. No hepatosplenomegaly. No bruits or masses. Good bowel sounds. Extremities: no cyanosis, clubbing, rash, edema Neuro: alert & oriented x 3, cranial nerves grossly intact. moves all 4 extremities w/o difficulty. Affect pleasant.    Telemetry   SR 70s-80s  Labs    CBC Recent Labs    10/06/21 0410 10/07/21 0509  WBC 7.8 7.1  HGB 12.8 13.5  HCT 38.4 39.6  MCV 91.9 92.3  PLT 155 401   Basic Metabolic Panel Recent Labs    10/06/21 0410 10/07/21 0509  NA 140 137  K 3.6 4.4  CL 106 105  CO2 25 23  GLUCOSE 96 97  BUN 11 16  CREATININE 0.68 0.78  CALCIUM 9.1 9.1  MG 2.2  --    Liver Function Tests Recent Labs    10/05/21 1551  AST 27  ALT 18  ALKPHOS 75  BILITOT 0.4  PROT 7.1  ALBUMIN 3.9   No results for input(s): LIPASE, AMYLASE in the last 72 hours. Cardiac Enzymes No results for input(s): CKTOTAL, CKMB, CKMBINDEX, TROPONINI in the last 72 hours.  BNP: BNP (last 3 results) Recent Labs    08/25/21 1516 10/05/21 1551  BNP 442.8* 635.9*    ProBNP (last 3 results) No results for input(s):  PROBNP in the last 8760 hours.   D-Dimer No results for input(s): DDIMER in the last 72 hours. Hemoglobin A1C Recent Labs    10/06/21 0410  HGBA1C 5.4   Fasting Lipid Panel Recent Labs    10/06/21 0410  CHOL 158  HDL 50  LDLCALC 89  TRIG 96  CHOLHDL 3.2   Thyroid Function Tests Recent Labs    10/05/21 1902  TSH 18.030*    Other results:   Imaging    CT Angio Chest Pulmonary Embolism (PE) W or WO Contrast  Result Date: 10/06/2021 CLINICAL DATA:  Shortness of breath question pulmonary embolism, clinically high suspicion EXAM: CT ANGIOGRAPHY CHEST WITH CONTRAST TECHNIQUE: Multidetector CT imaging of the chest was performed using the standard protocol during bolus administration of intravenous contrast. Multiplanar CT image reconstructions and MIPs were obtained to evaluate the vascular anatomy. RADIATION DOSE REDUCTION: This exam was performed according to the departmental dose-optimization program which includes automated exposure control, adjustment of the mA and/or kV according to patient size and/or use of iterative reconstruction technique. CONTRAST:  86mL OMNIPAQUE IOHEXOL 350 MG/ML SOLN IV COMPARISON:  None FINDINGS: Cardiovascular: Pulmonary arteries adequately opacified and patent. No evidence of pulmonary embolism. Scattered atherosclerotic calcifications aorta and coronary arteries. 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 Enlargement of cardiac chambers especially LEFT ventricle. Minimal pericardial fluid. Mediastinum/Nodes: Base of cervical region normal appearance. Esophagus unremarkable. No thoracic adenopathy. Lungs/Pleura: Emphysematous changes with central peribronchial thickening consistent with COPD. Minimal nonspecific mosaic attenuation in the lungs. Biapical scarring. No acute infiltrate, pleural effusion, or pneumothorax. 4 mm RIGHT upper lobe nodule image 42. Upper Abdomen: Visualized upper abdomen unremarkable Musculoskeletal: No  acute osseous findings. Review of the MIP images confirms the above findings. IMPRESSION: No evidence of pulmonary embolism. 4 mm RIGHT upper lobe nodule; no follow-up needed if patient is low-risk. Non-contrast chest CT can be considered in 12 months if patient is high-risk. This recommendation follows the consensus statement: Guidelines for Management of Incidental Pulmonary Nodules Detected on CT Images: From the Fleischner Society 2017; Radiology 2017; 284:228-243. Enlargement of cardiac chambers especially LEFT ventricle. 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 This recommendation follows 2010 ACCF/AHA/AATS/ACR/ASA/SCA/SCAI/SIR/STS/SVM Guidelines for the Diagnosis and Management of Patients with Thoracic Aortic Disease. Circulation. 2010; 121: A355-D322. Aortic aneurysm NOS (ICD10-I71.9) Aortic Atherosclerosis (ICD10-I70.0) Emphysema (ICD10-J43.9). Aortic aneurysm NOS (ICD10-I71.9). Electronically Signed   By: Lavonia Dana M.D.   On: 10/06/2021 10:09   MR BRAIN WO CONTRAST  Result Date: 10/07/2021 CLINICAL DATA:  Neuro deficit, acute, stroke suspected code stroke EXAM: MRI HEAD WITHOUT CONTRAST TECHNIQUE: Multiplanar, multiecho pulse sequences of the brain and surrounding structures were obtained without intravenous contrast. COMPARISON:  CT head from the same day. FINDINGS: Brain: Punctate acute infarct in the high right frontal cortex (series 2 and 250, image 39). No  edema or mass effect. No acute hemorrhage, hydrocephalus, mass lesion, midline shift, or extra-axial fluid collection. Mild to moderate scattered T2/FLAIR hyperintensities in the white matter, nonspecific but compatible with chronic microvascular ischemic disease. Vascular: Poor visualization of the right M1 MCA flow void. Skull and upper cervical spine: Normal marrow signal. Craniocervical degenerative change. Sinuses/Orbits: Clear sinuses.  Unremarkable orbits. Other:  Trace mastoid effusions. Preliminary findings discussed with Dr Reeves Forth via telephone at 8:32 a.m. IMPRESSION: 1. 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 No edema or mass effect. 2. Poor visualization of the right M1 MCA flow void. This could be artifactual, but CTA or MRA is recommended to exclude significant stenosis or occlusion. 3. Mild-to-moderate microvascular ischemic disease. Electronically Signed   By: Margaretha Sheffield M.D.   On: 10/07/2021 08:45   CT HEAD CODE STROKE WO CONTRAST`  Result Date: 10/07/2021 CLINICAL DATA:  Code stroke. EXAM: CT HEAD WITHOUT CONTRAST TECHNIQUE: Contiguous axial images were obtained from the base of the skull through the vertex without intravenous contrast. RADIATION DOSE REDUCTION: This exam was performed according to the departmental dose-optimization program which includes automated exposure control, adjustment of the mA and/or kV according to patient size and/or use of iterative reconstruction technique. COMPARISON:  None. FINDINGS: Brain: No evidence of acute infarction, hemorrhage, hydrocephalus, extra-axial collection or mass lesion/mass effect. Chronic appearing Lacunes at the left caudate head and left upper putamen. Vascular: No hyperdense vessel or unexpected calcification. Skull: Normal. Negative for fracture or focal lesion. Sinuses/Orbits: No acute finding. Other: These results were communicated to Dr Reeves Forth at 8:03 am on 10/07/2021 by text page via the [facility] messaging system. ASPECTS [facility] Stroke Program Early CT Score) Not scored without localizing symptom IMPRESSION: 1. No acute finding. 2. Chronic appearing lacunar infarcts at the left basal ganglia. Electronically Signed   By: Jorje Guild M.D.   On: 10/07/2021 08:04   ECHOCARDIOGRAM LIMITED  Result Date: 10/06/2021    ECHOCARDIOGRAM LIMITED REPORT   Patient Name:   Stacey Hartman Date of Exam: 10/06/2021 Medical Rec #:  [id number]      Height:       60.4 in Accession #:    [id number]      Weight:       121.0 lb Date of Birth:  05/03/1956      BSA:          1.514 m Patient Age:    65 years       BP:           107/67 mmHg Patient Gender: F              HR:           77 bpm. Exam Location:  Inpatient Procedure: 2D Echo, Cardiac Doppler, Color Doppler and Limited Echo Indications:    Pericardial effusion  History:        Patient has prior history of Echocardiogram examinations.                 Pulmonary HTN; Arrythmias:LBBB.  Sonographer:    Jyl Heinz Referring Phys: [place]  1. No left ventricular thrombus is seen. Left ventricular ejection fraction, by estimation, is <20%. The left ventricle has severely decreased function. The left ventricle demonstrates global hypokinesis. The left ventricular internal cavity size was moderately to severely dilated. Left ventricular diastolic parameters are consistent with Grade I diastolic dysfunction (impaired relaxation).  2. Right ventricular systolic function is normal. The right ventricular size  is normal. There is normal pulmonary artery systolic pressure.  3. Left atrial size was severely dilated.  4. The mitral valve is normal in structure. Mild mitral valve regurgitation.  5. The aortic valve is tricuspid. Aortic valve regurgitation is not visualized. No aortic stenosis is present.  6. The inferior vena cava is normal in size with greater than 50% respiratory variability, suggesting right atrial pressure of 3 mmHg. Comparison(s): No significant change from prior study. Prior images reviewed side by side. FINDINGS  Left Ventricle: No left ventricular thrombus is seen. Left ventricular ejection fraction, by estimation, is <20%. The left ventricle has severely decreased function. The left ventricle demonstrates global hypokinesis. The left ventricular internal cavity size was moderately to severely dilated. Abnormal (paradoxical) septal motion, consistent with left bundle branch block. Left ventricular diastolic parameters are  consistent with Grade I diastolic dysfunction (impaired relaxation). Normal left ventricular filling pressure. Right Ventricle: The right ventricular size is normal. No increase in right ventricular wall thickness. Right ventricular systolic function is normal. There is normal pulmonary artery systolic pressure. The tricuspid regurgitant velocity is 2.45 m/s, and  with an assumed right atrial pressure of 3 mmHg, the estimated right ventricular systolic pressure is 54.6 mmHg. Left Atrium: Left atrial size was severely dilated. Right Atrium: Right atrial size was normal in size. Pericardium: Trivial pericardial effusion is present. Mitral Valve: The mitral valve is normal in structure. Mild mitral valve regurgitation. Tricuspid Valve: The tricuspid valve is normal in structure. Tricuspid valve regurgitation is mild. Aortic Valve: The aortic valve is tricuspid. Aortic valve regurgitation is not visualized. No aortic stenosis is present. Aortic valve peak gradient measures 3.9 mmHg. Pulmonic Valve: The pulmonic valve was normal in structure. Pulmonic valve regurgitation is not visualized. Aorta: The aortic root and ascending aorta are structurally normal, with no evidence of dilitation. Venous: The inferior vena cava is normal in size with greater than 50% respiratory variability, suggesting right atrial pressure of 3 mmHg. IAS/Shunts: No atrial level shunt detected by color flow Doppler. LEFT VENTRICLE PLAX 2D LVIDd:         6.60 cm Diastology LVIDs:         5.90 cm LV e' medial:    3.57 cm/s LV PW:         1.10 cm LV E/e' medial:  14.2 LV IVS:        1.00 cm LV e' lateral:   5.43 cm/s                        LV E/e' lateral: 9.3  RIGHT VENTRICLE            IVC RV S prime:     9.24 cm/s  IVC diam: 1.60 cm TAPSE (M-mode): 2.0 cm LEFT ATRIUM         Index LA diam:    4.90 cm 3.24 cm/m  AORTIC VALVE AV Vmax:      99.00 cm/s AV Peak Grad: 3.9 mmHg LVOT Vmax:    84.20 cm/s LVOT Vmean:   60.300 cm/s LVOT VTI:     0.137 m   AORTA Ao Asc diam: 3.60 cm MITRAL VALVE               TRICUSPID VALVE MV Area (PHT): 4.24 cm    TR Peak grad:   24.0 mmHg MV Decel Time: 179 msec    TR Vmax:        245.00 cm/s MV E velocity: 50.60 cm/s  MV A velocity: 87.40 cm/s  SHUNTS MV E/A ratio:  0.58        Systemic VTI: 0.14 m Mihai Croitoru MD Electronically signed by Sanda Klein MD Signature Date/Time: 10/06/2021/12:59:56 PM    Final      Medications:     Scheduled Medications:  carvedilol  3.125 mg Oral BID WC   dapagliflozin propanediol  10 mg Oral Daily   diazepam  5 mg Intravenous Once   enoxaparin (LOVENOX) injection  40 mg Subcutaneous Q24H   gabapentin  300 mg Oral QHS   levothyroxine  150 mcg Oral Q0600   LORazepam       LORazepam  0.5 mg Intravenous Once   sodium chloride flush  3 mL Intravenous Q12H   sodium chloride flush  3 mL Intravenous Q12H   sodium chloride flush  3 mL Intravenous Q12H   spironolactone  12.5 mg Oral Daily    Infusions:  sodium chloride     sodium chloride      PRN Medications: sodium chloride, sodium chloride, acetaminophen, magnesium hydroxide, ondansetron (ZOFRAN) IV, sodium chloride flush, sodium chloride flush    Patient Profile   Stacey Hartman is a 66 year old with a history of hypothyroidism, former tobacco abuse (quit 20 years ago), COPD, and last week had ECHO with severely reduced EF.    Presenting with progressive chest pain and increased shortness of breath.   Assessment/Plan   1. Acute systolic CHF: Nonischemic cardiomyopathy. Etiology not certain. No CAD on LHC. Has LBBB of uncertain duration.  She has a strong family history of CHF (father died at 33, brother with "CHF," nephew with "CHF" recently died as well). 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   - Echo done last week showed EF < 20% with diffuse hypokinesis and normal RV.  No LV thrombus noted.  - R/LHC 02/06: No significant coronary disease, normal RA pressure and PCWP, LVEDP 28 mmHg, preserved CO - Received 40 mg  lasix IV on 02/06. Volume okay today.  - Continue coreg 3.125 mg BID - Continue spiro 12.5 mg daily - Continue Farxiga 10 mg daily  - needs completion of cMRI to r/o infiltrative disease, myocarditis  2. Mitral valve regurgitation: - Mild to moderate on recent echo - Mild on limited echo yesterday   3. Chest pain: No CAD on LHC. - HS troponin negative X 2 - CTA negative for PE   4. Hypothyroidism: - TSH elevated 18. Reports noncompliance w/ levothyroxine at home  -  levothyroxine 150 mcg restarted - needs f/u w/ PCP   5. COPD/prior tobacco use:  Says she quit smoking 20 years ago.  - Chest CT + for emphysema + 4 mm RUL Pulmonary nodule - Need f/u scan in 12 months - Needs outpatient pulmonology follow-up and PFTs   6. Left Sided Numbness - Code Stroke called 2/8  - head CT negative for acute findings  - brain MRI +Punctate acute infarct in the high right frontal cortex. No edema or mass effect. Poor visualization of the right M1 MCA flow void. CTA of head/neck pending  - neurology following appreciate their assistance - frequent neuro checks - add statin (LDL 89 mg/dL)  7. 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 Continue ? blocker - Add statin  Length of Stay: 2  Lyda Jester, PA-C  10/07/2021, 9:07 AM  Advanced Heart Failure Team Pager [phone] (M-F; 7a - 5p)  Please contact [facility]  for night-coverage after hours (5p -7a ) and weekends on [email or web address]   Patient seen with PA, agree with the above note.   Patient was feeling fine earlier this morning.  She went down for cardiac MRI.  She was very anxious in the MRI, lorazepam 0.5 mg IV had been ordered but she never got it. She started hyperventilating and developed numbness on the left side of her face and left side of her body.  Code stroke called, CT head with no acute changes and CTA head/neck showed no significant vascular disease.  However, MRI  showed punctate acute infarct in the high right frontal cortex.  Symptoms have mostly resolved at this point and TPA was not given.   SBP generally around 110.  She is in NSR, no atrial fibrillation has been noted.  Echo reviewed, no LV thrombus.   General: NAD Neck: No JVD, no thyromegaly or thyroid nodule.  Lungs: Clear to auscultation bilaterally with normal respiratory effort. CV: Nondisplaced PMI.  Heart regular S1/S2, no S3/S4, no murmur.  No peripheral edema.   Abdomen: Soft, nontender, no hepatosplenomegaly, no distention.  Skin: Intact without lesions or rashes.  Neurologic: Alert and oriented x 3. Normal strength bilaterally.  Psych: Normal affect. Extremities: No clubbing or cyanosis.  HEENT: Normal.   New small CVA in the high right frontal cortex, no head/neck vascular disease noted. No atrial fibrillation has been noted.  No LV thrombus has been noted. Symptoms have mostly resolved.  - Neurology followup, recommending ASA and Plavix which is reasonable from my standpoint.  - Adding statin.  - Would favor LINQ monitoring to look for atrial fibrillation if none is seen in the hospital.  - Further workup per neurology.   Cause of cardiomyopathy remains uncertain.  She has LBBB.  She seems to have a strong family history of cardiomyopathy.  She is well-compensated from CHF standpoint and we had been titrating up her meds.  Will pause titration today given CVA to allow her to stabilize.  - Continue current Coreg, Farxiga, spironolactone.  - She started a cardiac MRI but did not finish (?myocarditis, ?infiltrative disease).  She thinks she could finish if she got mild sedation prior.  Will reassess tomorrow, may be able to do then.   Elevated TSH suggestive of hypothyroidism.  Send free T3 and free T4.   Loralie Champagne 10/07/2021 11:09 AM

## 2021-10-07 NOTE — Consult Note (Addendum)
Stroke Neurology Consultation Note  Consult Requested by: Code stroke   Reason for Consult: Left facial droop and left side numbness   Consult Date: 10/07/21   The history was obtained from the patient and Rapid response.  During history and examination, all items were  able to obtain unless otherwise noted.  History of Present Illness:  Stacey Hartman is a 66 y.o. Caucasian female with PMH of hypothyroidism, former smoker, COPD and CHF who was in MRI for a cardiac study when she pressed the button stating she could not breath. She was on heparin gtt for CP/ACS which was turned off 2/7 @ 1032. MRI tech noted a left facial droop and left side numbness and Code stroke was called. CT head negative. Stat MRI revealed 1. 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 No edema or mass effect. 2. Poor visualization of the right M1 MCA flow void. This could be artifactual, but CTA or MRA is recommended to exclude significant stenosis or occlusion. 3. Mild-to-moderate microvascular ischemic disease. An 2 mg IV ativan for MRI study placed but ultimately she didn't need it.  She states she has had these symptoms before last time about 1 month ago however this time was more severe. CTA obtained    LSN: [id number] tPA Given: No: NIHSS 2 with mild symptoms  1a Level of Conscious.: 0 1b LOC Questions: 0 1c LOC Commands: 0 2 Best Gaze: 0 3 Visual: 0 4 Facial Palsy: 1 5a Motor Arm - left: 0 5b Motor Arm - Right: 0 6a Motor Leg - Left: 0 6b Motor Leg - Right: 0 7 Limb Ataxia: 0 8 Sensory: 1 9 Best Language: 0 10 Dysarthria: 0 11 Extinct. and Inatten.: 0 TOTAL: 2   Past Medical History:  Diagnosis Date   Hypothyroid     Past Surgical History:  Procedure Laterality Date   RIGHT/LEFT HEART CATH AND CORONARY ANGIOGRAPHY N/A 10/05/2021   Procedure: RIGHT/LEFT HEART CATH AND CORONARY ANGIOGRAPHY;  Surgeon: Larey Dresser, MD;  Location: [facility];  Service: Cardiovascular;  Laterality:  N/A;    Family History  Problem Relation Age of Onset   Heart attack Father    Heart failure Brother     Social History:  reports that she has quit smoking. She has never used smokeless tobacco. She reports that she does not currently use alcohol. She reports that she does not use drugs.  Allergies:  Allergies  Allergen Reactions   Penicillins     Trouble breathing     No current facility-administered medications on file prior to encounter.   Current Outpatient Medications on File Prior to Encounter  Medication Sig Dispense Refill   esomeprazole (NEXIUM) 20 MG capsule Take 1 capsule by mouth daily.     gabapentin (NEURONTIN) 300 MG capsule Take 300 mg by mouth at bedtime.     levothyroxine (SYNTHROID) 150 MCG tablet Take 150 mcg by mouth daily before breakfast.      Review of Systems: A full ROS was attempted today and was  able to be performed.  Systems assessed include - Constitutional, Eyes, HENT, Respiratory, Cardiovascular, Gastrointestinal, Genitourinary, Integument/breast, Hematologic/lymphatic, Musculoskeletal, Neurological, Behavioral/Psych, Endocrine, Allergic/Immunologic - with pertinent responses as per HPI.  Physical Examination: Temp:  [97.6 F (36.4 C)-97.8 F (36.6 C)] 97.6 F (36.4 C) (02/08 0521) Pulse Rate:  [81-92] 81 (02/08 0521) Resp:  [18-20] 19 (02/08 0521) BP: (101-112)/(66-89) 101/71 (02/08 0521) SpO2:  [96 %] 96 % (02/08 0521) Weight:  [  54.5 kg] 54.5 kg (02/08 0521)  General - well nourished, well developed, in no apparent distress.    Ophthalmologic - fundi not visualized due to noncooperation.    Cardiovascular - regular rhythm and rate  Mental Status -  Level of arousal and orientation to time, place, and person were intact. Language including expression, naming, repetition, comprehension, reading, and writing was assessed and found intact. Attention span and concentration were normal. Recent and remote memory were intact. Fund of  Knowledge was assessed and was intact.  Cranial Nerves II - XII - II - Vision intact OU. III, IV, VI - Extraocular movements intact. V - Facial sensation intact bilaterally. VII - (mild)left facial droop. VIII - Hearing & vestibular intact bilaterally. X - Palate elevates symmetrically. XI - Chin turning & shoulder shrug intact bilaterally. XII - Tongue protrusion intact.  Motor Strength - The patients strength was normal in all extremities and pronator drift was absent.   Motor Tone & Bulk - Muscle tone was assessed at the neck and appendages and was normal.  Bulk was normal and fasciculations were absent.   Sensory - normal in ext. Decreased in left lip/face.  Coordination - The patient had normal movements in the hands and feet with no ataxia or dysmetria.  Tremor was absent.  Gait and Station - deferred  NIHSS: for left facial droop. Left facial numbness.   Data Reviewed: DG Chest 2 View  Result Date: 10/05/2021 CLINICAL DATA:  Chest pain, shortness of breath EXAM: CHEST - 2 VIEW COMPARISON:  10/01/2021 FINDINGS: Stable cardiomediastinal contours. Aortic atherosclerosis. Diffuse bilateral interstitial prominence is similar to the previous study. Lungs are hyperexpanded. No new focal airspace consolidation. No pleural effusion or pneumothorax. Exaggerated thoracic kyphosis. IMPRESSION: Unchanged bilateral interstitial prominence, which may reflect bronchitic type lung changes. No new airspace consolidation. Electronically Signed   By: Davina Poke D.O.   On: 10/05/2021 16:51   DG Chest 2 View  Result Date: 10/03/2021 CLINICAL DATA:  66 year old female with history of worsening productive cough for the past 3 weeks. EXAM: CHEST - 2 VIEW COMPARISON:  No priors. FINDINGS: Lung volumes are normal. No consolidative airspace disease. However, there is widespread interstitial prominence and diffuse peribronchial cuffing. No pleural effusions. No pneumothorax. No pulmonary nodule or mass  noted. Pulmonary vasculature and the cardiomediastinal silhouette are within normal limits. Atherosclerosis in the thoracic aorta. IMPRESSION: 1. The appearance the chest suggests acute bronchitis. 2. Aortic atherosclerosis. Electronically Signed   By: Vinnie Langton M.D.   On: 10/03/2021 16:33   CT Angio Chest Pulmonary Embolism (PE) W or WO Contrast  Result Date: 10/06/2021 CLINICAL DATA:  Shortness of breath question pulmonary embolism, clinically high suspicion EXAM: CT ANGIOGRAPHY CHEST WITH CONTRAST TECHNIQUE: Multidetector CT imaging of the chest was performed using the standard protocol during bolus administration of intravenous contrast. Multiplanar CT image reconstructions and MIPs were obtained to evaluate the vascular anatomy. RADIATION DOSE REDUCTION: This exam was performed according to the departmental dose-optimization program which includes automated exposure control, adjustment of the mA and/or kV according to patient size and/or use of iterative reconstruction technique. CONTRAST:  61mL OMNIPAQUE IOHEXOL 350 MG/ML SOLN IV COMPARISON:  None FINDINGS: Cardiovascular: Pulmonary arteries adequately opacified and patent. No evidence of pulmonary embolism. Scattered atherosclerotic calcifications aorta and coronary arteries. 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 Enlargement of cardiac chambers especially LEFT ventricle. Minimal pericardial fluid. Mediastinum/Nodes: Base of cervical region normal appearance. Esophagus unremarkable. No thoracic adenopathy. Lungs/Pleura: Emphysematous changes with  central peribronchial thickening consistent with COPD. Minimal nonspecific mosaic attenuation in the lungs. Biapical scarring. No acute infiltrate, pleural effusion, or pneumothorax. 4 mm RIGHT upper lobe nodule image 42. Upper Abdomen: Visualized upper abdomen unremarkable Musculoskeletal: No acute osseous findings. Review of the MIP images confirms the above findings.  IMPRESSION: No evidence of pulmonary embolism. 4 mm RIGHT upper lobe nodule; no follow-up needed if patient is low-risk. Non-contrast chest CT can be considered in 12 months if patient is high-risk. This recommendation follows the consensus statement: Guidelines for Management of Incidental Pulmonary Nodules Detected on CT Images: From the Fleischner Society 2017; Radiology 2017; 284:228-243. Enlargement of cardiac chambers especially LEFT ventricle. 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 This recommendation follows 2010 ACCF/AHA/AATS/ACR/ASA/SCA/SCAI/SIR/STS/SVM Guidelines for the Diagnosis and Management of Patients with Thoracic Aortic Disease. Circulation. 2010; 121: Q595-G387. Aortic aneurysm NOS (ICD10-I71.9) Aortic Atherosclerosis (ICD10-I70.0) Emphysema (ICD10-J43.9). Aortic aneurysm NOS (ICD10-I71.9). Electronically Signed   By: Lavonia Dana M.D.   On: 10/06/2021 10:09   CARDIAC CATHETERIZATION  Result Date: 10/05/2021 1. Normal RA pressure and PCWP though LVEDP was elevated.  PCWP was repeated and remained low. 2. Preserved cardiac output. 3. Normal PA pressure. 4. No significant coronary disease. Nonischemic cardiomyopathy.  This catheterization does not explain the patient's quite significant dyspnea and chest pain.  Will get CTA chest in the morning (had contrast with cath so will wait until tomorrow) to rule out PE (unlikely with normal troponin and PA pressure) and also evaluate lung fields.  Will cover with heparin gtt until then.   ECHOCARDIOGRAM COMPLETE  Result Date: 10/01/2021    ECHOCARDIOGRAM REPORT   Patient Name:   Stacey Hartman Date of Exam: 10/01/2021 Medical Rec #:  [id number]      Height:       64.0 in Accession #:    [id number]     Weight:       128.0 lb Date of Birth:  Apr 24, 1956      BSA:          1.618 m Patient Age:    53 years       BP:           135/70 mmHg Patient Gender: F              HR:           79  bpm. Exam Location:  [facility] Procedure: 2D Echo, Cardiac Doppler and Color Doppler Indications:    Dyspnea  History:        Patient has no prior history of Echocardiogram examinations.                 Signs/Symptoms:Dyspnea; Risk Factors:Former Smoker.  Sonographer:    Wenda Low Referring Phys: [place]  1. Left ventricular ejection fraction, by estimation, is <20%. The left ventricle has severely decreased function. The left ventricle demonstrates global hypokinesis. The left ventricular internal cavity size was moderately dilated. Left ventricular diastolic parameters are consistent with Grade I diastolic dysfunction (impaired relaxation).  2. Right ventricular systolic function is normal. The right ventricular size is normal. There is mildly elevated pulmonary artery systolic pressure. The estimated right ventricular systolic pressure is 16.0 mmHg.  3. Left atrial size was moderately dilated.  4. Right atrial size was moderately dilated.  5. The mitral valve is degenerative. Mild to moderate mitral valve regurgitation. No evidence of mitral stenosis.  6. The aortic valve  is normal in structure. Aortic valve regurgitation is not visualized. No aortic stenosis is present.  7. The inferior vena cava is normal in size with greater than 50% respiratory variability, suggesting right atrial pressure of 3 mmHg. FINDINGS  Left Ventricle: Left ventricular ejection fraction, by estimation, is <20%. The left ventricle has severely decreased function. The left ventricle demonstrates global hypokinesis. The left ventricular internal cavity size was moderately dilated. There is no left ventricular hypertrophy. Left ventricular diastolic parameters are consistent with Grade I diastolic dysfunction (impaired relaxation). Right Ventricle: The right ventricular size is normal. No increase in right ventricular wall thickness. Right ventricular systolic function is normal. There is mildly elevated  pulmonary artery systolic pressure. The tricuspid regurgitant velocity is 3.04  m/s, and with an assumed right atrial pressure of 8 mmHg, the estimated right ventricular systolic pressure is 09.7 mmHg. Left Atrium: Left atrial size was moderately dilated. Right Atrium: Right atrial size was moderately dilated. Pericardium: There is no evidence of pericardial effusion. Mitral Valve: The mitral valve is degenerative in appearance. There is mild thickening of the mitral valve leaflet(s). There is mild calcification of the mitral valve leaflet(s). Mild to moderate mitral valve regurgitation, with posteriorly-directed jet.  No evidence of mitral valve stenosis. MV peak gradient, 6.0 mmHg. The mean mitral valve gradient is 3.0 mmHg. Tricuspid Valve: The tricuspid valve is normal in structure. Tricuspid valve regurgitation is mild . No evidence of tricuspid stenosis. Aortic Valve: The aortic valve is normal in structure. Aortic valve regurgitation is not visualized. No aortic stenosis is present. Aortic valve mean gradient measures 3.0 mmHg. Aortic valve peak gradient measures 5.2 mmHg. Aortic valve area, by VTI measures 2.22 cm. Pulmonic Valve: The pulmonic valve was normal in structure. Pulmonic valve regurgitation is not visualized. No evidence of pulmonic stenosis. Aorta: The aortic root is normal in size and structure. Venous: The inferior vena cava is normal in size with greater than 50% respiratory variability, suggesting right atrial pressure of 3 mmHg. IAS/Shunts: No atrial level shunt detected by color flow Doppler.  LEFT VENTRICLE PLAX 2D LVIDd:         6.60 cm      Diastology LVIDs:         5.70 cm      LV e' medial:    4.25 cm/s LV PW:         1.00 cm      LV E/e' medial:  21.3 LV IVS:        1.00 cm      LV e' lateral:   8.74 cm/s LVOT diam:     2.00 cm      LV E/e' lateral: 10.3 LV SV:         51 LV SV Index:   32 LVOT Area:     3.14 cm  LV Volumes (MOD) LV vol d, MOD A2C: 231.0 ml LV vol d, MOD A4C: 244.0  ml LV vol s, MOD A2C: 176.0 ml LV vol s, MOD A4C: 193.0 ml LV SV MOD A2C:     55.0 ml LV SV MOD A4C:     244.0 ml LV SV MOD BP:      54.4 ml RIGHT VENTRICLE RV Basal diam:  3.25 cm RV Mid diam:    2.30 cm RV S prime:     13.30 cm/s TAPSE (M-mode): 2.1 cm LEFT ATRIUM             Index  RIGHT ATRIUM           Index LA diam:        5.10 cm 3.15 cm/m   RA Area:     16.60 cm LA Vol (A2C):   71.0 ml 43.87 ml/m  RA Volume:   50.40 ml  31.14 ml/m LA Vol (A4C):   74.0 ml 45.73 ml/m LA Biplane Vol: 74.1 ml 45.79 ml/m  AORTIC VALVE                    PULMONIC VALVE AV Area (Vmax):    2.36 cm     PV Vmax:       0.89 m/s AV Area (Vmean):   2.31 cm     PV Peak grad:  3.1 mmHg AV Area (VTI):     2.22 cm AV Vmax:           114.00 cm/s AV Vmean:          73.400 cm/s AV VTI:            0.231 m AV Peak Grad:      5.2 mmHg AV Mean Grad:      3.0 mmHg LVOT Vmax:         85.50 cm/s LVOT Vmean:        53.900 cm/s LVOT VTI:          0.163 m LVOT/AV VTI ratio: 0.71  AORTA Ao Root diam: 3.10 cm Ao Asc diam:  3.30 cm MITRAL VALVE                TRICUSPID VALVE MV Area (PHT): 5.66 cm     TR Peak grad:   37.0 mmHg MV Area VTI:   2.03 cm     TR Vmax:        304.00 cm/s MV Peak grad:  6.0 mmHg MV Mean grad:  3.0 mmHg     SHUNTS MV Vmax:       1.22 m/s     Systemic VTI:  0.16 m MV Vmean:      79.4 cm/s    Systemic Diam: 2.00 cm MV Decel Time: 134 msec MV E velocity: 90.40 cm/s MV A velocity: 113.00 cm/s MV E/A ratio:  0.80 Candee Furbish MD Electronically signed by Candee Furbish MD Signature Date/Time: 10/01/2021/3:27:40 PM    Final    CT HEAD CODE STROKE WO CONTRAST`  Result Date: 10/07/2021 CLINICAL DATA:  Code stroke. EXAM: CT HEAD WITHOUT CONTRAST TECHNIQUE: Contiguous axial images were obtained from the base of the skull through the vertex without intravenous contrast. RADIATION DOSE REDUCTION: This exam was performed according to the departmental dose-optimization program which includes automated exposure control, adjustment of  the mA and/or kV according to patient size and/or use of iterative reconstruction technique. COMPARISON:  None. FINDINGS: Brain: No evidence of acute infarction, hemorrhage, hydrocephalus, extra-axial collection or mass lesion/mass effect. Chronic appearing Lacunes at the left caudate head and left upper putamen. Vascular: No hyperdense vessel or unexpected calcification. Skull: Normal. Negative for fracture or focal lesion. Sinuses/Orbits: No acute finding. Other: These results were communicated to Dr Reeves Forth at 8:03 am on 10/07/2021 by text page via the [facility] messaging system. ASPECTS [facility] Stroke Program Early CT Score) Not scored without localizing symptom IMPRESSION: 1. No acute finding. 2. Chronic appearing lacunar infarcts at the left basal ganglia. Electronically Signed   By: Jorje Guild M.D.   On: 10/07/2021 08:04   ECHOCARDIOGRAM LIMITED  Result Date: 10/06/2021  ECHOCARDIOGRAM LIMITED REPORT   Patient Name:   Stacey Hartman Date of Exam: 10/06/2021 Medical Rec #:  [id number]      Height:       60.4 in Accession #:    [id number]     Weight:       121.0 lb Date of Birth:  02-02-1956      BSA:          1.514 m Patient Age:    45 years       BP:           107/67 mmHg Patient Gender: F              HR:           77 bpm. Exam [facility] Procedure: 2D Echo, Cardiac Doppler, Color Doppler and Limited Echo Indications:    Pericardial effusion  History:        Patient has prior history of Echocardiogram examinations.                 Pulmonary HTN; Arrythmias:LBBB.  Sonographer:    Jyl Heinz Referring Phys: [place]  1. No left ventricular thrombus is seen. Left ventricular ejection fraction, by estimation, is <20%. The left ventricle has severely decreased function. The left ventricle demonstrates global hypokinesis. The left ventricular internal cavity size was moderately to severely dilated. Left ventricular diastolic parameters are consistent with Grade I diastolic  dysfunction (impaired relaxation).  2. Right ventricular systolic function is normal. The right ventricular size is normal. There is normal pulmonary artery systolic pressure.  3. Left atrial size was severely dilated.  4. The mitral valve is normal in structure. Mild mitral valve regurgitation.  5. The aortic valve is tricuspid. Aortic valve regurgitation is not visualized. No aortic stenosis is present.  6. The inferior vena cava is normal in size with greater than 50% respiratory variability, suggesting right atrial pressure of 3 mmHg. Comparison(s): No significant change from prior study. Prior images reviewed side by side. FINDINGS  Left Ventricle: No left ventricular thrombus is seen. Left ventricular ejection fraction, by estimation, is <20%. The left ventricle has severely decreased function. The left ventricle demonstrates global hypokinesis. The left ventricular internal cavity size was moderately to severely dilated. Abnormal (paradoxical) septal motion, consistent with left bundle branch block. Left ventricular diastolic parameters are consistent with Grade I diastolic dysfunction (impaired relaxation). Normal left ventricular filling pressure. Right Ventricle: The right ventricular size is normal. No increase in right ventricular wall thickness. Right ventricular systolic function is normal. There is normal pulmonary artery systolic pressure. The tricuspid regurgitant velocity is 2.45 m/s, and  with an assumed right atrial pressure of 3 mmHg, the estimated right ventricular systolic pressure is 65.4 mmHg. Left Atrium: Left atrial size was severely dilated. Right Atrium: Right atrial size was normal in size. Pericardium: Trivial pericardial effusion is present. Mitral Valve: The mitral valve is normal in structure. Mild mitral valve regurgitation. Tricuspid Valve: The tricuspid valve is normal in structure. Tricuspid valve regurgitation is mild. Aortic Valve: The aortic valve is tricuspid. Aortic valve  regurgitation is not visualized. No aortic stenosis is present. Aortic valve peak gradient measures 3.9 mmHg. Pulmonic Valve: The pulmonic valve was normal in structure. Pulmonic valve regurgitation is not visualized. Aorta: The aortic root and ascending aorta are structurally normal, with no evidence of dilitation. Venous: The inferior vena cava is normal in size with greater than 50% respiratory variability, suggesting right atrial pressure  of 3 mmHg. IAS/Shunts: No atrial level shunt detected by color flow Doppler. LEFT VENTRICLE PLAX 2D LVIDd:         6.60 cm Diastology LVIDs:         5.90 cm LV e' medial:    3.57 cm/s LV PW:         1.10 cm LV E/e' medial:  14.2 LV IVS:        1.00 cm LV e' lateral:   5.43 cm/s                        LV E/e' lateral: 9.3  RIGHT VENTRICLE            IVC RV S prime:     9.24 cm/s  IVC diam: 1.60 cm TAPSE (M-mode): 2.0 cm LEFT ATRIUM         Index LA diam:    4.90 cm 3.24 cm/m  AORTIC VALVE AV Vmax:      99.00 cm/s AV Peak Grad: 3.9 mmHg LVOT Vmax:    84.20 cm/s LVOT Vmean:   60.300 cm/s LVOT VTI:     0.137 m  AORTA Ao Asc diam: 3.60 cm MITRAL VALVE               TRICUSPID VALVE MV Area (PHT): 4.24 cm    TR Peak grad:   24.0 mmHg MV Decel Time: 179 msec    TR Vmax:        245.00 cm/s MV E velocity: 50.60 cm/s MV A velocity: 87.40 cm/s  SHUNTS MV E/A ratio:  0.58        Systemic VTI: 0.14 m Dani Gobble Croitoru MD Electronically signed by Sanda Klein MD Signature Date/Time: 10/06/2021/12:59:56 PM    Final     Assessment: 66 y.o. female was in MRI for a cardiac study when she pressed the button stating she could not breath. MRI tech noted a left facial droop and left side numbness and Code stroke was called. CT head negative. MRI brain Punctate acute infarct in the high right frontal cortex with Poor visualization of the right M1 MCA flow void. CTA head/neck NO LVO.   Stroke risk factors: CHF  Neuro  Acute/subacute right punctate infarct due to probable cardio embolic and small  vessel disease  Cardio  CHFrEF   Cardiac cath  2/6 LHC/RHC:   Coronary Findings   Diagnostic Dominance: Right Left Main  Vessel was injected. Vessel is normal in caliber. Vessel is angiographically normal.    Left Anterior Descending  Vessel was injected. Vessel is normal in caliber. Vessel is angiographically normal.    Left Circumflex  Vessel was injected. Vessel is normal in caliber. Vessel is angiographically normal.    Right Coronary Artery  Vessel was injected. Vessel is normal in caliber. Vessel is angiographically normal.    Intervention    No interventions have been documented.   Echo: EF <20%. No interatrial shunt.  LDL-c 89 EKG NSR   Endocrine  - HGBA1c 5.4 -maintain euglycemia   Plan: - PT consult, OT consult, Speech consult - Prophylactic therapy-Antiplatelet med: Aspirin - dose 81mg  and Plavix 75mg  for 3 weeks then ASA only when ok with cardiology  - Risk factor modification - Telemetry monitoring - Frequent neuro checks  Thank you for this consultation and allowing [place] to participate in the care of this patient.   Beulah Gandy, DNP, ACNPC-AG  ATTENDING ATTESTATION:  Code stroke during cardiac MRI. NIHSS 2. MRI  show punctuate stroke,  CTA obtained. Pt examined multiple times by me before CT, MRI and before/after and stable. Con't to closely monitor with q 30 min neuro checks during stroke window. DAPT therapy for 3 weeks, then monotherapy.   Discussed with stroke nurse after pt returned to [place], and she remained stable.   Stroke team to follow.  Dr. Reeves Forth evaluated pt independently, reviewed imaging, chart, labs. Discussed and formulated plan with the APP. Please see APP note above for details.     This patient is critically ill due to Code stroke with urgent evaluation for TNK/Intervention and at significant risk of neurological worsening, death form heart failure, respiratory failure, recurrent stroke, bleeding from [facility], seizure, sepsis. This  patient's care requires constant monitoring of vital signs, hemodynamics, respiratory and cardiac monitoring, review of multiple databases, neurological assessment, discussion with family, other specialists and medical decision making of high complexity. I spent 60 minutes of neurocritical care time in the care of this patient.   Shilee Biggs,MD

## 2021-10-07 NOTE — Evaluation (Signed)
Speech Language Pathology Evaluation Patient Details Name: Stacey Hartman MRN: [id number] DOB: 12-03-1955 Today's Date: 10/07/2021 Time: 1045-1100 SLP Time Calculation (min) (ACUTE ONLY): 15 min  Problem List:  Patient Active Problem List   Diagnosis Date Noted   Depressed left ventricular ejection fraction 10/05/2021   Pulmonary hypertension, unspecified ([place]) 10/05/2021   Nonrheumatic mitral valve regurgitation 10/05/2021   LBBB (left bundle branch block) 19/41/7408   Acute systolic heart failure ([place]) 10/05/2021   DOE (dyspnea on exertion) 08/25/2021   Past Medical History:  Past Medical History:  Diagnosis Date   Hypothyroid    Past Surgical History:  Past Surgical History:  Procedure Laterality Date   RIGHT/LEFT HEART CATH AND CORONARY ANGIOGRAPHY N/A 10/05/2021   Procedure: RIGHT/LEFT HEART CATH AND CORONARY ANGIOGRAPHY;  Surgeon: Larey Dresser, MD;  Location: [facility];  Service: Cardiovascular;  Laterality: N/A;   HPI:  Stacey Hartman is a 66 y.o. Caucasian female with PMH of hypothyroidism, former smoker, COPD and CHF. MRI tech noted a left facial droop and left side numbness and Code stroke was called. CT head negative. Stat MRI revealed 1. 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 No edema or mass effect. 2. Poor visualization of the right M1 MCA flow void. This could be artifactual, but CTA or MRA is recommended to exclude significant stenosis or occlusion. 3. Mild-to-moderate microvascular ischemic disease.   Assessment / Plan / Recommendation Clinical Impression  Pt was seen at bedside for cognitive-linguistic evaluation. Pt denied any baseline impairments with cognition and language. Highest level of education reported is a GED. [facility] Mental Status (SLUMS) Examination was completed to evalaute the pt's cognitive-linguistic skills. Pt scored 28/30, which is within functional limits. Pt had minor difficulty with depicting  accurate time during clock drawing. Pt expressed that cognition and language have returned to baseline since admission to [facility]. SLP services are not clinically indicated at this time for cognition and lanugage. SLP to sign off.    SLP Assessment  SLP Recommendation/Assessment: Patient does not need any further Speech [place] [facility] SLP Visit Diagnosis: Cognitive communication deficit (R41.841)    Recommendations for follow up therapy are one component of a multi-disciplinary discharge planning process, led by the attending physician.  Recommendations may be updated based on patient status, additional functional criteria and insurance authorization.    Follow Up Recommendations  No SLP follow up    Assistance Recommended at Discharge  PRN  Functional Status Assessment Patient has had a recent decline in their functional status and demonstrates the ability to make significant improvements in function in a reasonable and predictable amount of time.  Frequency and Duration           SLP Evaluation Cognition  Overall Cognitive Status: Within Functional Limits for tasks assessed Arousal/Alertness: Awake/alert Orientation Level: Oriented X4 Year: 2023 Day of Week: Correct Attention: Alternating Alternating Attention: Appears intact Memory: Appears intact Awareness: Appears intact Problem Solving: Appears intact Safety/Judgment: Appears intact       Comprehension  Auditory Comprehension Overall Auditory Comprehension: Appears within functional limits for tasks assessed Yes/No Questions: Not tested Commands: Not tested Conversation: Complex Visual Recognition/Discrimination Discrimination: Not tested Reading Comprehension Reading Status: Not tested    Expression Expression Primary Mode of Expression: Verbal Verbal Expression Overall Verbal Expression: Appears within functional limits for tasks assessed Initiation: No impairment Automatic Speech: Name;Social  Response Level of Generative/Spontaneous Verbalization: Conversation Repetition:  (Not Tested) Naming: Not tested Pragmatics: No impairment  Effective Techniques: Open ended questions Non-Verbal Means of Communication: Not applicable Written Expression Dominant Hand: Right Written Expression: Not tested   Oral / Motor  Oral Motor/Sensory Function Overall Oral Motor/Sensory Function: Within functional limits Motor Speech Overall Motor Speech: Appears within functional limits for tasks assessed Respiration: Within functional limits Phonation: Normal Resonance: Within functional limits Articulation: Within functional limitis Intelligibility: Intelligible Motor Planning: Witnin functional limits Motor Speech Errors: Not applicable Interfering Components: Inadequate dentition (Partial prosthetic plate unavailable)            Vaughan Sine 10/07/2021, 11:51 AM

## 2021-10-07 NOTE — Evaluation (Signed)
Occupational Therapy Evaluation Patient Details Name: Stacey Hartman MRN: [id number] DOB: 28-Jun-1956 Today's Date: 10/07/2021   History of Present Illness Patient is a 66 yo female admitted to the ED on 10/05/21 due to chest pain and shortness of breath. Patient having ECHO 10/01/21 with ejection fraction of less than 20%. Code stroke called on 10/07/21 when patient was recieving MRI. Developed acute left sided facial numbness during MRI. Rapid response call. Neuro consulted. STAT head CT negative for acute findings. + chronic appearing lacunar infarcts at the left basal ganglia. Brain MRI w/ Punctate acute infarct in the high right frontal cortex. No edema or mass effect. Poor visualization of the right M1 MCA flow void. CT angio of head/ neck interpretation pending. Left sided facial numbness resolved on 10/07/21.   Clinical Impression    Prior to this admission, patient was independent with mobility and ADLs, and living with her boyfriend. Patient endorses she does not drive, but her boyfriend drives her and she is able to complete everything she needs to do. Patient is very active and often helps out neighbors in her community. Patient currently reporting fatigue, and "doesn't feel like I can go as far as I used to". Patient endorsing numbness and tingling over L side of body, but fine motor and gross motor coordination remain intact. Patient is at set up level for ADLs, and demonstrates decreased activity tolerance, requiring rest breaks between tasks. Patient would benefit from skilled OT acutely to address problem list below, as well as recommending HHOT at discharge to return to previous level of independence.    Recommendations for follow up therapy are one component of a multi-disciplinary discharge planning process, led by the attending physician.  Recommendations may be updated based on patient status, additional functional criteria and insurance authorization.   Follow Up Recommendations  Home  health OT    Assistance Recommended at Discharge Set up Supervision/Assistance  Patient can return home with the following Assistance with cooking/housework;Assist for transportation;A little help with bathing/dressing/bathroom;A little help with walking and/or transfers;Help with stairs or ramp for entrance    Functional Status Assessment  Patient has had a recent decline in their functional status and demonstrates the ability to make significant improvements in function in a reasonable and predictable amount of time.  Equipment Recommendations  Other (comment) (Single point cane)    Recommendations for Other Services       Precautions / Restrictions Precautions Precautions: None Restrictions Weight Bearing Restrictions: No      Mobility Bed Mobility Overal bed mobility: Modified Independent             General bed mobility comments: extra time, use of bed rails for stability, HOB elevated    Transfers Overall transfer level: Needs assistance Equipment used: 1 person hand held assist Transfers: Sit to/from Stand Sit to Stand: Min assist           General transfer comment: Standing with RN at beginning of session at sink, minimially unstable with ambulation reaching for surfaces, states that this is not her baseline      Balance Overall balance assessment: Mild deficits observed, not formally tested                                         ADL either performed or assessed with clinical judgement   ADL Overall ADL's : Needs assistance/impaired Eating/Feeding: Independent   Grooming:  Set up;Wash/dry hands;Wash/dry face;Oral care;Standing Grooming Details (indicate cue type and reason): Standing at sink with nurse upon entry Upper Body Bathing: Set up   Lower Body Bathing: Set up   Upper Body Dressing : Set up   Lower Body Dressing: Set up   Toilet Transfer: Set up;Ambulation   Toileting- Clothing Manipulation and Hygiene: Set up        Functional mobility during ADLs: Set up General ADL Comments: Patient presenting with overall decreased activity tolerance and endurance     Vision Baseline Vision/History: 1 Wears glasses Ability to See in Adequate Light: 0 Adequate Patient Visual Report: No change from baseline Vision Assessment?: No apparent visual deficits     Perception     Praxis      Pertinent Vitals/Pain Pain Assessment Pain Assessment: No/denies pain     Hand Dominance Right   Extremity/Trunk Assessment Upper Extremity Assessment Upper Extremity Assessment: Overall WFL for tasks assessed;LUE deficits/detail LUE Deficits / Details: Code stroke called 2/8 AM, patient stating tingling/numbness on entire L side of body, has sensation, but has "less feeling" in comparison to R side LUE Sensation: decreased light touch LUE Coordination: WNL   Lower Extremity Assessment Lower Extremity Assessment: Defer to PT evaluation (Same numbness and tingling being felt in L leg)   Cervical / Trunk Assessment Cervical / Trunk Assessment: Kyphotic   Communication Communication Communication: No difficulties   Cognition Arousal/Alertness: Awake/alert Behavior During Therapy: WFL for tasks assessed/performed Overall Cognitive Status: Within Functional Limits for tasks assessed                                       General Comments       Exercises     Shoulder Instructions      Home Living Family/patient expects to be discharged to:: Private residence (Mobile home) Living Arrangements: Spouse/significant other (Boyfriend) Available Help at Discharge: Family Type of Home: Mobile home Home Access: Stairs to enter Technical brewer of Steps: 4 Entrance Stairs-Rails: Can reach both Home Layout: One level     Bathroom Shower/Tub: Walk-in shower (Has seat in shower)   Bathroom Toilet: Standard Bathroom Accessibility: No   Home Equipment: None          Prior  Functioning/Environment Prior Level of Function : Independent/Modified Independent (Patient does not drive, boyfriend drives her where she needs to go)             Mobility Comments: Independent ADLs Comments: Independent        OT Problem List: Decreased strength;Decreased activity tolerance;Impaired balance (sitting and/or standing);Cardiopulmonary status limiting activity;Impaired sensation;Decreased safety awareness      OT Treatment/Interventions: Self-care/ADL training;Therapeutic exercise;Neuromuscular education;Energy conservation;DME and/or AE instruction;Therapeutic activities;Patient/family education;Balance training    OT Goals(Current goals can be found in the care plan section) Acute Rehab OT Goals Patient Stated Goal: to get back to my normal self OT Goal Formulation: With patient Time For Goal Achievement: 10/21/21 Potential to Achieve Goals: Good  OT Frequency: Min 2X/week    Co-evaluation              AM-PAC OT "6 Clicks" Daily Activity     Outcome Measure Help from another person eating meals?: None Help from another person taking care of personal grooming?: A Little Help from another person toileting, which includes using toliet, bedpan, or urinal?: A Little Help from another person bathing (including washing, rinsing, drying)?: A Little  Help from another person to put on and taking off regular upper body clothing?: A Little Help from another person to put on and taking off regular lower body clothing?: A Little 6 Click Score: 19   End of Session Nurse Communication: Mobility status  Activity Tolerance: Patient tolerated treatment well Patient left: in bed;with call bell/phone within reach  OT Visit Diagnosis: Unsteadiness on feet (R26.81);Other abnormalities of gait and mobility (R26.89);Muscle weakness (generalized) (M62.81);Other symptoms and signs involving the nervous system (R29.898)                Time: 3794-4461 OT Time Calculation (min):  15 min Charges:  OT General Charges $OT Visit: 1 Visit OT Evaluation $OT Eval Moderate Complexity: 1 Mod  Corinne Ports E. Ivania Teagarden, OTR/L Acute Rehabilitation Services [phone] [place] 10/07/2021, 1:17 PM

## 2021-10-08 ENCOUNTER — Other Ambulatory Visit (HOSPITAL_COMMUNITY): Payer: Self-pay | Admitting: Cardiology

## 2021-10-08 DIAGNOSIS — G43109 Migraine with aura, not intractable, without status migrainosus: Secondary | ICD-10-CM

## 2021-10-08 DIAGNOSIS — I69392 Facial weakness following cerebral infarction: Secondary | ICD-10-CM

## 2021-10-08 DIAGNOSIS — I63411 Cerebral infarction due to embolism of right middle cerebral artery: Secondary | ICD-10-CM

## 2021-10-08 LAB — BASIC METABOLIC PANEL
Anion gap: 9 (ref 5–15)
BUN: 14 mg/dL (ref 8–23)
CO2: 22 mmol/L (ref 22–32)
Calcium: 8.9 mg/dL (ref 8.9–10.3)
Chloride: 107 mmol/L (ref 98–111)
Creatinine, Ser: 0.79 mg/dL (ref 0.44–1.00)
GFR, Estimated: >60 mL/min (ref >60)
Glucose, Bld: 98 mg/dL (ref 70–99)
Potassium: 4.4 mmol/L (ref 3.5–5.1)
Sodium: 138 mmol/L (ref 135–145)

## 2021-10-08 LAB — CBC
HCT: 38.8 % (ref 36.0–46.0)
Hemoglobin: 13 g/dL (ref 12.0–15.0)
MCH: 31.4 pg (ref 26.0–34.0)
MCHC: 33.5 g/dL (ref 30.0–36.0)
MCV: 93.7 fL (ref 80.0–100.0)
Platelets: 173 10*3/uL (ref 150–400)
RBC: 4.14 MIL/uL (ref 3.87–5.11)
RDW: 14 % (ref 11.5–15.5)
WBC: 7.2 10*3/uL (ref 4.0–10.5)
nRBC: 0 % (ref 0.0–0.2)

## 2021-10-08 LAB — T3, FREE: T3, Free: 2.9 pg/mL (ref 2.0–4.4)

## 2021-10-08 MED ORDER — SPIRONOLACTONE 25 MG PO TABS
25.0000 mg | ORAL_TABLET | Freq: Every day | ORAL | Status: DC
  Administered 2021-10-08 – 2021-10-09 (×2): 25 mg via ORAL
  Filled 2021-10-08 (×2): qty 1

## 2021-10-08 MED ORDER — SERTRALINE HCL 50 MG PO TABS
25.0000 mg | ORAL_TABLET | Freq: Every day | ORAL | Status: DC
  Filled 2021-10-08: qty 1

## 2021-10-08 MED ORDER — CLOPIDOGREL BISULFATE 75 MG PO TABS
75.0000 mg | ORAL_TABLET | Freq: Every day | ORAL | Status: DC
  Administered 2021-10-08 – 2021-10-09 (×2): 75 mg via ORAL
  Filled 2021-10-08 (×2): qty 1

## 2021-10-08 MED ORDER — LOSARTAN POTASSIUM 25 MG PO TABS
12.5000 mg | ORAL_TABLET | Freq: Two times a day (BID) | ORAL | Status: DC
  Administered 2021-10-08 – 2021-10-09 (×3): 12.5 mg via ORAL
  Filled 2021-10-08 (×3): qty 1

## 2021-10-08 MED ORDER — ALPRAZOLAM 0.5 MG PO TABS
0.5000 mg | ORAL_TABLET | Freq: Three times a day (TID) | ORAL | Status: DC | PRN
  Administered 2021-10-08: 0.5 mg via ORAL
  Filled 2021-10-08 (×2): qty 1

## 2021-10-08 MED ORDER — FUROSEMIDE 10 MG/ML IJ SOLN
20.0000 mg | Freq: Once | INTRAMUSCULAR | Status: AC
Start: 2021-10-08 — End: 2021-10-08
  Administered 2021-10-08: 20 mg via INTRAVENOUS
  Filled 2021-10-08: qty 2

## 2021-10-08 NOTE — TOC CM/SW Note (Addendum)
HF TOC CM spoke to pt and states she does want cane for home. She prefers HH RN/PT. Offered choice and pt agreeable to agency that will service her area and accept her insurance. [place], Heart Failure TOC CM [phone]

## 2021-10-08 NOTE — TOC Progression Note (Signed)
Transition of Care [facility]) - Progression Note    Patient Details  Name: Stacey Hartman MRN: [id number] Date of Birth: [birth year]  Transition of Care [facility]) CM/SW Contact  Milus Fritze, LCSW Phone Number: 10/08/2021, 4:25 PM  Clinical Narrative:    HF CSW spoke to [name] about DME and she reported she would like both a cane and a walker and if possible a hospital bed as she can't sleep laying flat and only sitting up. HF CSW notified the HF RNCM about DME needs and [name] request for a hospital bed and a cane and walker.  CSW will continue to follow throughout discharge.     Expected Discharge Plan: Home/Self Care Barriers to Discharge: Continued Medical Work up  Expected Discharge Plan and Services Expected Discharge Plan: Home/Self Care   Discharge Planning Services: CM Consult   Living arrangements for the past 2 months: Single Family Home                                       Social Determinants of Health (SDOH) Interventions    Readmission Risk Interventions No flowsheet data found.  Corianna Avallone, MSW, [place] Heart Failure Social Worker

## 2021-10-08 NOTE — Progress Notes (Addendum)
STROKE TEAM PROGRESS NOTE   SUBJECTIVE (INTERVAL HISTORY) She is seen at the bedside alone.  Overall her condition is stable. Patient reports that while in the cMRI, she had the onset of a severe HA and L lower facial (V2-V3 distribution), lip, and tongue numbness began as well as tingling in the fingertips of her L hand. She reports that this is not the first time this has occurred. One other time, she also had a HA (not as severe as this one), followed by numbness and tingling in her face. She describes feeling "stressed and anxious" frequently but reports a history of mania after taking Zoloft in the past. Patient reports residual numbness in her tongue and L face but denies HA.  MRI with punctate acute infarct in R frontal cortex. Mild-mod microvascular ischemic disease.   OBJECTIVE CBC:  Recent Labs  Lab 10/07/21 0509 10/08/21 0408  WBC 7.1 7.2  HGB 13.5 13.0  HCT 39.6 38.8  MCV 92.3 93.7  PLT 166 297   Basic Metabolic Panel:  Recent Labs  Lab 10/06/21 0410 10/07/21 0509 10/08/21 0408  NA 140 137 138  K 3.6 4.4 4.4  CL 106 105 107  CO2 25 23 22   GLUCOSE 96 97 98  BUN 11 16 14   CREATININE 0.68 0.78 0.79  CALCIUM 9.1 9.1 8.9  MG 2.2  --   --    Lipid Panel:  Recent Labs  Lab 10/06/21 0410  CHOL 158  TRIG 96  HDL 50  CHOLHDL 3.2  VLDL 19  LDLCALC 89   HgbA1c:  Recent Labs  Lab 10/06/21 0410  HGBA1C 5.4   Urine Drug Screen: No results for input(s): LABOPIA, COCAINSCRNUR, LABBENZ, AMPHETMU, THCU, LABBARB in the last 168 hours.  Alcohol Level No results for input(s): ETH in the last 168 hours.  IMAGING past 24 hours No results found.   PHYSICAL EXAM Temp:  [97.7 F (36.5 C)-98.2 F (36.8 C)] 97.7 F (36.5 C) (02/09 0646) Pulse Rate:  [71-90] 71 (02/09 0646) Resp:  [16-17] 17 (02/09 0646) BP: (89-108)/(58-77) 97/70 (02/09 1014) SpO2:  [95 %-98 %] 96 % (02/09 0646)  General - Well nourished, well developed, woman age congruent but with poor  dentition, in no apparent distress.  Cardiovascular - Regular rhythm and rate on telemetry.  Mental Status -  Level of arousal and orientation to time, place, and person were intact. Language including expression, naming, repetition, comprehension was assessed and found intact. Attention span and concentration were normal. Recent and remote memory were intact. Recall 2/3 (but remembered vehicle) Fund of Knowledge was assessed and was intact.  Cranial Nerves II - XII - II - Visual field intact OU. III, IV, VI - Extraocular movements intact. V - Facial sensation with R>L in V2 and V3 distribution. VII - Facial movement intact bilaterally. VIII - Hearing & vestibular intact bilaterally. X - Palate elevates symmetrically. XI - Chin turning & shoulder shrug intact bilaterally. XII - Tongue protrusion intact.  Motor Strength - The patients strength was normal in all extremities and pronator drift was absent.  Bulk was normal and fasciculations were absent.   Motor Tone - Muscle tone was assessed at the neck and appendages and was normal.  Sensory - Light touch was assessed and  symmetrical in all extremitied  Coordination - The patient had normal movements in the hands and feet with no ataxia or dysmetria.  Tremor was absent.  Gait and Station - deferred.    ASSESSMENT/PLAN Ms. Lincoln Brigham  Stacey Hartman is a 66 y.o. female with history of COPD, CHF, hypothyroidism, and former smoker presenting with L facial droop and L sided facial numbness while in cMRI.   Stroke: right punctate infarct of R frontal cortex likely secondary to complication from cardiac cath. Although cardioembolic source can not be ruled out given low EF, it is less likely given the isolated punctate stroke  Code Stroke CT head No acute abnormality. Chronic lacunar infarcts of L basal ganglia CTA head & neck No large vessel occlusions, significant stenosis, or evidence of dissection in the head or neck MRI  Punctate acute  infarct in R frontal cortex. Mild-mod microvascular ischemic disease. 2D Echo LVEF <20%, global hypokinesis of LV, grade I diastolic dysfunction, severely dilated LA, Mild MVR, Tricuspid aortic valve LDL 89 HgbA1c 5.4 VTE prophylaxis - SQ Lovenox No antithrombotic prior to admission, now on aspirin 81 mg daily. Will defer to cardiology for further antithrombotic regimen  Therapy recommendations:  HHOT, OP or HHPT Disposition:  Home upon d/c  ? Complicated HA/migraine Pt had similar left lip and hand numbness in the past with HA This time HA after MRI more severe followed by left lip and left hand numbness, now only slight left lip and left finger tip tingling feeling Could be complicated HA symptoms.  Supportive care  CHF EF < 20% Cardiology on board Cardiac cath 2/6 without stenosis recommend 30 day monitor for surveillance now on aspirin 81 mg daily. Will defer to cardiology for further antithrombotic regimen   Hypertension Assessment Home meds:  N/A Stable SBP goal <160 Long-term BP goal normotensive  Hyperlipidemia Home meds: None LDL 89, goal < 70 Continue atorvastatin 40 mg  Continue statin at discharge  Diabetes type II Assessment Home meds:  N/A HgbA1c 5.4, goal < 7.0 CBGs SSI  Other Stroke Risk Factors Advanced Age >/= 28  Former cigarette smoker- advised to maintain abstinence from smoking  Other Active Problems Anxiety:  Patient may benefit from an increased or more frequent dose of Gabapentin for anxiety. SSRI as monotherapy is contraindicated in this patient, as she has previously had a manic episode after starting an SSRI.   Hospital day # 3   Rosezetta Schlatter, MD Stroke Neurology- Neuro Psych Resident 10/08/2021 11:25 AM  ATTENDING NOTE: I reviewed above note and agree with the assessment and plan. Pt was seen and examined.   66 year old female with history of CHF, former smoker, COPD admitted for CHF work-up.  Had cardiac cath 2/6 without  significant finding.  When having cardiac MRI yesterday she developed headache and left face and arm numbness.  Reported left facial droop also.  CT no acute abnormality.  MRI showed right frontal cortex punctate infarct.  CTA head and neck unremarkable.  Currently symptoms all resolved except slight tingling sensation at left upper lip and left fingertips.  EF less than 20%, no LV thrombus.  LDL 89, A1c 5.4.  Creatinine 0.78.  Patient also stated that she had similar episode in the past with mild headache.  On exam, patient awake alert, orientated x3, no aphasia, no dysarthria, follows simple commands.  Neurologically no deficit except subjective tingling/numbness left upper lip and left fingertips.  Etiology for patient symptoms not quite clear, punctate right MCA stroke versus complicated headache/migraine.  The punctate right MCA stroke could be related to cardiac cath procedure on 2/6, but felt less likely for cardioembolic source with low EF given only one isolated punctate infarct.  Recommend continue aspirin 81 and further antithrombotic regimen  per cardiology.  Recommend 30-day cardiac event monitoring to rule out A-fib given cardiomyopathy.  Continue Lipitor 40.  For detailed assessment and plan, please refer to above as I have made changes wherever appropriate.   Neurology will sign off. Please call with questions. Pt will follow up with stroke clinic NP at [facility] in about 4 weeks. Thanks for the consult.   Rosalin Hawking, MD PhD Stroke Neurology 10/08/2021 3:33 PM    To contact Stroke Continuity provider, please refer to [email or web address] After hours, contact General Neurology

## 2021-10-08 NOTE — Progress Notes (Addendum)
Advanced Heart Failure Rounding Note  PCP-Cardiologist: Kardie Tobb, DO   Subjective:    R/LHC 2/7 showed normal coronaries and normal filling pressures.   CODE Stroke 2/8 during cMRI. CT head negative. MRI brain showed Punctate acute infarct in the high right frontal cortex with Poor visualization of the right M1 MCA flow void. CTA head/neck NO LVO. NSR on tele. No afib detection this admit.   Neuro deficits resolved.   cMRI completed. Interpretation pending.   Feels better today. Still anxious. Breathing improved. SBPs 110s. Asking if she can go home today.    LHC/RHC:  Coronary Findings  Diagnostic Dominance: Right Left Main  Vessel was injected. Vessel is normal in caliber. Vessel is angiographically normal.    Left Anterior Descending  Vessel was injected. Vessel is normal in caliber. Vessel is angiographically normal.    Left Circumflex  Vessel was injected. Vessel is normal in caliber. Vessel is angiographically normal.    Right Coronary Artery  Vessel was injected. Vessel is normal in caliber. Vessel is angiographically normal.    Intervention   No interventions have been documented.   Right Heart  Right Heart Pressures RHC Procedural Findings: Hemodynamics (mmHg) RA mean 2 RV 22/1 PA 20/12, mean 13 PCWP mean 7 LV 125/26 AO 124/73  Oxygen saturations: PA 63% AO 97%  Cardiac Output (Fick) 3.42  Cardiac Index (Fick) 2.23  Cardiac Output (Thermo) 4.29 Cardiac Index (Thermo) 2.79     Objective:   Weight Range: 54.5 kg Body mass index is 23.18 kg/m.   Vital Signs:   Temp:  [97.7 F (36.5 C)-98.2 F (36.8 C)] 97.7 F (36.5 C) (02/09 0646) Pulse Rate:  [71-90] 71 (02/09 0646) Resp:  [16-19] 17 (02/09 0646) BP: (89-116)/(58-86) 108/61 (02/09 0646) SpO2:  [94 %-98 %] 96 % (02/09 0646) Last BM Date: 10/07/21  Weight change: Filed Weights   10/05/21 1536 10/06/21 0426 10/07/21 0521  Weight: 56.9 kg 54.9 kg 54.5 kg     Intake/Output:   Intake/Output Summary (Last 24 hours) at 10/08/2021 0710 Last data filed at 10/08/2021 0370 Gross per 24 hour  Intake 2086.33 ml  Output 1000 ml  Net 1086.33 ml      Physical Exam    General:  Well appearing, anxious appearing. No respiratory difficulty HEENT: normal Neck: supple. JVD 6-7 cm. Carotids 2+ bilat; no bruits. No lymphadenopathy or thyromegaly appreciated. Cor: PMI nondisplaced. Regular rate & rhythm. No rubs, gallops or murmurs. Lungs: clear Abdomen: soft, nontender, nondistended. No hepatosplenomegaly. No bruits or masses. Good bowel sounds. Extremities: no cyanosis, clubbing, rash, edema Neuro: alert & oriented x 3, cranial nerves grossly intact. moves all 4 extremities w/o difficulty. Affect pleasant.   Telemetry   SR 70s-80s. No Afib detection   Labs    CBC Recent Labs    10/07/21 0509 10/08/21 0408  WBC 7.1 7.2  HGB 13.5 13.0  HCT 39.6 38.8  MCV 92.3 93.7  PLT 166 488   Basic Metabolic Panel Recent Labs    10/06/21 0410 10/07/21 0509 10/08/21 0408  NA 140 137 138  K 3.6 4.4 4.4  CL 106 105 107  CO2 25 23 22   GLUCOSE 96 97 98  BUN 11 16 14   CREATININE 0.68 0.78 0.79  CALCIUM 9.1 9.1 8.9  MG 2.2  --   --    Liver Function Tests Recent Labs    10/05/21 1551  AST 27  ALT 18  ALKPHOS 75  BILITOT 0.4  PROT 7.1  ALBUMIN 3.9   No results for input(s): LIPASE, AMYLASE in the last 72 hours. Cardiac Enzymes No results for input(s): CKTOTAL, CKMB, CKMBINDEX, TROPONINI in the last 72 hours.  BNP: BNP (last 3 results) Recent Labs    08/25/21 1516 10/05/21 1551  BNP 442.8* 635.9*    ProBNP (last 3 results) No results for input(s): PROBNP in the last 8760 hours.   D-Dimer No results for input(s): DDIMER in the last 72 hours. Hemoglobin A1C Recent Labs    10/06/21 0410  HGBA1C 5.4   Fasting Lipid Panel Recent Labs    10/06/21 0410  CHOL 158  HDL 50  LDLCALC 89  TRIG 96  CHOLHDL 3.2   Thyroid  Function Tests Recent Labs    10/05/21 1902  TSH 18.030*    Other results:   Imaging    MR BRAIN WO CONTRAST  Result Date: 10/07/2021 CLINICAL DATA:  Neuro deficit, acute, stroke suspected code stroke EXAM: MRI HEAD WITHOUT CONTRAST TECHNIQUE: Multiplanar, multiecho pulse sequences of the brain and surrounding structures were obtained without intravenous contrast. COMPARISON:  CT head from the same day. FINDINGS: Brain: Punctate acute infarct in the high right frontal cortex (series 2 and 250, image 39). No edema or mass effect. No acute hemorrhage, hydrocephalus, mass lesion, midline shift, or extra-axial fluid collection. Mild to moderate scattered T2/FLAIR hyperintensities in the white matter, nonspecific but compatible with chronic microvascular ischemic disease. Vascular: Poor visualization of the right M1 MCA flow void. Skull and upper cervical spine: Normal marrow signal. Craniocervical degenerative change. Sinuses/Orbits: Clear sinuses.  Unremarkable orbits. Other: Trace mastoid effusions. Preliminary findings discussed with Dr Reeves Forth via telephone at 8:32 a.m. IMPRESSION: 1. 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 No edema or mass effect. 2. Poor visualization of the right M1 MCA flow void. This could be artifactual, but CTA or MRA is recommended to exclude significant stenosis or occlusion. 3. Mild-to-moderate microvascular ischemic disease. Electronically Signed   By: Margaretha Sheffield M.D.   On: 10/07/2021 08:45   CT HEAD CODE STROKE WO CONTRAST`  Result Date: 10/07/2021 CLINICAL DATA:  Code stroke. EXAM: CT HEAD WITHOUT CONTRAST TECHNIQUE: Contiguous axial images were obtained from the base of the skull through the vertex without intravenous contrast. RADIATION DOSE REDUCTION: This exam was performed according to the departmental dose-optimization program which includes automated exposure control, adjustment of the mA and/or kV according to patient size and/or use of  iterative reconstruction technique. COMPARISON:  None. FINDINGS: Brain: No evidence of acute infarction, hemorrhage, hydrocephalus, extra-axial collection or mass lesion/mass effect. Chronic appearing Lacunes at the left caudate head and left upper putamen. Vascular: No hyperdense vessel or unexpected calcification. Skull: Normal. Negative for fracture or focal lesion. Sinuses/Orbits: No acute finding. Other: These results were communicated to Dr Reeves Forth at 8:03 am on 10/07/2021 by text page via the [facility] messaging system. ASPECTS [facility] Stroke Program Early CT Score) Not scored without localizing symptom IMPRESSION: 1. No acute finding. 2. Chronic appearing lacunar infarcts at the left basal ganglia. Electronically Signed   By: Jorje Guild M.D.   On: 10/07/2021 08:04   CT ANGIO HEAD NECK W WO CM (CODE STROKE)  Result Date: 10/07/2021 CLINICAL DATA:  Acute stroke on MRI, possible abnormal MCA EXAM: CT ANGIOGRAPHY HEAD AND NECK TECHNIQUE: Multidetector CT imaging of the head and neck was performed using the standard protocol during bolus administration of intravenous contrast. Multiplanar CT image reconstructions and MIPs were obtained to evaluate the vascular anatomy. Carotid stenosis  measurements (when applicable) are obtained utilizing NASCET criteria, using the distal internal carotid diameter as the denominator. RADIATION DOSE REDUCTION: This exam was performed according to the departmental dose-optimization program which includes automated exposure control, adjustment of the mA and/or kV according to patient size and/or use of iterative reconstruction technique. CONTRAST:  18mL OMNIPAQUE IOHEXOL 350 MG/ML SOLN COMPARISON:  Correlation made with recent imaging FINDINGS: CTA NECK Aortic arch: Mild calcified plaque along the arch. Patent great vessel origins. Right carotid system: Patent.  No stenosis. Left carotid system: Patent. Minimal calcified plaque along the distal common carotid. No stenosis.  Vertebral arteries: Patent. Left vertebral is dominant. No stenosis. Skeleton: Cervical spine degenerative changes. Other neck: Unremarkable. Upper chest: Emphysema. Review of the MIP images confirms the above findings CTA HEAD Anterior circulation: Intracranial internal carotid arteries are patent with minimal calcified plaque. Anterior and middle cerebral arteries are patent. Posterior circulation: Intracranial vertebral arteries are patent. Basilar artery is patent. Major cerebral artery origins are patent. Right larger than left posterior communicating arteries are present. Posterior cerebral arteries are patent. Venous sinuses: Patent as allowed by contrast bolus timing. Review of the MIP images confirms the above findings IMPRESSION: No large vessel occlusion, hemodynamically significant stenosis, or evidence of dissection. The right M1 MCA is normal. Electronically Signed   By: Macy Mis M.D.   On: 10/07/2021 09:21     Medications:     Scheduled Medications:  aspirin  81 mg Oral Daily   atorvastatin  40 mg Oral Daily   carvedilol  3.125 mg Oral BID WC   dapagliflozin propanediol  10 mg Oral Daily   diazepam  5 mg Intravenous Once   enoxaparin (LOVENOX) injection  40 mg Subcutaneous Q24H   gabapentin  300 mg Oral QHS   levothyroxine  150 mcg Oral Q0600   sodium chloride flush  3 mL Intravenous Q12H   sodium chloride flush  3 mL Intravenous Q12H   sodium chloride flush  3 mL Intravenous Q12H   spironolactone  12.5 mg Oral Daily    Infusions:  sodium chloride     sodium chloride     sodium chloride 100 mL/hr at 10/08/21 0624    PRN Medications: sodium chloride, sodium chloride, acetaminophen, magnesium hydroxide, ondansetron (ZOFRAN) IV, sodium chloride flush, sodium chloride flush    Patient Profile   Ms Borelli is a 66 year old with a history of hypothyroidism, former tobacco abuse (quit 20 years ago), COPD, and last week had ECHO with severely reduced EF.    Presenting  with progressive chest pain and increased shortness of breath.   Assessment/Plan   1. Acute systolic CHF: Nonischemic cardiomyopathy. Etiology not certain. No CAD on LHC. Has LBBB of uncertain duration.  She has a strong family history of CHF (father died at 40, brother with "CHF," nephew with "CHF" recently died as well). 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   - Echo done last week showed EF < 20% with diffuse hypokinesis and normal RV.  No LV thrombus noted.  - R/LHC 02/06: No significant coronary disease, normal RA pressure and PCWP, LVEDP 28 mmHg, preserved CO - Received 40 mg lasix IV on 02/06. Volume okay today.  - Continue Coreg 3.125 mg BID - Continue Spiro 12.5 mg daily - Continue Farxiga 10 mg daily  - cRMI completed yesterday, interpretation pending   2. CVA: Code Stroke 2/8, left sided numbness + left facial droop.  - Head CT negative - Brain MRI + punctate acute infarct in the high  right frontal cortex - CTA Head and Neck no LVO  - Suspect infarct due to probable cardio embolic and small vessel disease - No Afib detection on tele this admit - Would favor LINQ for surveillance  - DAPT w/ ASA + Plavix x 3 weeks then monotherapy per neuro - Statin added, atrorva 40 daily (LDL 89 mg/dL)   3. Mitral valve regurgitation: - Mild to moderate on recent echo - Mild on limited echo this admit   4. Chest pain: No CAD on LHC. - HS troponin negative X 2 - CTA negative for PE   5. Hypothyroidism: - TSH elevated 18. Free T4 normal. Free T3 normal.  Reports noncompliance w/ levothyroxine at home.  -  levothyroxine 150 mcg restarted -  needs f/u w/ PCP   6. COPD/prior tobacco use:  Says she quit smoking 20 years ago.  - Chest CT + for emphysema + 4 mm RUL Pulmonary nodule - Need f/u scan in 12 months - Needs outpatient pulmonology follow-up and PFTs   7. 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 Continue ? blocker - on statin,  atorva 40 mg   Length of Stay: [address], PA-C  10/08/2021, 7:10 AM  Advanced Heart Failure Team Pager [phone] (M-F; 7a - 5p)  Please contact [facility] for night-coverage after hours (5p -7a ) and weekends on [email or web address]   Patient seen with PA, agree with the above note.   Anxious this morning.  Feels more short of breath, looks like she has been getting NS 100 cc/hr since her code stroke yesterday.    General: NAD Neck: JVP 8 cm with HJR, no thyromegaly or thyroid nodule.  Lungs: Clear to auscultation bilaterally with normal respiratory effort. CV: Lateral PMI.  Heart regular S1/S2, no S3/S4, no murmur.  No peripheral edema.   Abdomen: Soft, nontender, no hepatosplenomegaly, no distention.  Skin: Intact without lesions or rashes.  Neurologic: Alert and oriented x 3.  Psych: Normal affect. Extremities: No clubbing or cyanosis.  HEENT: Normal.   I reviewed yesterday's cMRI: LV EF is markedly low, no LV thrombus noted, no myocardial LGE.   Cause of cardiomyopathy remains obscure, could be familial with what sounds like strong FH of cardiomyopathy.  Suspect mild volume overload after getting 100 cc/hr NS all day yesterday and overnight.  - Stop NS - Lasix 20 mg IV x 1 - She does not have BP room for Entresto, will start losartan 12.5 mg bid.  - Increase spironolactone to 25 mg daily.  - Continue Coreg and [place].   She seems to have recovered completely from small CVA.  Discussed with EP, as she will likely need ICD in future, recommend 30 day monitor for AF rather than LINQ.  Will arrange at discharge.   Will start sertraline with significant anxiety.   Will watch today with iatrogenic volume overload, hopefully home tomorrow.   Loralie Champagne 10/08/2021 8:54 AM

## 2021-10-08 NOTE — Progress Notes (Signed)
CARDIAC REHAB PHASE I   PRE:  Rate/Rhythm: 85 SR    BP: sitting 112/77    SaO2: 96 RA  MODE:  Ambulation: 450 ft   POST:  Rate/Rhythm: 93 SR    BP: sitting 103/66     SaO2: 98 RA  Pt able to ambulate faster and longer than 2 days ago. SOB with distance and then CP, she rates 2-3/10. Rested in hall then returned to bed. Relief with rest. VSS. Discussed listening to her body and not pushing herself at home. Discussed HF booklet, which she had read. Gave HF and ICD video to view however video system not working currently.  [place], ACSM 10/08/2021 1:45 PM

## 2021-10-09 ENCOUNTER — Other Ambulatory Visit (HOSPITAL_COMMUNITY): Payer: Self-pay

## 2021-10-09 ENCOUNTER — Ambulatory Visit: Payer: 59 | Admitting: Allergy & Immunology

## 2021-10-09 ENCOUNTER — Inpatient Hospital Stay (HOSPITAL_COMMUNITY): Payer: 59

## 2021-10-09 ENCOUNTER — Inpatient Hospital Stay (HOSPITAL_COMMUNITY)
Admit: 2021-10-09 | Discharge: 2021-10-09 | Disposition: A | Discharge status: Home / Self Care | Payer: 59 | Attending: Cardiology | Admitting: Cardiology

## 2021-10-09 LAB — BASIC METABOLIC PANEL
Anion gap: 8 (ref 5–15)
BUN: 20 mg/dL (ref 8–23)
CO2: 25 mmol/L (ref 22–32)
Calcium: 9 mg/dL (ref 8.9–10.3)
Chloride: 105 mmol/L (ref 98–111)
Creatinine, Ser: 0.63 mg/dL (ref 0.44–1.00)
GFR, Estimated: >60 mL/min (ref >60)
Glucose, Bld: 97 mg/dL (ref 70–99)
Potassium: 3.9 mmol/L (ref 3.5–5.1)
Sodium: 138 mmol/L (ref 135–145)

## 2021-10-09 LAB — CBC
HCT: 36.6 % (ref 36.0–46.0)
Hemoglobin: 12.3 g/dL (ref 12.0–15.0)
MCH: 31 pg (ref 26.0–34.0)
MCHC: 33.6 g/dL (ref 30.0–36.0)
MCV: 92.2 fL (ref 80.0–100.0)
Platelets: 160 10*3/uL (ref 150–400)
RBC: 3.97 MIL/uL (ref 3.87–5.11)
RDW: 13.7 % (ref 11.5–15.5)
WBC: 6.2 10*3/uL (ref 4.0–10.5)
nRBC: 0 % (ref 0.0–0.2)

## 2021-10-09 IMAGING — NM NM SCAN TUMOR LOCALIZE WITH SPECT
4 series · 19 of 19 positions shown · non-contrast
Comparison: none

CLINICAL DATA: HEART FAILURE. CONCERN FOR CARDIAC AMYLOIDOSIS.

EXAM:
NUCLEAR MEDICINE TUMOR LOCALIZATION. PYP CARDIAC AMYLOIDOSIS SCAN
WITH SPECT
TECHNIQUE: Following intravenous administration of radiopharmaceutical,
anterior planar images of the chest were obtained. Regions of
interest were placed on the heart and contralateral chest wall for
quantitative assessment. Additional SPECT imaging of the chest was
obtained.
RADIOPHARMACEUTICALS:  8 mCi TECHNETIUM 99 PYROPHOSPHATE

[Series 1: spect - (id)_(id)_tra · 4.1mm · 4.14mm/px · 6 of 128 frames shown]
[frame 11/128]
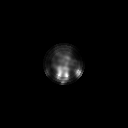
[frame 32/128]
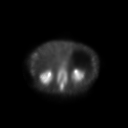
[frame 54/128]
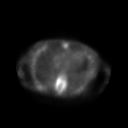
[frame 75/128]
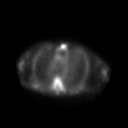
[frame 96/128]
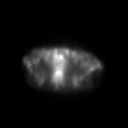
[frame 118/128]
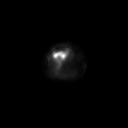

[Series 1: ant-post · 4.14mm/px · 1 of 1 slices shown]
[im 1/1]
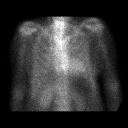

[Series 1: spect - (id)_(id)_cor · 4.1mm · 4.14mm/px · 6 of 128 frames shown]
[frame 11/128]
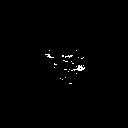
[frame 32/128]
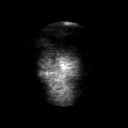
[frame 54/128]
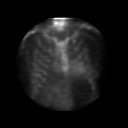
[frame 75/128]
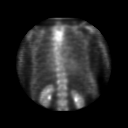
[frame 96/128]
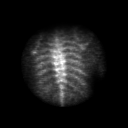
[frame 118/128]
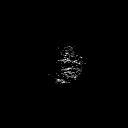

[Series 2: amyloid · 4.14mm/px · 6 of 64 frames shown]
[frame 6/64]
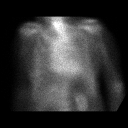
[frame 16/64]
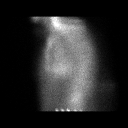
[frame 27/64]
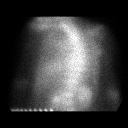
[frame 38/64]
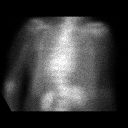
[frame 48/64]
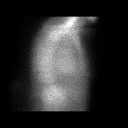
[frame 59/64]
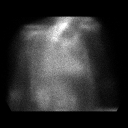

[19 of 19 positions shown; findings below may reference images not displayed]

FINDINGS: Planar Visual assessment:

Anterior planar imaging demonstrates radiotracer uptake within the
heart less than or equal to uptake within the adjacent ribs (Grade
1).

Quantitative assessment :

Quantitative assessment of the cardiac uptake compared to the
contralateral chest wall is equal to 1.47 (H/CL = 1.47 k).

SPECT assessment: SPECT imaging of the chest demonstrates mild to
moderate radiotracer accumulation within the LEFT ventricle.
IMPRESSION: Visual and quantitative assessment (grade 1, H/CLL equal 1.47) are
equivocal for of transthyretin amyloidosis.

## 2021-10-09 MED ORDER — ALPRAZOLAM 0.25 MG PO TABS
0.2500 mg | ORAL_TABLET | Freq: Two times a day (BID) | ORAL | 0 refills | Status: DC | PRN
Start: 2021-10-09 — End: 2021-12-18
  Filled 2021-10-09: qty 20, 10d supply, fill #0

## 2021-10-09 MED ORDER — FUROSEMIDE 10 MG/ML IJ SOLN
20.0000 mg | Freq: Once | INTRAMUSCULAR | Status: AC
  Administered 2021-10-09: 20 mg via INTRAVENOUS
  Filled 2021-10-09: qty 2

## 2021-10-09 MED ORDER — DAPAGLIFLOZIN PROPANEDIOL 10 MG PO TABS
10.0000 mg | ORAL_TABLET | Freq: Every day | ORAL | 5 refills | Status: DC
  Filled 2021-10-09: qty 30, 30d supply, fill #0

## 2021-10-09 MED ORDER — CARVEDILOL 3.125 MG PO TABS
3.1250 mg | ORAL_TABLET | Freq: Two times a day (BID) | ORAL | 5 refills | Status: DC
  Filled 2021-10-09: qty 60, 30d supply, fill #0

## 2021-10-09 MED ORDER — ASPIRIN 81 MG PO CHEW
81.0000 mg | CHEWABLE_TABLET | Freq: Every day | ORAL | 5 refills | Status: DC
  Filled 2021-10-09: qty 30, 30d supply, fill #0

## 2021-10-09 MED ORDER — ATORVASTATIN CALCIUM 40 MG PO TABS
40.0000 mg | ORAL_TABLET | Freq: Every day | ORAL | 5 refills | Status: DC
  Filled 2021-10-09: qty 30, 30d supply, fill #0

## 2021-10-09 MED ORDER — LOSARTAN POTASSIUM 25 MG PO TABS
12.5000 mg | ORAL_TABLET | Freq: Two times a day (BID) | ORAL | 5 refills | Status: DC
Start: 2021-10-09 — End: 2021-10-23
  Filled 2021-10-09: qty 30, 30d supply, fill #0

## 2021-10-09 MED ORDER — CLOPIDOGREL BISULFATE 75 MG PO TABS
75.0000 mg | ORAL_TABLET | Freq: Every day | ORAL | 5 refills | Status: DC
  Filled 2021-10-09: qty 30, 30d supply, fill #0

## 2021-10-09 MED ORDER — LEVOTHYROXINE SODIUM 150 MCG PO TABS
150.0000 ug | ORAL_TABLET | Freq: Every day | ORAL | 3 refills | Status: DC
  Filled 2021-10-09: qty 30, 30d supply, fill #0

## 2021-10-09 MED ORDER — TECHNETIUM TC 99M PYROPHOSPHATE
20.9000 | Freq: Once | INTRAVENOUS | Status: AC | PRN
  Administered 2021-10-09: 20.9 via INTRAVENOUS
  Filled 2021-10-09: qty 21

## 2021-10-09 MED ORDER — SPIRONOLACTONE 25 MG PO TABS
25.0000 mg | ORAL_TABLET | Freq: Every day | ORAL | 5 refills | Status: DC
  Filled 2021-10-09: qty 30, 30d supply, fill #0

## 2021-10-09 NOTE — TOC CM/SW Note (Signed)
. ° °  °  Durable Medical Equipment  (From admission, onward)           Start     Ordered   10/09/21 0951  For home use only DME Cane  Once        10/09/21 0950   10/09/21 0950  For home use only DME Hospital bed  Once       Question Answer Comment  Length of Need Lifetime   Patient has (list medical condition): CVA, Heart Failure   The above medical condition requires: Patient requires the ability to reposition frequently   Head must be elevated greater than: 45 degrees   Bed type Semi-electric   Trapeze Bar Yes   Support Surface: Gel Overlay      10/09/21 0950   10/09/21 0949  For home use only DME Walker rolling  Once       Question Answer Comment  Walker: With [place]   Patient needs a walker to treat with the following condition Heart failure ([place])   Patient needs a walker to treat with the following condition CVA (cerebral vascular accident) ([place])      10/09/21 8550

## 2021-10-09 NOTE — Progress Notes (Addendum)
Advanced Heart Failure Rounding Note  PCP-Cardiologist: Kardie Tobb, DO   Subjective:    R/LHC 2/7 showed normal coronaries and normal filling pressures.   CODE Stroke 2/8 during cMRI. CT head negative. MRI brain showed Punctate acute infarct in the high right frontal cortex with Poor visualization of the right M1 MCA flow void. CTA head/neck NO LVO. NSR on tele. No afib detection this admit.   Neuro deficits resolved.   cMRI: LVEF 12%, RVEF 40%. No myocardial LGE. Elevated T1 indices, ECV was 42% in the basal septum. Consider cardiac amyloidosis though cardiac morphology does not support this. PYP Scan ordered.   Good diuresis yesterday w/ IV Lasix, 3.1L in UOP but still mildly SOB. + cough, green sputum. AF.   Still mildly anxious but slept better last night.   LHC/RHC:  Coronary Findings  Diagnostic Dominance: Right Left Main  Vessel was injected. Vessel is normal in caliber. Vessel is angiographically normal.    Left Anterior Descending  Vessel was injected. Vessel is normal in caliber. Vessel is angiographically normal.    Left Circumflex  Vessel was injected. Vessel is normal in caliber. Vessel is angiographically normal.    Right Coronary Artery  Vessel was injected. Vessel is normal in caliber. Vessel is angiographically normal.    Intervention   No interventions have been documented.   Right Heart  Right Heart Pressures RHC Procedural Findings: Hemodynamics (mmHg) RA mean 2 RV 22/1 PA 20/12, mean 13 PCWP mean 7 LV 125/26 AO 124/73  Oxygen saturations: PA 63% AO 97%  Cardiac Output (Fick) 3.42  Cardiac Index (Fick) 2.23  Cardiac Output (Thermo) 4.29 Cardiac Index (Thermo) 2.79     Objective:   Weight Range: 55.5 kg Body mass index is 23.63 kg/m.   Vital Signs:   Temp:  [97.7 F (36.5 C)-98.1 F (36.7 C)] 97.9 F (36.6 C) (02/10 0734) Pulse Rate:  [77-85] 82 (02/10 0734) Resp:  [17-20] 20 (02/10 0734) BP: (94-122)/(52-90) 118/72  (02/10 0734) SpO2:  [94 %-98 %] 98 % (02/10 0734) Weight:  [55.5 kg] 55.5 kg (02/10 0536) Last BM Date: 10/08/21  Weight change: Filed Weights   10/07/21 0521 10/08/21 1129 10/09/21 0536  Weight: 54.5 kg 55.5 kg 55.5 kg    Intake/Output:   Intake/Output Summary (Last 24 hours) at 10/09/2021 0834 Last data filed at 10/09/2021 0735 Gross per 24 hour  Intake 960 ml  Output 3100 ml  Net -2140 ml      Physical Exam   General:  Well appearing. No respiratory difficulty HEENT: normal Neck: supple. no JVD. Carotids 2+ bilat; no bruits. No lymphadenopathy or thyromegaly appreciated. Cor: PMI nondisplaced. Regular rate & rhythm. No rubs, gallops or murmurs. Lungs: clear Abdomen: soft, nontender, nondistended. No hepatosplenomegaly. No bruits or masses. Good bowel sounds. Extremities: no cyanosis, clubbing, rash, edema Neuro: alert & oriented x 3, cranial nerves grossly intact. moves all 4 extremities w/o difficulty. Affect pleasant.   Telemetry   SR 70s-80s. No Afib detection   Labs    CBC Recent Labs    10/08/21 0408 10/09/21 0225  WBC 7.2 6.2  HGB 13.0 12.3  HCT 38.8 36.6  MCV 93.7 92.2  PLT 173 517   Basic Metabolic Panel Recent Labs    10/08/21 0408 10/09/21 0225  NA 138 138  K 4.4 3.9  CL 107 105  CO2 22 25  GLUCOSE 98 97  BUN 14 20  CREATININE 0.79 0.63  CALCIUM 8.9 9.0   Liver Function  Tests No results for input(s): AST, ALT, ALKPHOS, BILITOT, PROT, ALBUMIN in the last 72 hours.  No results for input(s): LIPASE, AMYLASE in the last 72 hours. Cardiac Enzymes No results for input(s): CKTOTAL, CKMB, CKMBINDEX, TROPONINI in the last 72 hours.  BNP: BNP (last 3 results) Recent Labs    08/25/21 1516 10/05/21 1551  BNP 442.8* 635.9*    ProBNP (last 3 results) No results for input(s): PROBNP in the last 8760 hours.   D-Dimer No results for input(s): DDIMER in the last 72 hours. Hemoglobin A1C No results for input(s): HGBA1C in the last 72  hours.  Fasting Lipid Panel No results for input(s): CHOL, HDL, LDLCALC, TRIG, CHOLHDL, LDLDIRECT in the last 72 hours.  Thyroid Function Tests Recent Labs    10/07/21 0812  T3FREE 2.9    Other results:   Imaging    No results found.   Medications:     Scheduled Medications:  aspirin  81 mg Oral Daily   atorvastatin  40 mg Oral Daily   carvedilol  3.125 mg Oral BID WC   clopidogrel  75 mg Oral Daily   dapagliflozin propanediol  10 mg Oral Daily   enoxaparin (LOVENOX) injection  40 mg Subcutaneous Q24H   gabapentin  300 mg Oral QHS   levothyroxine  150 mcg Oral Q0600   losartan  12.5 mg Oral BID   sertraline  25 mg Oral Daily   sodium chloride flush  3 mL Intravenous Q12H   sodium chloride flush  3 mL Intravenous Q12H   sodium chloride flush  3 mL Intravenous Q12H   spironolactone  25 mg Oral Daily    Infusions:  sodium chloride     sodium chloride      PRN Medications: sodium chloride, sodium chloride, acetaminophen, ALPRAZolam, magnesium hydroxide, ondansetron (ZOFRAN) IV, sodium chloride flush, sodium chloride flush    Patient Profile   Stacey Hartman is a 66 year old with a history of hypothyroidism, former tobacco abuse (quit 20 years ago), COPD, and last week had ECHO with severely reduced EF.    Presenting with progressive chest pain and increased shortness of breath.   Assessment/Plan   1. Acute systolic CHF: Nonischemic cardiomyopathy. Etiology not certain. No CAD on LHC. Has LBBB of uncertain duration.  She has a strong family history of CHF (father died at 75, brother with "CHF," nephew with "CHF" recently died as well). 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   - Echo done last week showed EF < 20% with diffuse hypokinesis and normal RV.  No LV thrombus noted.  - R/LHC 02/06: No significant coronary disease, normal RA pressure and PCWP, LVEDP 28 mmHg, preserved CO - Volume okay today on exam, but still mildly SOB. Check ReDs  - Continue losartan 12.5 mg  daily  - Continue Coreg 3.125 mg BID - Continue Spiro 25 mg daily - Continue Farxiga 10 mg daily  - cRMI No myocardial LGE. Elevated T1 indices, ECV was 42% in the basal septum. Consider cardiac amyloidosis though cardiac morphology does not support this.  - Plan PYP Scan today  2. CVA: Code Stroke 2/8, left sided numbness + left facial droop.  - Head CT negative - Brain MRI + punctate acute infarct in the high right frontal cortex - CTA Head and Neck no LVO  - Suspect infarct due to probable cardio embolic and small vessel disease - No Afib detection on tele this admit - Plan 30 day outpatient monitor for surveillance  - DAPT w/ ASA +  Plavix x 3 weeks then monotherapy per neuro - Statin added, atrorva 40 daily (LDL 89 mg/dL)   3. Mitral valve regurgitation: - Mild to moderate on recent echo - Mild on limited echo this admit   4. Chest pain: No CAD on LHC. - HS troponin negative X 2 - CTA negative for PE   5. Hypothyroidism: - TSH elevated 18. Free T4 normal. Free T3 normal.  Reports noncompliance w/ levothyroxine at home.  -  levothyroxine 150 mcg restarted -  needs f/u w/ PCP   6. COPD/prior tobacco use:  Says she quit smoking 20 years ago.  - Chest CT + for emphysema + 4 mm RUL Pulmonary nodule - Need f/u scan in 12 months - Needs outpatient pulmonology follow-up and PFTs  7. 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 Continue ? blocker - on statin, atorva 40 mg   8. Anxiety - Says that she cannot take Zoloft.  - Low dose Xanax has helped.  - will need f/u w/ PCP  Length of Stay: [address] PA-C  10/09/2021, 8:34 AM  Advanced Heart Failure Team Pager [phone] (M-F; 7a - 5p)  Please contact [facility] for night-coverage after hours (5p -7a ) and weekends on [email or web address]   Patient seen with PA, agree with the above note.   No complaints today, REDS clip 36%.  SBP 90s-100s.    CMRI as above.    General: NAD Neck: JVP 8 cm, no thyromegaly or thyroid nodule.  Lungs: Clear to auscultation bilaterally with normal respiratory effort. CV: Nondisplaced PMI.  Heart regular S1/S2, no S3/S4, no murmur.  No peripheral edema.  Abdomen: Soft, nontender, no hepatosplenomegaly, no distention.  Skin: Intact without lesions or rashes.  Neurologic: Alert and oriented x 3.  Psych: Normal affect. Extremities: No clubbing or cyanosis.  HEENT: Normal.   Minimal volume overload, will give 1 dose of Lasix 20 mg IV today.  Will keep other cardiac meds as is today with relatively soft BP.  She feels good today, denies dyspnea.  Severe LV dysfunction, will need close followup.  She would be a candidate for LVAD in future if she deteriorates.  As above, will arrange PYP scan.  ECV percentage high, though no LGE and otherwise study does not look like cardiac amyloidosis.    She will need a 30 day monitor for AF.   She can go home after PYP scan.  Close followup 1 week in HF clinic.  Meds for home: spironolactone 25 daily, Coreg 3.125 mg bid, losartan 12.5 mg bid, dapagliflozin 10 mg daily, ASA 81, atorvastatin 40, Plavix 75.  She can have Xanax 0.25 mg bid prn anxiety 20 tabs, needs to followup with PCP for further management.   Loralie Champagne 10/09/2021 10:50 AM

## 2021-10-09 NOTE — TOC CM/SW Note (Addendum)
310 pm Uber ride called for pt. Jonnie Finner RN3 CCM, Heart Failure TOC CM [phone]   HH RN/PT arranged with [facility]. They will call pt to set up appts. Explained to pt that she could not have both [employer] and outpt PT. Pt agreeable to [facility]. Explained [place] will deliver RW to room and hospital bed/cane to her home. [place] CCM, Heart Failure TOC CM [phone]   HF TOC CM spoke to pt and states he will go to outpt PT, requesting rolling walker and hospital bed. [place], Heart Failure TOC CM [phone]

## 2021-10-09 NOTE — Care Management Important Message (Signed)
Important Message  Patient Details  Name: Stacey Hartman MRN: [id number] Date of Birth: 06/11/1956   Medicare Important Message Given:  Yes     Shelda Altes 10/09/2021, 10:53 AM

## 2021-10-09 NOTE — Progress Notes (Signed)
Mobility Specialist Progress Note    10/09/21 1240  Mobility  Activity Ambulated with assistance in hallway  Level of Assistance Contact guard assist, steadying assist  Assistive Device Front wheel walker  Distance Ambulated (ft) 285 ft  Activity Response Tolerated fair  $Mobility charge 1 Mobility   Joined pt in hall. No complaints but nervous about procedure. Pt a little SOB with exertion. Returned to bed with call bell in reach.   [facility] Mobility Specialist  M.S. 5N: [phone]

## 2021-10-09 NOTE — Progress Notes (Signed)
Physical Therapy Treatment Patient Details Name: Stacey Hartman MRN: [id number] DOB: [birth year] Today's Date: 10/09/2021   History of Present Illness Patient is a 66 yo female admitted to the ED on 10/05/21 due to chest pain and shortness of breath. ECHO on 10/01/21 shows EF <20%. Code stroke called on 10/07/21 as pt devloped acute L-sided facila numbness while recieving MRI. Neuro consulted. 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 No edema or mass effect. Udergoing continued imaging and work up. PMH includes: CAD, LBBB, CHF, COPD, former tobacco use, COPD, and hypothyroidism.    PT Comments    Pt pleasant, talkative and eager to return home. Pt lives with boyfriend and dog and states she is usually very active. Pt with tremor which she also reports is new since admission with more pronounced during gait challenges. Pt able to perform open hallway gait without physical assist with decreased speed but continues to demonstrate balance deficits and RW for home to allow improved function with daily activities recommended. Pt states she wants to go out and does not desire HHPT.      Recommendations for follow up therapy are one component of a multi-disciplinary discharge planning process, led by the attending physician.  Recommendations may be updated based on patient status, additional functional criteria and insurance authorization.  Follow Up Recommendations  Outpatient PT     Assistance Recommended at Discharge Intermittent Supervision/Assistance  Patient can return home with the following Assistance with cooking/housework;Assist for transportation;Help with stairs or ramp for entrance   Equipment Recommendations  Rolling walker (2 wheels)    Recommendations for Other Services       Precautions / Restrictions Precautions Precautions: Fall Restrictions Weight Bearing Restrictions: No     Mobility  Bed Mobility Overal bed mobility: Modified Independent                   Transfers Overall transfer level: Modified independent                      Ambulation/Gait Ambulation/Gait assistance: Min guard Gait Distance (Feet): 550 Feet Assistive device: None Gait Pattern/deviations: Step-through pattern, Drifts right/left   Gait velocity interpretation: <1.8 ft/sec, indicate of risk for recurrent falls   General Gait Details: pt with decreased stride and speed to maintain balance. Pt maintains bil Hands gripped into gown with noted tremor and no available arm swing. Pt with slowed gait and LOB with all attempts at head turns with gait as well as with increased speed. With slowed gait pt fairly well controlled with guarding for safety but cannot tolerate challenge without LOB   Stairs   Stairs assistance: Supervision Stair Management: Forwards, One rail Left, Alternating pattern Number of Stairs: 4 General stair comments: pt able to complete stairs safely with reliance on rail   Wheelchair Mobility    Modified Rankin (Stroke Patients Only) Modified Rankin (Stroke Patients Only) Pre-Morbid Rankin Score: Slight disability Modified Rankin: Moderate disability     Balance Overall balance assessment: Mild deficits observed, not formally tested                                          Cognition Arousal/Alertness: Awake/alert Behavior During Therapy: WFL for tasks assessed/performed Overall Cognitive Status: Within Functional Limits for tasks assessed  Exercises      General Comments        Pertinent Vitals/Pain Pain Assessment Pain Assessment: No/denies pain    Home Living Family/patient expects to be discharged to:: Private residence Living Arrangements: Spouse/significant other                      Prior Function            PT Goals (current goals can now be found in the care plan section) Progress towards PT goals:  Progressing toward goals    Frequency    Min 3X/week      PT Plan Current plan remains appropriate    Co-evaluation              AM-PAC PT "6 Clicks" Mobility   Outcome Measure  Help needed turning from your back to your side while in a flat bed without using bedrails?: None Help needed moving from lying on your back to sitting on the side of a flat bed without using bedrails?: None Help needed moving to and from a bed to a chair (including a wheelchair)?: None Help needed standing up from a chair using your arms (e.g., wheelchair or bedside chair)?: A Little Help needed to walk in hospital room?: A Little Help needed climbing 3-5 steps with a railing? : A Little 6 Click Score: 21    End of Session   Activity Tolerance: Patient tolerated treatment well Patient left: in bed;with call bell/phone within reach Nurse Communication: Mobility status PT Visit Diagnosis: Unsteadiness on feet (R26.81);Other abnormalities of gait and mobility (R26.89)     Time: 5009-3818 PT Time Calculation (min) (ACUTE ONLY): 24 min  Charges:  $Gait Training: 8-22 mins $Therapeutic Activity: 8-22 mins                     Randa Riss P, PT Acute Rehabilitation Services Pager: [phone] Office: [place] 10/09/2021, 11:02 AM

## 2021-10-09 NOTE — Discharge Summary (Incomplete)
Advanced Heart Failure Team  Discharge Summary   Patient ID: Stacey Hartman MRN: [id number], DOB/AGE: [birth year] 66 y.o. Admit date: 10/05/2021 D/C date:     10/09/2021   Primary Discharge Diagnoses:  ***  Secondary Discharge Diagnoses:  [facility] Course: ***   Discharge Weight Range: *** Discharge Vitals: Blood pressure 102/64, pulse 80, temperature 97.8 F (36.6 C), temperature source Oral, resp. rate 20, height 5' 0.35" (1.533 m), weight 55.5 kg, SpO2 97 %.  Labs: Lab Results  Component Value Date   WBC 6.2 10/09/2021   HGB 12.3 10/09/2021   HCT 36.6 10/09/2021   MCV 92.2 10/09/2021   PLT 160 10/09/2021    Recent Labs  Lab 10/05/21 1551 10/05/21 1717 10/09/21 0225  NA 140   < > 138  K 3.7   < > 3.9  CL 105   < > 105  CO2 24   < > 25  BUN 8   < > 20  CREATININE 0.74   < > 0.63  CALCIUM 9.2   < > 9.0  PROT 7.1  --   --   BILITOT 0.4  --   --   ALKPHOS 75  --   --   ALT 18  --   --   AST 27  --   --   GLUCOSE 133*   < > 97   < > = values in this interval not displayed.   Lab Results  Component Value Date   CHOL 158 10/06/2021   HDL 50 10/06/2021   LDLCALC 89 10/06/2021   TRIG 96 10/06/2021   BNP (last 3 results) Recent Labs    08/25/21 1516 10/05/21 1551  BNP 442.8* 635.9*    ProBNP (last 3 results) No results for input(s): PROBNP in the last 8760 hours.   Diagnostic Studies/Procedures   MR CARDIAC MORPHOLOGY W WO CONTRAST  Result Date: 10/08/2021 CLINICAL DATA:  Cardiomyopathy of uncertain etiology EXAM: CARDIAC MRI TECHNIQUE: The patient was scanned on a 1.5 Tesla GE magnet. A dedicated cardiac coil was used. Functional imaging was done using Fiesta sequences. 2,3, and 4 chamber views were done to assess for RWMA's. Modified Simpson's rule using a short axis stack was used to calculate an ejection fraction on a dedicated work Conservation officer, nature. The patient received 8 cc of Gadavist. After 10 minutes inversion recovery sequences were  used to assess for infiltration and scar tissue. FINDINGS: Limited images of the lung fields showed no gross abnormalities. Severely dilated left ventricle with normal wall thickness. Global hypokinesis with EF 12%, no LV thrombus noted. Normal right ventricular size, mildly decreased systolic function (RV EF 47%). Normal left and right atrial sizes. Mild mitral regurgitation. Mild tricuspid regurgitation. Trileaflet aortic valve, no stenosis or regurgitation. On delayed enhancement imaging, there was no myocardial late gadolinium enhancement (LGE). MEASUREMENTS: MEASUREMENTS LVEDV 330 mL LVSV 39 mL LVEF 12% RVEDV 76 RVSV 31 RVEF 40% T1 1153, ECV 42% in the basal septum IMPRESSION: 1.  Severely dilated LV with global severe hypokinesis, EF 12%. 2.  Normal RV size with mildly decreased systolic function, EF 42%. 3.  No myocardial LGE. 4. Elevated T1 indices, ECV was 42% in the basal septum. Consider cardiac amyloidosis though cardiac morphology does not support this. Dalton Mclean Electronically Signed   By: Loralie Champagne M.D.   On: 10/08/2021 14:08    Discharge Medications   Allergies as of 10/09/2021       Reactions   Penicillins  Trouble breathing      Med Rec must be completed prior to using this [facility]***        Durable Medical Equipment  (From admission, onward)           Start     Ordered   10/09/21 0951  For home use only DME Cane  Once        10/09/21 0950   10/09/21 0950  For home use only DME Hospital bed  Once       Question Answer Comment  Length of Need Lifetime   Patient has (list medical condition): CVA, Heart Failure   The above medical condition requires: Patient requires the ability to reposition frequently   Head must be elevated greater than: 45 degrees   Bed type Semi-electric   Trapeze Bar Yes   Support Surface: Gel Overlay      10/09/21 0950   10/09/21 0949  For home use only DME Walker rolling  Once       Question Answer Comment  Walker: With 5  Inch Wheels   Patient needs a walker to treat with the following condition Heart failure ([place])   Patient needs a walker to treat with the following condition CVA (cerebral vascular accident) ([place])      10/09/21 0949            Disposition   The patient will be discharged in stable condition to home.   Follow-up Information     [facility]. Schedule an appointment as soon as possible for a visit in 1 month(s).   Specialty: Neurology Why: stroke clinic Contact information: [place] Follow up.   Specialty: Cardiology Why: 2/16 at 12:00 PM at the [facility] at [facility], Entrance C Contact information: [address] [id number] [place] [phone]                  Duration of Discharge Encounter: Greater than 35 minutes   Signed, Lyda Jester  10/09/2021, 12:30 PM

## 2021-10-09 NOTE — Discharge Summary (Addendum)
Advanced Heart Failure Team  Discharge Summary   Patient ID: Stacey Hartman MRN: [id number], DOB/AGE: October 24, 1955 66 y.o. Admit date: 10/05/2021 D/C date:     10/09/2021   Primary Discharge Diagnoses:  Acute systolic CHF CVA Mitral valve regurgitation Chest pain Hypothyroidism COPD Thoracic aortic aneurysm Anxiety   Hospital Course:   Stacey Hartman is a 66 year old with a history of hypothyroidism, former tobacco abuse (quit 20 years ago), COPD, and recent ECHO 10/01/21 with severely reduced EF.    Saw PCP for increased shortness of breath. PCP sent her for ECHO and to Pulmonary. Saw Dr [name]. BNP elevated at 442. Echo done with EF < 20% RV normal. Saw Dr Harriet Masson 02/06 and due to increased shortness of breath sent to ED. Endorsed progressive dyspnea and CP on presentation and went for urgent R/LHC. Cardiac cath with no significant CAD, normal PA and PCWP, LVEDP 28 mmHg, CO preserved.  cMRI with LVEF 12%. No myocardial LGE. Elevated T1 indices, ECV 42% in basal septum. PYP scan obtained and pending at time of discharge. Diuresed with IV lasix and started on GDMT.   Developed acute left sided facial numbness during cMRI on 02/08. Code stroke called. MRI brain with punctate acute infarct in high right frontal cortex. No AF detected during admit. During course restarted on levothyroxine (had not been taking) as well as prn Xanax for anxiety.  New medications sent to [facility].  Has f/u in HF clinic 10/15/21  Arranged for [facility] RN PT/OT at discharge.    Hospital Course by Problem: 1. Acute systolic CHF: Nonischemic cardiomyopathy. Etiology not certain. No CAD on LHC. Has LBBB of uncertain duration.  She has a strong family history of CHF (father died at 56, brother with "CHF," nephew with "CHF" recently died as well). 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   - Echo done last week showed EF < 20% with diffuse hypokinesis and normal RV.  No LV thrombus noted.  - R/LHC 02/06: No significant coronary  disease, normal RA pressure and PCWP, LVEDP 28 mmHg, preserved CO - Diuresed with IV lasix during admit. ReDS 36% today. Given 20 mg lasix IV prior to discharge. Discharge weight 122 lb. - Continue losartan 12.5 mg BID  - Continue Coreg 3.125 mg BID - Continue Spiro 25 mg daily - Continue Farxiga 10 mg daily  - cRMI No myocardial LGE. Elevated T1 indices, ECV was 42% in the basal septum. Consider cardiac amyloidosis though cardiac morphology does not support this.  - PYP done today. Pending.   2. CVA: Code Stroke 2/8, left sided numbness + left facial droop.  - Head CT negative - Brain MRI + punctate acute infarct in the high right frontal cortex - CTA Head and Neck no LVO  - Suspect infarct due to probable cardio embolic and small vessel disease - No Afib detection on tele this admit - Plan 30 day outpatient monitor for surveillance. Ordered and will be mailed to patient. - DAPT w/ ASA + Plavix x 3 weeks then monotherapy per neuro - Statin added, atrorva 40 daily (LDL 89 mg/dL)    3. Mitral valve regurgitation: - Mild to moderate on recent echo - Mild on limited echo this admit    4. Chest pain:  - No CAD on LHC. - HS troponin negative X 2 - CTA negative for PE    5. Hypothyroidism: - TSH elevated 18. Free T4 normal. Free T3 normal.  Reports noncompliance w/ levothyroxine at home.  -  levothyroxine 150  mcg restarted -  needs f/u w/ PCP    6. COPD/prior tobacco use:  Says she quit smoking 20 years ago.  - Chest CT + for emphysema + 4 mm RUL Pulmonary nodule - Need f/u scan in 12 months - Needs outpatient pulmonology follow-up and PFTs   7. 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 Continue ? blocker   8. Anxiety - Says that she cannot take Zoloft.  - Low dose Xanax has helped.  - Given Prescription for Xanax 0.25 mg BID prn, 20 tabs. Will need f/u with PCP for management.     Discharge Vitals: Blood pressure  102/64, pulse 80, temperature 97.8 F (36.6 C), temperature source Oral, resp. rate 20, height 5' 0.35" (1.533 m), weight 55.5 kg, SpO2 97 %.  Labs: Lab Results  Component Value Date   WBC 6.2 10/09/2021   HGB 12.3 10/09/2021   HCT 36.6 10/09/2021   MCV 92.2 10/09/2021   PLT 160 10/09/2021    Recent Labs  Lab 10/05/21 1551 10/05/21 1717 10/09/21 0225  NA 140   < > 138  K 3.7   < > 3.9  CL 105   < > 105  CO2 24   < > 25  BUN 8   < > 20  CREATININE 0.74   < > 0.63  CALCIUM 9.2   < > 9.0  PROT 7.1  --   --   BILITOT 0.4  --   --   ALKPHOS 75  --   --   ALT 18  --   --   AST 27  --   --   GLUCOSE 133*   < > 97   < > = values in this interval not displayed.   Lab Results  Component Value Date   CHOL 158 10/06/2021   HDL 50 10/06/2021   LDLCALC 89 10/06/2021   TRIG 96 10/06/2021   BNP (last 3 results) Recent Labs    08/25/21 1516 10/05/21 1551  BNP 442.8* 635.9*    ProBNP (last 3 results) No results for input(s): PROBNP in the last 8760 hours.   Diagnostic Studies/Procedures   No results found.  Discharge Medications   Allergies as of 10/09/2021       Reactions   Penicillins    Trouble breathing         Medication List     STOP taking these medications    esomeprazole 20 MG capsule Commonly known as: [place] these medications    ALPRAZolam 0.25 MG tablet Commonly known as: XANAX Take 1 tablet (0.25 mg total) by mouth 2 (two) times daily as needed for anxiety.   Aspirin Low Dose 81 MG chewable tablet Generic drug: aspirin Chew 1 tablet (81 mg total) by mouth daily. Start taking on: October 10, 2021   atorvastatin 40 MG tablet Commonly known as: LIPITOR Take 1 tablet (40 mg total) by mouth daily. Start taking on: October 10, 2021   carvedilol 3.125 MG tablet Commonly known as: COREG Take 1 tablet (3.125 mg total) by mouth 2 (two) times daily with a meal.   clopidogrel 75 MG tablet Commonly known as: PLAVIX Take 1  tablet (75 mg total) by mouth daily. Start taking on: October 10, 2021   Farxiga 10 MG Tabs tablet Generic drug: dapagliflozin propanediol Take 1 tablet (10 mg total) by mouth daily. Start taking on:  October 10, 2021   gabapentin 300 MG capsule Commonly known as: NEURONTIN Take 300 mg by mouth at bedtime.   levothyroxine 150 MCG tablet Commonly known as: SYNTHROID Take 1 tablet (150 mcg total) by mouth daily before breakfast.   losartan 25 MG tablet Commonly known as: COZAAR Take 1/2 tablet (12.5 mg total) by mouth 2 (two) times daily.   spironolactone 25 MG tablet Commonly known as: ALDACTONE Take 1 tablet (25 mg total) by mouth daily. Start taking on: October 10, 2021               Durable Medical Equipment  (From admission, onward)           Start     Ordered   10/09/21 7035  For home use only DME Cane  Once        10/09/21 0950   10/09/21 0950  For home use only DME Hospital bed  Once       Question Answer Comment  Length of Need Lifetime   Patient has (list medical condition): CVA, Heart Failure   The above medical condition requires: Patient requires the ability to reposition frequently   Head must be elevated greater than: 45 degrees   Bed type Semi-electric   Trapeze Bar Yes   Support Surface: Gel Overlay      10/09/21 0950   10/09/21 0949  For home use only DME Walker rolling  Once       Question Answer Comment  Walker: With 5 Inch Wheels   Patient needs a walker to treat with the following condition Heart failure ([place])   Patient needs a walker to treat with the following condition CVA (cerebral vascular accident) ([place])      10/09/21 0949            Disposition   The patient will be discharged in stable condition to home.   Follow-up Information     [facility]. Schedule an appointment as soon as possible for a visit in 1 month(s).   Specialty: Neurology Why: stroke clinic Contact information: [facility] [place] Follow up.   Specialty: Cardiology Why: 2/16 at 12:00 PM at the [facility] at [facility], Entrance C Contact information: [address] [id number] [place], [facility] Follow up.   Specialty: Home Health Services Why: Home Health RN and Physical Therapy-agency will call to arrange appt Contact information: [place] [address] [phone]         Vidal Schwalbe, MD Follow up in 1 week(s).   Specialty: Family Medicine Contact information: [address] [place] [address] [phone]                   Duration of Discharge Encounter: Greater than 35 minutes   Signed, [facility], LINDSAY N  10/09/2021, 2:27 PM   Patient seen with PA, agree with the above note.   Stable for discharge today.  Medications for home as above.  Close outpatient followup.   Loralie Champagne 10/09/2021

## 2021-10-13 ENCOUNTER — Other Ambulatory Visit (HOSPITAL_BASED_OUTPATIENT_CLINIC_OR_DEPARTMENT_OTHER): Payer: Self-pay

## 2021-10-13 ENCOUNTER — Other Ambulatory Visit (HOSPITAL_COMMUNITY): Payer: Self-pay

## 2021-10-14 ENCOUNTER — Ambulatory Visit: Payer: 59 | Admitting: Internal Medicine

## 2021-10-14 NOTE — Progress Notes (Unsigned)
Stacey Hartman, female    DOB: [birth year],    MRN: [id number]   Brief patient profile:  2   yowf quit smoking  around 2002 with cough/sob completely improved to point where no symptoms including walking up to several miles including hills but dx fibromyalgia since 2012  then August 2021 salmonella/rx as out pt with "pcn" then severe skin peeling lost 22 lb and persistent diarrhea/sob/ cough referred to pulmonary clinic in [facility]  08/25/2021 by [name] already under care of skin doctor in [place], GI doctor in [place], rheumatology eval pending.      History of Present Illness  08/25/2021  Pulmonary/ 1st office eval/ Katina Remick / [place] Office  Chief Complaint  Patient presents with   Consult    Hx of COPD. After penicillin has noticed struggle with breathing since then (03/2020).   Coughs up green mucus    Dyspnea:  getting worse to point of 100 ft doe Cough: congested cough worse when lies down  Sleep: bed is flat with 3 pillows  SABA use: spiriva and albuterol helped / has not tried pred for this illness but took it previously for "bronchitis" and seemed to help Omeprazole bid per GI   Rec Stiolto one puff a day x one week and if not better start on 2 puffs each am  Omeprazole 20 mg Take 30- 60 min before your first and last meals of the day  GERD diet reviewed, bed blocks rec b  Please remember to go to the  x-ray department    If you do have ulcerative colitis it may be affecting your airways - discuss with your GI doctor  Please schedule a follow up office visit in 6 weeks, call sooner if needed with pfts next available     10/14/2021  f/u ov/[place] office/Ashtyn Freilich re: *** maint on ***  No chief complaint on file.   Dyspnea:  *** Cough: *** Sleeping: *** SABA use: *** 02: *** Covid status: *** Lung cancer screening: ***   No obvious day to day or daytime variability or assoc excess/ purulent sputum or mucus plugs or hemoptysis or cp or chest tightness, subjective  wheeze or overt sinus or hb symptoms.   *** without nocturnal  or early am exacerbation  of respiratory  c/o's or need for noct saba. Also denies any obvious fluctuation of symptoms with weather or environmental changes or other aggravating or alleviating factors except as outlined above   No unusual exposure hx or h/o childhood pna/ asthma or knowledge of premature birth.  Current Allergies, Complete Past Medical History, Past Surgical History, Family History, and Social History were reviewed in [employer] record.  ROS  The following are not active complaints unless bolded Hoarseness, sore throat, dysphagia, dental problems, itching, sneezing,  nasal congestion or discharge of excess mucus or purulent secretions, ear ache,   fever, chills, sweats, unintended wt loss or wt gain, classically pleuritic or exertional cp,  orthopnea pnd or arm/hand swelling  or leg swelling, presyncope, palpitations, abdominal pain, anorexia, nausea, vomiting, diarrhea  or change in bowel habits or change in bladder habits, change in stools or change in urine, dysuria, hematuria,  rash, arthralgias, visual complaints, headache, numbness, weakness or ataxia or problems with walking or coordination,  change in mood or  memory.        No outpatient medications have been marked as taking for the 10/14/21 encounter (Appointment) with [name], MD.  Objective:     10/14/2021        ***   10/09/21 122 lb 6.4 oz (55.5 kg)  10/05/21 125 lb 6.4 oz (56.9 kg)  08/25/21 128 lb (58.1 kg)      Vital signs reviewed  10/14/2021  - Note at rest 02 sats  ***% on ***   General appearance:    ***      Min bar***      CXR PA and Lateral:   08/25/2021 :    I personally reviewed images and agree with radiology impression as follows:    Did not go for cxr as rec     Assessment

## 2021-10-14 NOTE — Progress Notes (Incomplete)
PCP: Primary Cardiologist: Dr Aundra Dubin   HPI:  [name] is a 66 year old with a history of hypothyroidism, former tobacco abuse (quit 20 years ago), COPD, and recent ECHO 10/01/21 with severely reduced EF.    Saw PCP for increased shortness of breath. PCP sent her for ECHO and to Pulmonary. Saw Dr [name]. BNP elevated at 442. Echo done with EF < 20% RV normal. Saw Dr Harriet Masson 02/06 and due to increased shortness of breath sent to ED. Endorsed progressive dyspnea and CP on presentation and went for urgent R/LHC. Cardiac cath with no significant CAD, normal PA and PCWP, LVEDP 28 mmHg, CO preserved.  cMRI with LVEF 12%. No myocardial LGE. Elevated T1 indices, ECV 42% in basal septum. PYP scan obtained and pending at time of discharge. Diuresed with IV lasix and started on GDMT. Developed acute left sided facial numbness during cMRI on 02/08. Code stroke called. MRI brain with punctate acute infarct in high right frontal cortex. No AF detected during admit. Discharge weight 122 pounds.    Today she returns for post hospital follow up. Overall feeling fine. Denies SOB/PND/Orthopnea. Appetite ok. No fever or chills. Weight at home 170 pounds. Taking all medications    Cardiac Studies  10/05/21 Echo < 20 %   09/2021 RHC/LHC  No significant CAD, normal PA and PCWP, LVEDP 28 mmHg, CO preserved.   09/2021 CMRI  LVEF 12%. No myocardial LGE. Elevated T1 indices, ECV 42% in basal septum.   ROS: All systems negative except as listed in HPI, PMH and Problem List.  SH:  Social History   Socioeconomic History   Marital status: Unknown    Spouse name: Not on file   Number of children: Not on file   Years of education: Not on file   Highest education level: Not on file  Occupational History   Not on file  Tobacco Use   Smoking status: Former   Smokeless tobacco: Never  Substance and Sexual Activity   Alcohol use: Not Currently   Drug use: Never   Sexual activity: Not on file  Other Topics Concern    Not on file  Social History Narrative   Not on file   Social Determinants of Health   Financial Resource Strain: Not on file  Food Insecurity: Not on file  Transportation Needs: Not on file  Physical Activity: Not on file  Stress: Not on file  Social Connections: Not on file  Intimate Partner Violence: Not on file    FH:  Family History  Problem Relation Age of Onset   Heart attack Father    Heart failure Brother     Past Medical History:  Diagnosis Date   Hypothyroid     Current Outpatient Medications  Medication Sig Dispense Refill   ALPRAZolam (XANAX) 0.25 MG tablet Take 1 tablet (0.25 mg total) by mouth 2 (two) times daily as needed for anxiety. 20 tablet 0   aspirin 81 MG chewable tablet Chew 1 tablet (81 mg total) by mouth daily. 30 tablet 5   atorvastatin (LIPITOR) 40 MG tablet Take 1 tablet (40 mg total) by mouth daily. 30 tablet 5   carvedilol (COREG) 3.125 MG tablet Take 1 tablet (3.125 mg total) by mouth 2 (two) times daily with a meal. 60 tablet 5   clopidogrel (PLAVIX) 75 MG tablet Take 1 tablet (75 mg total) by mouth daily. 30 tablet 5   dapagliflozin propanediol (FARXIGA) 10 MG TABS tablet Take 1 tablet (10 mg total) by  mouth daily. 30 tablet 5   gabapentin (NEURONTIN) 300 MG capsule Take 300 mg by mouth at bedtime.     levothyroxine (SYNTHROID) 150 MCG tablet Take 1 tablet (150 mcg total) by mouth daily before breakfast. 30 tablet 3   losartan (COZAAR) 25 MG tablet Take 1/2 tablet (12.5 mg total) by mouth 2 (two) times daily. 30 tablet 5   spironolactone (ALDACTONE) 25 MG tablet Take 1 tablet (25 mg total) by mouth daily. 30 tablet 5   No current facility-administered medications for this visit.    There were no vitals filed for this visit.  PHYSICAL EXAM:  General:  Well appearing. No resp difficulty HEENT: normal Neck: supple. JVP flat. Carotids 2+ bilaterally; no bruits. No lymphadenopathy or thryomegaly appreciated. Cor: PMI normal. Regular rate &  rhythm. No rubs, gallops or murmurs. Lungs: clear Abdomen: soft, nontender, nondistended. No hepatosplenomegaly. No bruits or masses. Good bowel sounds. Extremities: no cyanosis, clubbing, rash, edema Neuro: alert & orientedx3, cranial nerves grossly intact. Moves all 4 extremities w/o difficulty. Affect pleasant.   ECG:   ASSESSMENT & PLAN: 1. Chronic HFrEF: NICM .  No CAD on LHC. Has LBBB of uncertain duration.  She has a strong family history of CHF (father died at 56, brother with "CHF," nephew with "CHF" recently died as well). 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   - Echo 09/2021 EF < 20% with diffuse hypokinesis and normal RV.  No LV thrombus noted.  - R/LHC 02/06: No significant coronary disease, normal RA pressure and PCWP, LVEDP 28 mmHg, preserved CO - cMRI  Elevated T1 indices, ECV was 42% in the basal septum  -PYP equivocal. Will send TTR genetic test today .  - NYHA  Reds Clip  - Continue losartan 12.5 mg BID  - Continue Coreg 3.125 mg BID - Continue Spiro 25 mg daily - Continue Farxiga 10 mg daily  -   2. CVA: \ - Head CT negative - Brain MRI + punctate acute infarct in the high right frontal cortex - CTA Head and Neck no LVO  - Suspect infarct due to probable cardio embolic and small vessel disease - No Afib detection on tele this admit - Plan 30 day outpatient monitor for surveillance. Ordered and will be mailed to patient. - DAPT w/ ASA + Plavix x 3 weeks then monotherapy per neuro - Statin added, atrorva 40 daily (LDL 89 mg/dL)    3. Mitral valve regurgitation: - Mild to moderate on recent echo - Mild on limited echo 09/2021      5. Hypothyroidism: - TSH elevated 18. Free T4 normal. Free T3 normal.   -  levothyroxine 150 mcg restarted during recent catherizaton  -  needs f/u w/ PCP    6. COPD/prior tobacco use:  Says she quit smoking 20 years ago.  - Chest CT + for emphysema + 4 mm RUL Pulmonary nodule - Need f/u scan in 12 months - She has f/u with Dr [name]     7. 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 Continue ? blocker

## 2021-10-15 ENCOUNTER — Encounter (HOSPITAL_COMMUNITY): Payer: 59

## 2021-10-16 ENCOUNTER — Ambulatory Visit (INDEPENDENT_AMBULATORY_CARE_PROVIDER_SITE_OTHER): Payer: 59

## 2021-10-16 ENCOUNTER — Telehealth (HOSPITAL_COMMUNITY): Payer: Self-pay

## 2021-10-16 DIAGNOSIS — I69392 Facial weakness following cerebral infarction: Secondary | ICD-10-CM

## 2021-10-16 DIAGNOSIS — I639 Cerebral infarction, unspecified: Secondary | ICD-10-CM

## 2021-10-16 DIAGNOSIS — I4891 Unspecified atrial fibrillation: Secondary | ICD-10-CM | POA: Diagnosis not present

## 2021-10-16 DIAGNOSIS — I447 Left bundle-branch block, unspecified: Secondary | ICD-10-CM

## 2021-10-16 DIAGNOSIS — I428 Other cardiomyopathies: Secondary | ICD-10-CM | POA: Diagnosis not present

## 2021-10-16 NOTE — Telephone Encounter (Signed)
-----   Message from Larey Dresser, MD sent at 10/09/2021  3:56 PM EST ----- Equivocal study.  Will need to send genetic testing for hereditary ATTR amyloidosis when she comes to office.

## 2021-10-19 ENCOUNTER — Ambulatory Visit: Payer: 59 | Admitting: Internal Medicine

## 2021-10-19 NOTE — Progress Notes (Incomplete)
Stacey Hartman, female    DOB: 02-21-1956,    MRN: [id number]   Brief patient profile:  26   yowf quit smoking  around 2002 with cough/sob completely improved to point where no symptoms including walking up to several miles including hills but dx fibromyalgia since 2012  then August 2021 salmonella/rx as out pt with "pcn" then severe skin peeling lost 22 lb and persistent diarrhea/sob/ cough referred to pulmonary clinic in [facility]  08/25/2021 by [name] already under care of skin doctor in [place], GI doctor in [place], rheumatology eval pending.      History of Present Illness  08/25/2021  Pulmonary/ 1st office eval/ [name] / [name] Office  Chief Complaint  Patient presents with   Consult    Hx of COPD. After penicillin has noticed struggle with breathing since then (03/2020).   Coughs up green mucus    Dyspnea:  getting worse to point of 100 ft doe Cough: congested cough worse when lies down  Sleep: bed is flat with 3 pillows  SABA use: spiriva and albuterol helped / has not tried pred for this illness but took it previously for "bronchitis" and seemed to help Omeprazole bid per GI   Rec Stiolto one puff a day x one week and if not better start on 2 puffs each am  Omeprazole 20 mg Take 30- 60 min before your first and last meals of the day  GERD diet reviewed, bed blocks rec b  Please remember to go to the  x-ray department    If you do have ulcerative colitis it may be affecting your airways - discuss with your GI doctor  Please schedule a follow up office visit in 6 weeks, call sooner if needed with pfts next available     10/19/2021  f/u ov/[name] office/[name] re: *** maint on ***  No chief complaint on file.   Dyspnea:  *** Cough: *** Sleeping: *** SABA use: *** 02: *** Covid status: *** Lung cancer screening: ***   No obvious day to day or daytime variability or assoc excess/ purulent sputum or mucus plugs or hemoptysis or cp or chest tightness, subjective  wheeze or overt sinus or hb symptoms.   *** without nocturnal  or early am exacerbation  of respiratory  c/o's or need for noct saba. Also denies any obvious fluctuation of symptoms with weather or environmental changes or other aggravating or alleviating factors except as outlined above   No unusual exposure hx or h/o childhood pna/ asthma or knowledge of premature birth.  Current Allergies, Complete Past Medical History, Past Surgical History, Family History, and Social History were reviewed in [employer] record.  ROS  The following are not active complaints unless bolded Hoarseness, sore throat, dysphagia, dental problems, itching, sneezing,  nasal congestion or discharge of excess mucus or purulent secretions, ear ache,   fever, chills, sweats, unintended wt loss or wt gain, classically pleuritic or exertional cp,  orthopnea pnd or arm/hand swelling  or leg swelling, presyncope, palpitations, abdominal pain, anorexia, nausea, vomiting, diarrhea  or change in bowel habits or change in bladder habits, change in stools or change in urine, dysuria, hematuria,  rash, arthralgias, visual complaints, headache, numbness, weakness or ataxia or problems with walking or coordination,  change in mood or  memory.        No outpatient medications have been marked as taking for the 10/19/21 encounter (Appointment) with [name], MD.  Objective:     10/19/2021        ***   10/09/21 122 lb 6.4 oz (55.5 kg)  10/05/21 125 lb 6.4 oz (56.9 kg)  08/25/21 128 lb (58.1 kg)      Vital signs reviewed  10/19/2021  - Note at rest 02 sats  ***% on ***   General appearance:    ***      Min bar***      CXR PA and Lateral:   08/25/2021 :    I personally reviewed images and agree with radiology impression as follows:    Did not go for cxr as rec     Assessment

## 2021-10-21 ENCOUNTER — Other Ambulatory Visit (HOSPITAL_COMMUNITY): Payer: Self-pay

## 2021-10-21 ENCOUNTER — Telehealth (HOSPITAL_COMMUNITY): Payer: Self-pay

## 2021-10-21 NOTE — Progress Notes (Signed)
ADVANCED HF CLINIC CONSULT NOTE   Primary Care: Vidal Schwalbe, MD HF Cardiologist: Dr. Aundra Dubin  HPI: Stacey Hartman is a 66 y.o. with a history of hypothyroidism, former tobacco abuse (quit 20 years ago), COPD, and new  systolic heart failure with severely reduced EF.    Father and brother died from heart attack.    Saw PCP for increased shortness of breath. Echo 2/23 EF < 20% RV normal.   Sent to ED from cardiology office after echo showed EF < 20%. Had CP and SOB in waiting room/triage and taken for urgent R/LHC. Cardiac cath with no significant CAD, normal PA and PCWP, LVEDP 28 mmHg, CO preserved.  cMRI with LVEF 12%. No myocardial LGE. Elevated T1 indices, ECV 42% in basal septum. Developed acute left sided facial numbness during cMRI on 02/08. Code stroke called. MRI brain with punctate acute infarct in high right frontal cortex. No AF detected during admit. 30-day monitor arranged to evaluate for arrhythmias. Continued on ASA +Plavix x 3 weeks, then monotherapy, per Neuro recs. Diuresed with IV lasix and started on GDMT.  PYP obtained and equivocal for transthyretin amyloidosis. She was discharged home, weight 122 lbs.  Today she returns for post hospital HF follow up. Her daughter in law was killed yesterday in [place], she is struggling emotionally with this. She is SOB walking on flat ground further distances. Has some dizziness and ankle swelling. Denies palpitations, CP, or abnormal bleeding. Appetite ok. No fever or chills. Weight at home 122 pounds. Taking all medications. She sleeps reclined in hospital bed. She is asking for a Xanax refill.  ECG (personally reviewed): NSR LBBB QRS 170 msec  ReDs: 27%  Labs (2/23): K 3.9, creatinine 0.63, 12.3   Review of Systems: [y] = yes, [ ]  = no   General: Weight gain [ ] ; Weight loss [ ] ; Anorexia [ ] ; Fatigue [ ] ; Fever [ ] ; Chills [ ] ; Weakness [ ]   Cardiac: Chest pain/pressure [ ] ; Resting SOB [ ] ; Exertional SOB Blue.Reese ]; Orthopnea [ ] ;  Pedal Edema Blue.Reese ]; Palpitations [ ] ; Syncope [ ] ; Presyncope [ ] ; Paroxysmal nocturnal dyspnea[ ]   Pulmonary: Cough [ ] ; Wheezing[ ] ; Hemoptysis[ ] ; Sputum [ ] ; Snoring [ ]   GI: Vomiting[ ] ; Dysphagia[ ] ; Melena[ ] ; Hematochezia [ ] ; Heartburn[ ] ; Abdominal pain [ ] ; Constipation [ ] ; Diarrhea [ ] ; BRBPR [ ]   GU: Hematuria[ ] ; Dysuria [ ] ; Nocturia[ ]   Vascular: Pain in legs with walking [ ] ; Pain in feet with lying flat [ ] ; Non-healing sores [ ] ; Stroke [ ] ; TIA [ ] ; Slurred speech [ ] ;  Neuro: Headaches[ ] ; Vertigo[ ] ; Seizures[ ] ; Paresthesias[ ] ;Blurred vision [ ] ; Diplopia [ ] ; Vision changes [ ]   Ortho/Skin: Arthritis [ ] ; Joint pain [ ] ; Muscle pain [ ] ; Joint swelling [ ] ; Back Pain [ ] ; Rash [ ]   Psych: Depression[ ] ; Anxiety[ y ]  Heme: Bleeding problems [ ] ; Clotting disorders [ ] ; Anemia [ ]   Endocrine: Diabetes [ ] ; Thyroid dysfunction[ y ]   Past Medical History:  Diagnosis Date   Hypothyroid    Current Outpatient Medications  Medication Sig Dispense Refill   ALPRAZolam (XANAX) 0.25 MG tablet Take 1 tablet (0.25 mg total) by mouth 2 (two) times daily as needed for anxiety. 20 tablet 0   aspirin 81 MG chewable tablet Chew 1 tablet (81 mg total) by mouth daily. 30 tablet 5   atorvastatin (LIPITOR) 40 MG tablet Take 1 tablet (  40 mg total) by mouth daily. 30 tablet 5   carvedilol (COREG) 3.125 MG tablet Take 1 tablet (3.125 mg total) by mouth 2 (two) times daily with a meal. 60 tablet 5   clopidogrel (PLAVIX) 75 MG tablet Take 1 tablet (75 mg total) by mouth daily. 30 tablet 5   dapagliflozin propanediol (FARXIGA) 10 MG TABS tablet Take 1 tablet (10 mg total) by mouth daily. 30 tablet 5   gabapentin (NEURONTIN) 300 MG capsule Take 300 mg by mouth at bedtime.     levothyroxine (SYNTHROID) 150 MCG tablet Take 1 tablet (150 mcg total) by mouth daily before breakfast. 30 tablet 3   losartan (COZAAR) 25 MG tablet Take 1/2 tablet (12.5 mg total) by mouth 2 (two) times daily. 30 tablet 5    spironolactone (ALDACTONE) 25 MG tablet Take 1 tablet (25 mg total) by mouth daily. 30 tablet 5   No current facility-administered medications for this encounter.   Allergies  Allergen Reactions   Penicillins     Trouble breathing    Social History   Socioeconomic History   Marital status: Unknown    Spouse name: Not on file   Number of children: Not on file   Years of education: Not on file   Highest education level: Not on file  Occupational History   Not on file  Tobacco Use   Smoking status: Former   Smokeless tobacco: Never  Substance and Sexual Activity   Alcohol use: Not Currently   Drug use: Never   Sexual activity: Not on file  Other Topics Concern   Not on file  Social History Narrative   Not on file   Social Determinants of Health   Financial Resource Strain: Not on file  Food Insecurity: Not on file  Transportation Needs: Not on file  Physical Activity: Not on file  Stress: Not on file  Social Connections: Not on file  Intimate Partner Violence: Not on file   Family History  Problem Relation Age of Onset   Heart attack Father    Heart failure Brother    BP 90/70    Pulse 80    Wt 56.5 kg (124 lb 9.6 oz)    LMP  (LMP Unknown)    SpO2 97%    BMI 24.05 kg/m   Wt Readings from Last 3 Encounters:  10/23/21 56.5 kg (124 lb 9.6 oz)  10/09/21 55.5 kg (122 lb 6.4 oz)  10/05/21 56.9 kg (125 lb 6.4 oz)   PHYSICAL EXAM: General:  NAD. No resp difficulty HEENT: Normal Neck: Supple. No JVD. Carotids 2+ bilat; no bruits. No lymphadenopathy or thryomegaly appreciated. Cor: PMI nondisplaced. Regular rate & rhythm. No rubs, gallops or murmurs. Lungs: Clear Abdomen: Soft, nontender, nondistended. No hepatosplenomegaly. No bruits or masses. Good bowel sounds. Extremities: No cyanosis, clubbing, rash, edema Neuro: Alert & oriented x 3, cranial nerves grossly intact. Moves all 4 extremities w/o difficulty. Affect pleasant.  ASSESSMENT & PLAN: 1. Chronic  systolic CHF: Nonischemic cardiomyopathy. Etiology not certain. No CAD on LHC. Has LBBB of uncertain duration.  She has a strong family history of CHF (father died at 69, brother with "CHF," nephew with "CHF" recently died as well). 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  Echo 2/23 showed EF < 20% with diffuse hypokinesis and normal RV.  No LV thrombus noted. R/LHC 02/23: No significant coronary disease, normal RA pressure and PCWP, LVEDP 28 mmHg, preserved CO. cMRI (2/23): No myocardial LGE. Elevated T1 indices, ECV was 42% in the basal  septum. PYP not suggestive of TTR amyloidosis. Will arrange for genetic testing. Stable NYHA II today, she is not volume overloaded on exam. No BP room for GDMT titration. - Decrease losartan to 12.5 mg q hs. - Continue Coreg 3.125 mg bid. - Continue Spiro 25 mg daily. - Continue Farxiga 10 mg daily.  - Plan to repeat echo in 3 months after titration of GDMT. - She would be a candidate for LVAD in future if she deteriorates. 2. CVA: Code Stroke during cMRI. Head CT negative, MRI brain + punctate acute infarct in the high right frontal cortex. CTA Head and Neck no LVO. Suspect infarct due to probable cardio embolic and small vessel disease. No Afib detected during admit.  - Currently wearing 30 day monitor for surveillance.  - DAPT w/ ASA + Plavix x 3 weeks then monotherapy per Neuro. - Continue atrorva 40 daily (LDL 89 mg/dL).  - She has not made post-hospital follow up appt, gave # to do so. 3. Mitral valve regurgitation: Mild to moderate on recent echo. - Mild on limited echo this admit.  4. Hypothyroidism: Back on levothyroxine.  - Needs follow up with PCP.  5. COPD/prior tobacco use:  Says she quit smoking 20 years ago. Chest CT + for emphysema + 4 mm RUL Pulmonary nodule. - Need f/u scan in 12 months. - She has follow up with Dr. [name] next month. 6. Thoracic Aneurysm: Noted on chest CT, 4.0 cm, former smoker. - Needs annual f/u.  - BP control. Continue ?  blocker. - Continue atorvastatin 40 mg.  7. Anxiety: Unable to tolerate SSRIs. Given 20 tablets of alprazolam on discharge (10/09/21), further refills need to be addressed with PCP. This was discussed again at visit today. 8. Physical Deconditioning: HH PT with [facility]. Consider CR after finished with PT.   Follow up in 3 weeks with PharmD (increase losartan if able or switch to [facility]), 6 weeks with APP, and 12 weeks with Dr. Aundra Dubin + echo.   Stacey Katz, FNP-BC 10/23/21

## 2021-10-21 NOTE — Telephone Encounter (Signed)
Transitions of Care Pharmacy   Call attempted for a pharmacy transitions of care follow-up. Busy signal.   Call attempt #2. Will follow-up in 2-3 days.

## 2021-10-23 ENCOUNTER — Other Ambulatory Visit: Payer: Self-pay

## 2021-10-23 ENCOUNTER — Encounter (HOSPITAL_COMMUNITY): Payer: Self-pay

## 2021-10-23 ENCOUNTER — Ambulatory Visit (HOSPITAL_COMMUNITY)
Admission: RE | Admit: 2021-10-23 | Discharge: 2021-10-23 | Disposition: A | Discharge status: Home / Self Care | Payer: 59 | Source: Ambulatory Visit | Attending: Family Medicine | Admitting: Family Medicine

## 2021-10-23 VITALS — BP 90/70 | HR 80 | Wt 124.6 lb

## 2021-10-23 DIAGNOSIS — I110 Hypertensive heart disease with heart failure: Secondary | ICD-10-CM | POA: Insufficient documentation

## 2021-10-23 DIAGNOSIS — I447 Left bundle-branch block, unspecified: Secondary | ICD-10-CM | POA: Insufficient documentation

## 2021-10-23 DIAGNOSIS — I712 Thoracic aortic aneurysm, without rupture, unspecified: Secondary | ICD-10-CM

## 2021-10-23 DIAGNOSIS — Z7984 Long term (current) use of oral hypoglycemic drugs: Secondary | ICD-10-CM | POA: Insufficient documentation

## 2021-10-23 DIAGNOSIS — Z7902 Long term (current) use of antithrombotics/antiplatelets: Secondary | ICD-10-CM | POA: Diagnosis not present

## 2021-10-23 DIAGNOSIS — Z634 Disappearance and death of family member: Secondary | ICD-10-CM | POA: Diagnosis not present

## 2021-10-23 DIAGNOSIS — I340 Nonrheumatic mitral (valve) insufficiency: Secondary | ICD-10-CM | POA: Diagnosis not present

## 2021-10-23 DIAGNOSIS — J449 Chronic obstructive pulmonary disease, unspecified: Secondary | ICD-10-CM

## 2021-10-23 DIAGNOSIS — Z8673 Personal history of transient ischemic attack (TIA), and cerebral infarction without residual deficits: Secondary | ICD-10-CM | POA: Insufficient documentation

## 2021-10-23 DIAGNOSIS — Z87891 Personal history of nicotine dependence: Secondary | ICD-10-CM | POA: Insufficient documentation

## 2021-10-23 DIAGNOSIS — Z7982 Long term (current) use of aspirin: Secondary | ICD-10-CM | POA: Diagnosis not present

## 2021-10-23 DIAGNOSIS — I428 Other cardiomyopathies: Secondary | ICD-10-CM | POA: Diagnosis not present

## 2021-10-23 DIAGNOSIS — Z79890 Hormone replacement therapy (postmenopausal): Secondary | ICD-10-CM | POA: Insufficient documentation

## 2021-10-23 DIAGNOSIS — I5022 Chronic systolic (congestive) heart failure: Secondary | ICD-10-CM | POA: Diagnosis not present

## 2021-10-23 DIAGNOSIS — E039 Hypothyroidism, unspecified: Secondary | ICD-10-CM | POA: Diagnosis not present

## 2021-10-23 DIAGNOSIS — F419 Anxiety disorder, unspecified: Secondary | ICD-10-CM

## 2021-10-23 DIAGNOSIS — Z79899 Other long term (current) drug therapy: Secondary | ICD-10-CM | POA: Diagnosis not present

## 2021-10-23 DIAGNOSIS — R911 Solitary pulmonary nodule: Secondary | ICD-10-CM | POA: Insufficient documentation

## 2021-10-23 DIAGNOSIS — Z8249 Family history of ischemic heart disease and other diseases of the circulatory system: Secondary | ICD-10-CM | POA: Insufficient documentation

## 2021-10-23 DIAGNOSIS — I69392 Facial weakness following cerebral infarction: Secondary | ICD-10-CM | POA: Diagnosis not present

## 2021-10-23 DIAGNOSIS — J439 Emphysema, unspecified: Secondary | ICD-10-CM | POA: Insufficient documentation

## 2021-10-23 LAB — BASIC METABOLIC PANEL
Anion gap: 6 (ref 5–15)
BUN: 12 mg/dL (ref 8–23)
CO2: 26 mmol/L (ref 22–32)
Calcium: 9.1 mg/dL (ref 8.9–10.3)
Chloride: 107 mmol/L (ref 98–111)
Creatinine, Ser: 0.77 mg/dL (ref 0.44–1.00)
GFR, Estimated: >60 mL/min (ref >60)
Glucose, Bld: 100 mg/dL — ABNORMAL HIGH (ref 70–99)
Potassium: 4.5 mmol/L (ref 3.5–5.1)
Sodium: 139 mmol/L (ref 135–145)

## 2021-10-23 MED ORDER — LOSARTAN POTASSIUM 25 MG PO TABS: 12.5000 mg | ORAL_TABLET | Freq: Every day | ORAL | 5 refills | Status: DC

## 2021-10-23 NOTE — Patient Instructions (Addendum)
DECREASE Losartan to 12.5 mg, one half tab nightly at bedtime  Labs today We will only contact you if something comes back abnormal or we need to make some changes. Otherwise no news is good news!  Please be sure to contact neurology for a post hospital followup [phone]  Please be sure to follow up with your primary care (PCP) follow up regarding your medication for anxiety   Your physician recommends that you schedule a follow-up appointment in: 3-4 weeks with the HF pharmacy team, in 6 weeks  in the Advanced Practitioners (PA/NP) Clinic, and in 12 weeks with Dr Aundra Dubin and echo  Your physician has requested that you have an echocardiogram. Echocardiography is a painless test that uses sound waves to create images of your heart. It provides your doctor with information about the size and shape of your heart and how well your hearts chambers and valves are working. This procedure takes approximately one hour. There are no restrictions for this procedure.  Do the following things EVERYDAY: Weigh yourself in the morning before breakfast. Write it down and keep it in a log. Take your medicines as prescribed Eat low salt foods--Limit salt (sodium) to 2000 mg per day.  Stay as active as you can everyday Limit all fluids for the day to less than 2 liters  At the [facility], you and your health needs are our priority. As part of our continuing mission to provide you with exceptional heart care, we have created designated Provider Care Teams. These Care Teams include your primary Cardiologist (physician) and Advanced Practice Providers (APPs- Physician Assistants and Nurse Practitioners) who all work together to provide you with the care you need, when you need it.   You may see any of the following providers on your designated Care Team at your next follow up: Dr Glori Bickers Dr Haynes Kerns, NP Lyda Jester, [place] [facility] [place],  [place] Audry Riles, PharmD   Please be sure to bring in all your medications bottles to every appointment.    If you have any questions or concerns before your next appointment please send [place] a message through [place] or call our office at [phone].    TO LEAVE A MESSAGE FOR THE NURSE SELECT OPTION 2, PLEASE LEAVE A MESSAGE INCLUDING: YOUR NAME DATE OF BIRTH CALL BACK NUMBER REASON FOR CALL**this is important as we prioritize the call backs  YOU WILL RECEIVE A CALL BACK THE SAME DAY AS LONG AS YOU CALL BEFORE 4:00 PM

## 2021-10-23 NOTE — Progress Notes (Signed)
ReDS Vest / Clip - 10/23/21 1400       ReDS Vest / Clip   Station Marker A    Ruler Value 27    ReDS Value Range Low volume    ReDS Actual Value 27

## 2021-11-06 ENCOUNTER — Other Ambulatory Visit (HOSPITAL_COMMUNITY): Payer: Self-pay

## 2021-11-12 NOTE — Progress Notes (Signed)
Office Visit Note  Patient: Stacey Hartman             Date of Birth: 1956-01-08           MRN: [id number]             PCP: Smith Robert, MD Referring: Smith Robert, MD Visit Date: 11/24/2021 Occupation: @GUAROCC @  Subjective:  Pain in joints and pulmonary hypertension  History of Present Illness: Stacey Hartman is a 66 y.o. female seen in consultation per request of her PCP.  According the patient about 3 months ago she started having increased shortness of breath.  She was referred to [facility] pulmonary and was evaluated by Dr. Sherene Sires.  She states Dr. Sherene Sires ordered echocardiogram which showed pulmonary hypertension and low ejection fraction.  She was hospitalized due to acute systolic congestive heart failure.  She was under care of Dr. Shirlee Latch.  She also had a stroke while she was in the hospital with left-sided numbness and left facial droop which gradually recovered.  She was placed on Plavix.  She also has history of COPD and smoked for 20 years.  She was found to have a thoracic aneurysm which was 4 cm in size.  Patient gives history of pain in her bilateral hands for several years.  She describes pain mostly over her bilateral CMC joints.  She worked as a Youth worker for many years.  She also has some discomfort in her elbows.  None of the other joints are painful.  States that she was diagnosed with fibromyalgia syndrome several years ago.  There is family history of rheumatoid arthritis on the paternal side per patient.  She is gravida 2, para 2, miscarriages 0.  There is no history of DVTs.  Activities of Daily Living:  Patient reports morning stiffness for 15 minutes.   Patient Reports nocturnal pain.  Difficulty dressing/grooming: Denies Difficulty climbing stairs: Reports Difficulty getting out of chair: Denies Difficulty using hands for taps, buttons, cutlery, and/or writing: Reports  Review of Systems  Constitutional:  Positive for fatigue and weight loss.  HENT:   Positive for mouth dryness and nose dryness. Negative for mouth sores.   Eyes:  Positive for pain, itching and dryness.  Respiratory:  Positive for shortness of breath and difficulty breathing.   Cardiovascular:  Positive for chest pain and palpitations.  Gastrointestinal:  Negative for blood in stool, constipation and diarrhea.  Endocrine: Negative for increased urination.  Genitourinary:  Negative for difficulty urinating.  Musculoskeletal:  Positive for joint pain, joint pain, joint swelling and morning stiffness. Negative for myalgias, muscle tenderness and myalgias.  Skin:  Positive for color change. Negative for rash, redness and sensitivity to sunlight.  Allergic/Immunologic: Negative for susceptible to infections.  Neurological:  Positive for dizziness and weakness. Negative for numbness, headaches and memory loss.  Hematological:  Positive for bruising/bleeding tendency. Negative for swollen glands.  Psychiatric/Behavioral:  Positive for depressed mood and sleep disturbance. Negative for confusion. The patient is nervous/anxious.    PMFS History:  Patient Active Problem List   Diagnosis Date Noted   Cerebral embolism with cerebral infarction 10/07/2021   Depressed left ventricular ejection fraction 10/05/2021   Pulmonary hypertension, unspecified (HCC) 10/05/2021   Nonrheumatic mitral valve regurgitation 10/05/2021   LBBB (left bundle branch block) 10/05/2021   Acute systolic heart failure (HCC) 10/05/2021   DOE (dyspnea on exertion) 08/25/2021    Past Medical History:  Diagnosis Date   Hypothyroid     Family  History  Problem Relation Age of Onset   Melanoma Mother    Heart attack Father    Melanoma Sister    Melanoma Brother    Heart failure Brother    Healthy Son    Past Surgical History:  Procedure Laterality Date   ANKLE SURGERY Right    CHOLECYSTECTOMY     RIGHT/LEFT HEART CATH AND CORONARY ANGIOGRAPHY N/A 10/05/2021   Procedure: RIGHT/LEFT HEART CATH AND  CORONARY ANGIOGRAPHY;  Surgeon: Laurey Morale, MD;  Location: [facility] INVASIVE CV LAB;  Service: Cardiovascular;  Laterality: N/A;   TUBAL LIGATION     Social History   Social History Narrative   Not on file   Immunization History  Administered Date(s) Administered   Influenza, Seasonal, Injecte, Preservative Fre 06/20/2014, 05/28/2015, 06/08/2016, 06/06/2019, 06/05/2020   Pneumococcal Polysaccharide-23 10/26/2016   Tdap 12/13/2014     Objective: Vital Signs: BP 102/64 (BP Location: Right Arm, Patient Position: Sitting, Cuff Size: Normal)   Pulse 85   Ht 5\' 3"  (1.6 m)   Wt 123 lb 12.8 oz (56.2 kg)   LMP  (LMP Unknown)   BMI 21.93 kg/m    Physical Exam Vitals and nursing note reviewed.  Constitutional:      Appearance: She is well-developed.  HENT:     Head: Normocephalic and atraumatic.  Eyes:     Conjunctiva/sclera: Conjunctivae normal.  Cardiovascular:     Rate and Rhythm: Normal rate and regular rhythm.     Heart sounds: Normal heart sounds.  Pulmonary:     Effort: Pulmonary effort is normal.     Breath sounds: Normal breath sounds.     Comments: Crackles were audible in the right lung base. Abdominal:     General: Bowel sounds are normal.     Palpations: Abdomen is soft.  Musculoskeletal:     Cervical back: Normal range of motion.  Lymphadenopathy:     Cervical: No cervical adenopathy.  Skin:    General: Skin is warm and dry.     Capillary Refill: Capillary refill takes less than 2 seconds.     Comments: No nailbed capillary changes or sclerodactyly was noted.  Neurological:     Mental Status: She is alert and oriented to person, place, and time.  Psychiatric:        Behavior: Behavior normal.     Musculoskeletal Exam: Cervical spine was in good range of motion.  She had thoracic kyphosis.  Shoulder joints, elbow joints, wrist joints with good range of motion.  She had bilateral CMC and DIP thickening consistent with osteoarthritis.  Hip joints, knee joints,  ankles, MTPs and PIPs with good range of motion with no synovitis.  CDAI Exam: CDAI Score: -- Patient Global: --; Provider Global: -- Swollen: --; Tender: -- Joint Exam 11/24/2021   No joint exam has been documented for this visit   There is currently no information documented on the homunculus. Go to the Rheumatology activity and complete the homunculus joint exam.  Investigation: No additional findings.  Imaging: No results found.  Recent Labs: Lab Results  Component Value Date   WBC 6.2 10/09/2021   HGB 12.3 10/09/2021   PLT 160 10/09/2021   NA 139 10/23/2021   K 4.5 10/23/2021   CL 107 10/23/2021   CO2 26 10/23/2021   GLUCOSE 100 (H) 10/23/2021   BUN 12 10/23/2021   CREATININE 0.77 10/23/2021   BILITOT 0.4 10/05/2021   ALKPHOS 75 10/05/2021   AST 27 10/05/2021   ALT 18  10/05/2021   PROT 7.1 10/05/2021   ALBUMIN 3.9 10/05/2021   CALCIUM 9.1 10/23/2021    Speciality Comments: No specialty comments available.  Procedures:  No procedures performed Allergies: Penicillins   Assessment / Plan:     Visit Diagnoses: Polyarthralgia-patient complains of pain and discomfort in her bilateral hands and her bilateral elbows for many years.  She states the arthritis in her hands has been getting progressively worse.  She worked as a Youth worker for many years.  Pain in both hands -severe osteoarthritis involving bilateral CMC joints, PIP and DIP joints was noted.  No synovitis was noted.  Clinical findings were consistent with osteoarthritis.  Joint protection muscle strengthening was discussed.  Plan: XR Hand 2 View Right, XR Hand 2 View Left.  X-rays of bilateral hands were reviewed which were consistent with osteoarthritis.  Fibromyalgia-patient has longstanding history of fibromyalgia.  She states she was diagnosed several years ago.  She states the fibromyalgia is not very active currently.  Pulmonary hypertension (HCC) -I reviewed her records from recent  hospitalization.  Her office visit records with Dr. Shirlee Latch was also reviewed.  She was diagnosed with pulmonary hypertension by Dr. French Ana evaluate for any underlying autoimmune causes I will obtain following labs today.  She denies any history of oral ulcers, nasal ulcers, malar rash, photosensitivity, lymphadenopathy or inflammatory arthritis.  She gives history of Raynaud's phenomenon.  No nailbed capillary changes, Telengectesia, sclerodactyly was noted.  Raynaud's phenomenon is most likely related to chronic smoking.  Plan: ANA, RNP Antibody, Anti-DNA antibody, double-stranded, Sjogrens syndrome-A extractable nuclear antibody, Anti-scleroderma antibody, Anti-Smith antibody, Sjogrens syndrome-B extractable nuclear antibody, C3 and C4, Rheumatoid factor, Cyclic citrul peptide antibody, IgG, CT Chest High Resolution  DOE (dyspnea on exertion) -she has pulmonary hypertension.  I will obtain high-resolution CT to rule out ILD.  She had crackles in the right lung base.  Plan: CT Chest High Resolution  History of COPD-followed by Dr. Sherene Sires.  I reviewed progress notes.  Nonrheumatic mitral valve regurgitation  LBBB (left bundle branch block)  Chronic systolic heart failure (HCC)-diagnosed during the hospitalization in February 2023.  I reviewed discharge summary.  Thoracic aortic aneurysm without rupture, unspecified part  History of hyperlipidemia  Cerebral infarction due to embolism of right middle cerebral artery (HCC) - 09/2021.  She is on Plavix.  History of osteoporosis-she is currently not taking any treatment for osteoporosis.  Have advised her to discuss this further with her PCP.  She has significant kyphosis.  I do not have a DEXA scan in the system.  History of posttraumatic stress disorder (PTSD)  GAD (generalized anxiety disorder)  History of hypothyroidism  Former smoker - quit smoking 2003, smoked 1PPD x 20 years.  Orders: Orders Placed This Encounter  Procedures   XR  Hand 2 View Right   XR Hand 2 View Left   CT Chest High Resolution   ANA   RNP Antibody   Anti-DNA antibody, double-stranded   Sjogrens syndrome-A extractable nuclear antibody   Anti-scleroderma antibody   Anti-Smith antibody   Sjogrens syndrome-B extractable nuclear antibody   C3 and C4   Rheumatoid factor   Cyclic citrul peptide antibody, IgG   No orders of the defined types were placed in this encounter.    Follow-Up Instructions: Return for Pain in joints, pulmonary hypertension.   Pollyann Savoy, MD  Note - This record has been created using Animal nutritionist.  Chart creation errors have been sought, but may not always  have been located. Such creation errors do not reflect on  the standard of medical care.

## 2021-11-13 ENCOUNTER — Ambulatory Visit: Payer: 59 | Admitting: Allergy & Immunology

## 2021-11-13 ENCOUNTER — Encounter: Payer: Self-pay | Admitting: Adult Health

## 2021-11-13 ENCOUNTER — Inpatient Hospital Stay: Payer: 59 | Admitting: Adult Health

## 2021-11-13 NOTE — Progress Notes (Deleted)
? ? ?PATIENT: Stacey Hartman ?DOB: 1955/09/24 ? ?REASON FOR VISIT: follow up ?HISTORY FROM: patient ?PRIMARY NEUROLOGIST:  ? ?HISTORY OF PRESENT ILLNESS: ?Today 11/13/21 ? ?Patient was having a cardiac cath on February 6 when having a cardiac MRI she developed a headache left face and arm numbness as well as left facial droop.  MRI showed Punctate acute infarct in R frontal cortex. Mild-mod microvascular ischemic disease.Code Stroke CT head No acute abnormality. Chronic lacunar infarcts of L basal ganglia. CTA head & neck No large vessel occlusions, significant stenosis, or evidence of dissection in the head or neck. ? ?2D Echo LVEF <20%, global hypokinesis of LV, grade I diastolic dysfunction, severely dilated LA, Mild MVR, Tricuspid aortic valve. ? ?LDL 89-- currently on Lipitor, hgbA1c 5.4 ? ?Patient had left lip and hand numbness followed by HA after MRI while in the hospital- thought to be a complicated headache/migraine. ? ?History of CHF- had cardiac cath on 2/6 without stenosis. Cardiac monitor recommeded.  ? ?REVIEW OF SYSTEMS: Out of a complete 14 system review of symptoms, the patient complains only of the following symptoms, and all other reviewed systems are negative. ? ?ALLERGIES: ?Allergies  ?Allergen Reactions  ? Penicillins   ?  Trouble breathing   ? ? ?HOME MEDICATIONS: ?Outpatient Medications Prior to Visit  ?Medication Sig Dispense Refill  ? ALPRAZolam (XANAX) 0.25 MG tablet Take 1 tablet (0.25 mg total) by mouth 2 (two) times daily as needed for anxiety. 20 tablet 0  ? aspirin 81 MG chewable tablet Chew 1 tablet (81 mg total) by mouth daily. 30 tablet 5  ? atorvastatin (LIPITOR) 40 MG tablet Take 1 tablet (40 mg total) by mouth daily. 30 tablet 5  ? carvedilol (COREG) 3.125 MG tablet Take 1 tablet (3.125 mg total) by mouth 2 (two) times daily with a meal. 60 tablet 5  ? clopidogrel (PLAVIX) 75 MG tablet Take 1 tablet (75 mg total) by mouth daily. 30 tablet 5  ? dapagliflozin propanediol  (FARXIGA) 10 MG TABS tablet Take 1 tablet (10 mg total) by mouth daily. 30 tablet 5  ? gabapentin (NEURONTIN) 300 MG capsule Take 300 mg by mouth at bedtime.    ? levothyroxine (SYNTHROID) 150 MCG tablet Take 1 tablet (150 mcg total) by mouth daily before breakfast. 30 tablet 3  ? losartan (COZAAR) 25 MG tablet Take 0.5 tablets (12.5 mg total) by mouth at bedtime. 15 tablet 5  ? spironolactone (ALDACTONE) 25 MG tablet Take 1 tablet (25 mg total) by mouth daily. 30 tablet 5  ? ?No facility-administered medications prior to visit.  ? ? ?PAST MEDICAL HISTORY: ?Past Medical History:  ?Diagnosis Date  ? Hypothyroid   ? ? ?PAST SURGICAL HISTORY: ?Past Surgical History:  ?Procedure Laterality Date  ? RIGHT/LEFT HEART CATH AND CORONARY ANGIOGRAPHY N/A 10/05/2021  ? Procedure: RIGHT/LEFT HEART CATH AND CORONARY ANGIOGRAPHY;  Surgeon: Larey Dresser, MD;  Location: [facility];  Service: Cardiovascular;  Laterality: N/A;  ? ? ?FAMILY HISTORY: ?Family History  ?Problem Relation Age of Onset  ? Heart attack Father   ? Heart failure Brother   ? ? ?SOCIAL HISTORY: ?Social History  ? ?Socioeconomic History  ? Marital status: Unknown  ?  Spouse name: Not on file  ? Number of children: Not on file  ? Years of education: Not on file  ? Highest education level: Not on file  ?Occupational History  ? Not on file  ?Tobacco Use  ? Smoking status: Former  ?  Smokeless tobacco: Never  ?Substance and Sexual Activity  ? Alcohol use: Not Currently  ? Drug use: Never  ? Sexual activity: Not on file  ?Other Topics Concern  ? Not on file  ?Social History Narrative  ? Not on file  ? ?Social Determinants of Health  ? ?Financial Resource Strain: Not on file  ?Food Insecurity: Not on file  ?Transportation Needs: Not on file  ?Physical Activity: Not on file  ?Stress: Not on file  ?Social Connections: Not on file  ?Intimate Partner Violence: Not on file  ? ? ? ? ?PHYSICAL EXAM ? ?There were no vitals filed for this visit. ?There is no height or  weight on file to calculate BMI. ? ?Generalized: Well developed, in no acute distress  ? ?Neurological examination  ?Mentation: Alert oriented to time, place, history taking. Follows all commands speech and language fluent ?Cranial nerve II-XII: Pupils were equal round reactive to light. Extraocular movements were full, visual field were full on confrontational test. Facial sensation and strength were normal. Uvula tongue midline. Head turning and shoulder shrug  were normal and symmetric. ?Motor: The motor testing reveals 5 over 5 strength of all 4 extremities. Good symmetric motor tone is noted throughout.  ?Sensory: Sensory testing is intact to soft touch on all 4 extremities. No evidence of extinction is noted.  ?Coordination: Cerebellar testing reveals good finger-nose-finger and heel-to-shin bilaterally.  ?Gait and station: Gait is normal. Tandem gait is normal. Romberg is negative. No drift is seen.  ?Reflexes: Deep tendon reflexes are symmetric and normal bilaterally.  ? ?DIAGNOSTIC DATA (LABS, IMAGING, TESTING) ?- I reviewed patient records, labs, notes, testing and imaging myself where available. ? ?Lab Results  ?Component Value Date  ? WBC 6.2 10/09/2021  ? HGB 12.3 10/09/2021  ? HCT 36.6 10/09/2021  ? MCV 92.2 10/09/2021  ? PLT 160 10/09/2021  ? ?   ?Component Value Date/Time  ? NA 139 10/23/2021 1506  ? NA 147 (H) 08/25/2021 1516  ? K 4.5 10/23/2021 1506  ? CL 107 10/23/2021 1506  ? CO2 26 10/23/2021 1506  ? GLUCOSE 100 (H) 10/23/2021 1506  ? BUN 12 10/23/2021 1506  ? BUN 15 08/25/2021 1516  ? CREATININE 0.77 10/23/2021 1506  ? CALCIUM 9.1 10/23/2021 1506  ? PROT 7.1 10/05/2021 1551  ? ALBUMIN 3.9 10/05/2021 1551  ? AST 27 10/05/2021 1551  ? ALT 18 10/05/2021 1551  ? ALKPHOS 75 10/05/2021 1551  ? BILITOT 0.4 10/05/2021 1551  ? GFRNONAA >60 10/23/2021 1506  ? ?Lab Results  ?Component Value Date  ? CHOL 158 10/06/2021  ? HDL 50 10/06/2021  ? [place] 89 10/06/2021  ? TRIG 96 10/06/2021  ? CHOLHDL 3.2  10/06/2021  ? ?Lab Results  ?Component Value Date  ? HGBA1C 5.4 10/06/2021  ? ?No results found for: VITAMINB12 ?Lab Results  ?Component Value Date  ? TSH 18.030 (H) 10/05/2021  ? ? ? ? ?ASSESSMENT AND PLAN ?66 y.o. year old female  has a past medical history of Hypothyroid. here with *** ? ?Continue aspirin 81 mg daily-any additional antithrombotic regimen will be deferred to cardiology) ?BP Goal <130/90 unless otherwise directed by cardiology ?LDL goal <70 on Lipitor  ?HbgA1c gaol <6.5 % ?Cardiac monitor ? ?Maintiain healthy diet and exercise ? ? ?Ward Givens, MSN, NP-C 11/13/2021, 8:10 AM ?[facility] ?[address], [address] ?[place], [place] [address] ?[phone] ? ? ?

## 2021-11-16 NOTE — Progress Notes (Incomplete)
***In Progress*** ? ?  ?Advanced Heart Failure Clinic Note  ? ?Primary Care: Stacey Schwalbe, MD ?HF Cardiologist: Dr. Aundra Hartman ?  ?HPI: ?Stacey Hartman is a 66 y.o. with a history of hypothyroidism, former tobacco abuse (quit 20 years ago), COPD, and new systolic heart failure with severely reduced EF.  ?  ?Father and brother died from heart attack.  ?  ?Saw PCP for increased shortness of breath. Echo on 09/2021 showed EF < 20% RV normal.  ?  ?Sent to ED from cardiology office after echo showed EF < 20%. Had CP and SOB in waiting room/triage and taken for urgent R/LHC. Cardiac cath showed  no significant CAD, normal PA and PCWP, LVEDP of 28 mmHg, and CO preserved.  cMRI showed LVEF 12% with no myocardial LGE. Elevated T1 indices, ECV 42% in basal septum. Developed acute left sided facial numbness during cMRI on ***10/07/2021. Code stroke was called. MRI brain showed punctate acute infarct in high right frontal cortex. AF was not detected during admit. 30-day monitor was arranged to evaluate for arrhythmias. She was continued on ASA +Plavix x 3 weeks, then monotherapy, per Neuro recs. She was diuresed with IV lasix and started on GDMT.  PYP obtained and equivocal for transthyretin amyloidosis. She was discharged home, weight was 122 lbs. ? ?Patient was last seen 10/23/2021 by APP for post hospital HF follow up. Her daughter in law was killed the day before in MVA, and patient was understandably struggling emotionally during visit. She reported SOB walking further distances on flat ground. Reported some dizziness and ankle swelling. Denied palpitations, CP, or abnormal bleeding. Appetite was ok. Denied fever or chills. Weight at home was 122 pounds. Reported she was taking all medications. She also reported she slept reclined in hospital bed. Of note at this visit, she asked for a Xanax refill. ? ?Today he returns to HF clinic for pharmacist medication titration. At last visit with MD, losartan was decreased from 12.5 mg BID  to 12.5 mg qhs.  ? ?Overall feeling ***. ?Dizziness, lightheadedness, fatigue:  ?Chest pain or palpitations: ? ?How is your breathing?: *** ?SOB: ?Able to complete all ADLs. Activity level *** ? ?Weight at home pounds. Takes furosemide/torsemide/bumex *** mg *** daily.  ?LEE ?PND/Orthopnea ? ?Appetite *** ?Low-salt diet:  ? ?Physical Exam ?Cost/affordability of meds ? ? ?HF Medications: ?Carvedilol 3.125 mg BID ?Losartan 12.5 mg qhs ?Spirnolactone 25 mg daily ?Farxiga 10 mg daily ? ? ?Has the patient been experiencing any side effects to the medications prescribed?  {YES NO:22349} ? ?Does the patient have any problems obtaining medications due to transportation or finances?   {YES NO:22349} ? ?Understanding of regimen: {excellent/good/fair/poor:19665} ?Understanding of indications: {excellent/good/fair/poor:19665} ?Potential of compliance: {excellent/good/fair/poor:19665} ?Patient understands to avoid NSAIDs. ?Patient understands to avoid decongestants. ?  ? ?Pertinent Lab Values: ?Serum creatinine ***, BUN ***, Potassium ***, Sodium ***, BNP ***, Magnesium ***, Digoxin ***  ? ?Vital Signs: ?Weight: *** (last clinic weight: ***) ?Blood pressure: ***  ?Heart rate: ***  ? ?Assessment/Plan: ?1. Chronic systolic CHF: Nonischemic cardiomyopathy. Etiology not certain. No CAD on LHC. Has LBBB of uncertain duration.  She has a strong family history of CHF (father died at 18, brother with "CHF," nephew with "CHF" recently died as well). 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  Echo 2/23 showed EF < 20% with diffuse hypokinesis and normal RV.  No LV thrombus noted. R/LHC 02/23: No significant coronary disease, normal RA pressure and PCWP, LVEDP 28 mmHg, preserved CO. cMRI (2/23): No myocardial LGE. Elevated  T1 indices, ECV was 42% in the basal septum. PYP not suggestive of TTR amyloidosis. Will arrange for genetic testing. Stable NYHA II today, she is not volume overloaded on exam. No BP room for GDMT titration. ?- Decrease  losartan to 12.5 mg q hs. ?- Continue Coreg 3.125 mg bid. ?- Continue Spiro 25 mg daily. ?- Continue Farxiga 10 mg daily.  ?- Plan to repeat echo in 3 months after titration of GDMT. ?- She would be a candidate for LVAD in future if she deteriorates. ?2. CVA: Code Stroke during cMRI. Head CT negative, MRI brain + punctate acute infarct in the high right frontal cortex. CTA Head and Neck no LVO. Suspect infarct due to probable cardio embolic and small vessel disease. No Afib detected during admit.  ?- Currently wearing 30 day monitor for surveillance.  ?- DAPT w/ ASA + Plavix x 3 weeks then monotherapy per Neuro. ?- Continue atrorva 40 daily (LDL 89 mg/dL).  ?- She has not made post-hospital follow up appt, gave # to do so. ?3. Mitral valve regurgitation: Mild to moderate on recent echo. ?- Mild on limited echo this admit.  ?4. Hypothyroidism: Back on levothyroxine.  ?- Needs follow up with PCP.  ?5. COPD/prior tobacco use:  Says she quit smoking 20 years ago. Chest CT + for emphysema + 4 mm RUL Pulmonary nodule. ?- Need f/u scan in 12 months. ?- She has follow up with Dr. [name] next month. ?6. Thoracic Aneurysm: Noted on chest CT, 4.0 cm, former smoker. ?- Needs annual f/u.  ?- BP control. Continue ? blocker. ?- Continue atorvastatin 40 mg.  ?7. Anxiety: Unable to tolerate SSRIs. Given 20 tablets of alprazolam on discharge (10/09/21), further refills need to be addressed with PCP. This was discussed again at visit today. ?8. Physical Deconditioning: HH PT with [facility]. Consider CR after finished with PT.  ?  ?Follow up in 3 weeks with APP, and 9 weeks with Dr. Aundra Hartman + echo. ? ? ?Stacey Hartman, PharmD, BCPS, BCCP, CPP ?Heart Failure Clinic Pharmacist ?[phone] ? ? ?

## 2021-11-17 ENCOUNTER — Inpatient Hospital Stay (HOSPITAL_COMMUNITY): Admission: RE | Admit: 2021-11-17 | Payer: 59 | Source: Ambulatory Visit

## 2021-11-18 ENCOUNTER — Other Ambulatory Visit: Payer: Self-pay

## 2021-11-18 DIAGNOSIS — R0609 Other forms of dyspnea: Secondary | ICD-10-CM

## 2021-11-18 NOTE — Progress Notes (Signed)
Looking in patients chart for review before appt next day and no pfts were ordered as req. In AVS. Order placed.  ?

## 2021-11-19 ENCOUNTER — Other Ambulatory Visit (HOSPITAL_COMMUNITY): Payer: Self-pay | Admitting: Cardiology

## 2021-11-19 ENCOUNTER — Ambulatory Visit: Payer: 59 | Admitting: Internal Medicine

## 2021-11-19 DIAGNOSIS — I4891 Unspecified atrial fibrillation: Secondary | ICD-10-CM

## 2021-11-19 DIAGNOSIS — I639 Cerebral infarction, unspecified: Secondary | ICD-10-CM

## 2021-11-19 DIAGNOSIS — I447 Left bundle-branch block, unspecified: Secondary | ICD-10-CM

## 2021-11-19 DIAGNOSIS — I69392 Facial weakness following cerebral infarction: Secondary | ICD-10-CM

## 2021-11-19 DIAGNOSIS — I428 Other cardiomyopathies: Secondary | ICD-10-CM

## 2021-11-19 NOTE — Progress Notes (Deleted)
? ?Stacey Hartman, female    DOB: Apr 05, 1956,    MRN: [id number] ? ? ?Brief patient profile:  ?65   yowf quit smoking  around 2002 with cough/sob completely improved to point where no symptoms including walking up to several miles including hills but dx fibromyalgia since 2012  then August 2021 salmonella/rx as out pt with "pcn" then severe skin peeling lost 22 lb and persistent diarrhea/sob/ cough referred to pulmonary clinic in [facility]  08/25/2021 by Stacey Hartman already under care of skin doctor in [place], GI doctor in [place], rheumatology eval pending.  ? ? ? ? ?History of Present Illness  ?08/25/2021  Pulmonary/ 1st office eval/ Stacey Hartman / Stacey Hartman Office  ?Chief Complaint  ?Patient presents with  ? Consult  ?  Hx of COPD. After penicillin has noticed struggle with breathing since then (03/2020).  ? ?Coughs up green mucus   ? Dyspnea:  getting worse to point of 100 ft doe ?Cough: congested cough worse when lies down  ?Sleep: bed is flat with 3 pillows  ?SABA use: spiriva and albuterol helped / has not tried pred for this illness but took it previously for "bronchitis" and seemed to help ?Omeprazole bid per GI  ?Rec ?Stiolto one puff a day x one week and if not better start on 2 puffs each am  ?Omeprazole 20 mg Take 30- 60 min before your first and last meals of the day  ?GERD diet reviewed, bed blocks rec   ?Please remember to go to the lab department   allergy profile  Eos 0.3  / IgE 46    alpha one AT phenotype  MM  Level 146 ? ? If  have ulcerative colitis it may be affecting your airways - discuss with your GI doctor  ?Please schedule a follow up office visit in 6 weeks, call sooner if needed with pfts next available  ? ? ?11/19/2021  f/u ov/[place] office/Stacey Hartman re: cough/ sob in UC maint on ***  ?No chief complaint on file. ?  ?Dyspnea:  *** ?Cough: *** ?Sleeping: *** ?SABA use: *** ?02: *** ?Covid status: *** ?Lung cancer screening: *** ? ? ?No obvious day to day or daytime variability or assoc excess/  purulent sputum or mucus plugs or hemoptysis or cp or chest tightness, subjective wheeze or overt sinus or hb symptoms.  ? ?*** without nocturnal  or early am exacerbation  of respiratory  c/o's or need for noct saba. Also denies any obvious fluctuation of symptoms with weather or environmental changes or other aggravating or alleviating factors except as outlined above  ? ?No unusual exposure hx or h/o childhood pna/ asthma or knowledge of premature birth. ? ?Current Allergies, Complete Past Medical History, Past Surgical History, Family History, and Social History were reviewed in [employer] record. ? ?ROS  The following are not active complaints unless bolded ?Hoarseness, sore throat, dysphagia, dental problems, itching, sneezing,  nasal congestion or discharge of excess mucus or purulent secretions, ear ache,   fever, chills, sweats, unintended wt loss or wt gain, classically pleuritic or exertional cp,  orthopnea pnd or arm/hand swelling  or leg swelling, presyncope, palpitations, abdominal pain, anorexia, nausea, vomiting, diarrhea  or change in bowel habits or change in bladder habits, change in stools or change in urine, dysuria, hematuria,  rash, arthralgias, visual complaints, headache, numbness, weakness or ataxia or problems with walking or coordination,  change in mood or  memory. ?      ? ?No outpatient medications have  been marked as taking for the 11/19/21 encounter (Appointment) with Stacey Rockers, MD.  ?      ? ? ?Objective:  ?  ? ?  ?Wt Readings from Last 3 Encounters:  ?10/23/21 124 lb 9.6 oz (56.5 kg)  ?10/09/21 122 lb 6.4 oz (55.5 kg)  ?10/05/21 125 lb 6.4 oz (56.9 kg)  ?  ? ? ?Vital signs reviewed  11/19/2021  - Note at rest 02 sats  ***% on ***  ? ?General appearance:    ***   ? ? ?    ? ? ?I personally reviewed images and agree with radiology impression as follows:  ?CXR:   pa and lat 10/05/21  ?1. The appearance the chest suggests acute bronchitis. ?2. Aortic  atherosclerosis.   ? ?   ?Assessment  ? ?  ?  ?  ?  ?  ?  ?   ?

## 2021-11-23 ENCOUNTER — Other Ambulatory Visit (HOSPITAL_COMMUNITY): Payer: Self-pay | Admitting: *Deleted

## 2021-11-24 ENCOUNTER — Ambulatory Visit (INDEPENDENT_AMBULATORY_CARE_PROVIDER_SITE_OTHER): Payer: 59 | Admitting: Rheumatology

## 2021-11-24 ENCOUNTER — Encounter: Payer: Self-pay | Admitting: Rheumatology

## 2021-11-24 ENCOUNTER — Ambulatory Visit (INDEPENDENT_AMBULATORY_CARE_PROVIDER_SITE_OTHER): Payer: 59

## 2021-11-24 ENCOUNTER — Other Ambulatory Visit: Payer: Self-pay

## 2021-11-24 ENCOUNTER — Ambulatory Visit: Payer: Self-pay

## 2021-11-24 VITALS — BP 102/64 | HR 85 | Ht 63.0 in | Wt 123.8 lb

## 2021-11-24 DIAGNOSIS — M79642 Pain in left hand: Secondary | ICD-10-CM

## 2021-11-24 DIAGNOSIS — M2550 Pain in unspecified joint: Secondary | ICD-10-CM

## 2021-11-24 DIAGNOSIS — M797 Fibromyalgia: Secondary | ICD-10-CM | POA: Diagnosis not present

## 2021-11-24 DIAGNOSIS — M79641 Pain in right hand: Secondary | ICD-10-CM

## 2021-11-24 DIAGNOSIS — Z87891 Personal history of nicotine dependence: Secondary | ICD-10-CM

## 2021-11-24 DIAGNOSIS — I340 Nonrheumatic mitral (valve) insufficiency: Secondary | ICD-10-CM

## 2021-11-24 DIAGNOSIS — I63411 Cerebral infarction due to embolism of right middle cerebral artery: Secondary | ICD-10-CM

## 2021-11-24 DIAGNOSIS — I447 Left bundle-branch block, unspecified: Secondary | ICD-10-CM

## 2021-11-24 DIAGNOSIS — Z8639 Personal history of other endocrine, nutritional and metabolic disease: Secondary | ICD-10-CM

## 2021-11-24 DIAGNOSIS — F411 Generalized anxiety disorder: Secondary | ICD-10-CM

## 2021-11-24 DIAGNOSIS — Z8659 Personal history of other mental and behavioral disorders: Secondary | ICD-10-CM

## 2021-11-24 DIAGNOSIS — R0609 Other forms of dyspnea: Secondary | ICD-10-CM

## 2021-11-24 DIAGNOSIS — Z8739 Personal history of other diseases of the musculoskeletal system and connective tissue: Secondary | ICD-10-CM

## 2021-11-24 DIAGNOSIS — I272 Pulmonary hypertension, unspecified: Secondary | ICD-10-CM | POA: Diagnosis not present

## 2021-11-24 DIAGNOSIS — Z8709 Personal history of other diseases of the respiratory system: Secondary | ICD-10-CM

## 2021-11-24 DIAGNOSIS — I5022 Chronic systolic (congestive) heart failure: Secondary | ICD-10-CM

## 2021-11-24 DIAGNOSIS — I712 Thoracic aortic aneurysm, without rupture, unspecified: Secondary | ICD-10-CM

## 2021-11-26 LAB — RNP ANTIBODY: Ribonucleic Protein(ENA) Antibody, IgG: <1 AI

## 2021-11-26 LAB — SJOGRENS SYNDROME-A EXTRACTABLE NUCLEAR ANTIBODY: SSA (Ro) (ENA) Antibody, IgG: <1 AI

## 2021-11-26 LAB — SJOGRENS SYNDROME-B EXTRACTABLE NUCLEAR ANTIBODY: SSB (La) (ENA) Antibody, IgG: <1 AI

## 2021-11-26 LAB — C3 AND C4
C3 Complement: 129 mg/dL (ref 83–193)
C4 Complement: 28 mg/dL (ref 15–57)

## 2021-11-26 LAB — ANA: Anti Nuclear Antibody (ANA): NEGATIVE

## 2021-11-26 LAB — ANTI-DNA ANTIBODY, DOUBLE-STRANDED: ds DNA Ab: <1 IU/mL

## 2021-11-26 LAB — RHEUMATOID FACTOR: Rheumatoid fact SerPl-aCnc: <14 IU/mL (ref <14)

## 2021-11-26 LAB — CYCLIC CITRUL PEPTIDE ANTIBODY, IGG: Cyclic Citrullin Peptide Ab: <16 UNITS

## 2021-11-26 LAB — ANTI-SCLERODERMA ANTIBODY: Scleroderma (Scl-70) (ENA) Antibody, IgG: <1 AI

## 2021-11-26 LAB — ANTI-SMITH ANTIBODY: ENA SM Ab Ser-aCnc: <1 AI

## 2021-11-26 NOTE — Progress Notes (Signed)
All the labs are within normal limits.  We will discuss results at the follow-up visit.

## 2021-12-08 ENCOUNTER — Ambulatory Visit (HOSPITAL_COMMUNITY)
Admission: RE | Admit: 2021-12-08 | Discharge: 2021-12-08 | Disposition: A | Discharge status: Home / Self Care | Payer: 59 | Source: Ambulatory Visit | Attending: Internal Medicine | Admitting: Internal Medicine

## 2021-12-08 ENCOUNTER — Encounter (HOSPITAL_COMMUNITY): Payer: 59

## 2021-12-08 DIAGNOSIS — R0609 Other forms of dyspnea: Secondary | ICD-10-CM | POA: Insufficient documentation

## 2021-12-08 LAB — PULMONARY FUNCTION TEST
DL/VA % pred: 85 %
DL/VA: 3.62 ml/min/mmHg/L
DLCO unc % pred: 83 %
DLCO unc: 16.09 ml/min/mmHg
FEF 25-75 Post: 2.34 L/sec
FEF 25-75 Pre: 2.5 L/sec
FEF2575-%Change-Post: -6 %
FEF2575-%Pred-Post: 112 %
FEF2575-%Pred-Pre: 120 %
FEV1-%Change-Post: -1 %
FEV1-%Pred-Post: 96 %
FEV1-%Pred-Pre: 97 %
FEV1-Post: 2.24 L
FEV1-Pre: 2.27 L
FEV1FVC-%Change-Post: -3 %
FEV1FVC-%Pred-Pre: 105 %
FEV6-%Change-Post: 0 %
FEV6-%Pred-Post: 96 %
FEV6-%Pred-Pre: 95 %
FEV6-Post: 2.8 L
FEV6-Pre: 2.79 L
FEV6FVC-%Change-Post: -1 %
FEV6FVC-%Pred-Post: 102 %
FEV6FVC-%Pred-Pre: 104 %
FVC-%Change-Post: 2 %
FVC-%Pred-Post: 93 %
FVC-%Pred-Pre: 91 %
FVC-Post: 2.85 L
FVC-Pre: 2.79 L
Post FEV1/FVC ratio: 78 %
Post FEV6/FVC ratio: 98 %
Pre FEV1/FVC ratio: 82 %
Pre FEV6/FVC Ratio: 100 %
RV % pred: 168 %
RV: 3.43 L
TLC % pred: 126 %
TLC: 6.2 L

## 2021-12-08 MED ORDER — ALBUTEROL SULFATE (2.5 MG/3ML) 0.083% IN NEBU
2.5000 mg | INHALATION_SOLUTION | Freq: Once | RESPIRATORY_TRACT | Status: AC
  Administered 2021-12-08: 2.5 mg via RESPIRATORY_TRACT

## 2021-12-10 NOTE — Progress Notes (Deleted)
Office Visit Note  Patient: Stacey Hartman             Date of Birth: 14-Aug-1956           MRN: [id number]             PCP: Vidal Schwalbe, MD Referring: Vidal Schwalbe, MD Visit Date: 12/22/2021 Occupation: '@GUAROCC'$ @  Subjective:  No chief complaint on file.   History of Present Illness: Stacey Hartman is a 66 y.o. female ***   Activities of Daily Living:  Patient reports morning stiffness for *** {minute/hour:19697}.   Patient {ACTIONS;DENIES/REPORTS:21021675::"Denies"} nocturnal pain.  Difficulty dressing/grooming: {ACTIONS;DENIES/REPORTS:21021675::"Denies"} Difficulty climbing stairs: {ACTIONS;DENIES/REPORTS:21021675::"Denies"} Difficulty getting out of chair: {ACTIONS;DENIES/REPORTS:21021675::"Denies"} Difficulty using hands for taps, buttons, cutlery, and/or writing: {ACTIONS;DENIES/REPORTS:21021675::"Denies"}  No Rheumatology ROS completed.   PMFS History:  Patient Active Problem List   Diagnosis Date Noted   Cerebral embolism with cerebral infarction 10/07/2021   Depressed left ventricular ejection fraction 10/05/2021   Pulmonary hypertension, unspecified ([place]) 10/05/2021   Nonrheumatic mitral valve regurgitation 10/05/2021   LBBB (left bundle branch block) 93/81/8299   Acute systolic heart failure ([place]) 10/05/2021   DOE (dyspnea on exertion) 08/25/2021    Past Medical History:  Diagnosis Date   Hypothyroid     Family History  Problem Relation Age of Onset   Melanoma Mother    Heart attack Father    Melanoma Sister    Melanoma Brother    Heart failure Brother    Healthy Son    Past Surgical History:  Procedure Laterality Date   ANKLE SURGERY Right    CHOLECYSTECTOMY     RIGHT/LEFT HEART CATH AND CORONARY ANGIOGRAPHY N/A 10/05/2021   Procedure: RIGHT/LEFT HEART CATH AND CORONARY ANGIOGRAPHY;  Surgeon: Larey Dresser, MD;  Location: [place] [facility];  Service: Cardiovascular;  Laterality: N/A;   TUBAL LIGATION     Social History   Social History  Narrative   Not on file   Immunization History  Administered Date(s) Administered   Influenza, Seasonal, Injecte, Preservative Fre 06/20/2014, 05/28/2015, 06/08/2016, 06/06/2019, 06/05/2020   Pneumococcal Polysaccharide-23 10/26/2016   Tdap 12/13/2014     Objective: Vital Signs: LMP  (LMP Unknown)    Physical Exam   Musculoskeletal Exam: ***  CDAI Exam: CDAI Score: -- Patient Global: --; Provider Global: -- Swollen: --; Tender: -- Joint Exam 12/22/2021   No joint exam has been documented for this visit   There is currently no information documented on the homunculus. Go to the Rheumatology activity and complete the homunculus joint exam.  Investigation: No additional findings.  Imaging: XR Hand 2 View Left  Result Date: 11/24/2021 CMC narrowing and subluxation was noted.  PIP and DIP narrowing was noted.  No MCP, intercarpal or radiocarpal joint space narrowing was noted.  No erosive changes were noted. Impression: These findings are consistent with osteoarthritis of the hand.  XR Hand 2 View Right  Result Date: 11/24/2021 Severe CMC narrowing and subluxation was noted.  PIP and DIP narrowing was noted.  No MCP, intercarpal or radiocarpal joint space narrowing was noted.  No erosive changes were noted. Impression: These findings are consistent with osteoarthritis of the hand.   Recent Labs: Lab Results  Component Value Date   WBC 6.2 10/09/2021   HGB 12.3 10/09/2021   PLT 160 10/09/2021   NA 142 12/18/2021   K 4.2 12/18/2021   CL 108 12/18/2021   CO2 28 12/18/2021   GLUCOSE 109 (H) 12/18/2021   BUN 13 12/18/2021  CREATININE 0.75 12/18/2021   BILITOT 0.4 10/05/2021   ALKPHOS 75 10/05/2021   AST 27 10/05/2021   ALT 18 10/05/2021   PROT 7.1 10/05/2021   ALBUMIN 3.9 10/05/2021   CALCIUM 8.9 12/18/2021   November 24, 2021 ANA negative, ENA negative, C3-C4 normal, RF negative, anti-CCP negative   Speciality Comments: No specialty comments  available.  Procedures:  No procedures performed Allergies: Penicillins   Assessment / Plan:     Visit Diagnoses: Primary osteoarthritis of both hands - Clinical and radiographic findings were consistent with severe osteoarthritis.  All autoimmune work-up negative.  Fibromyalgia - History of generalized pain, hyperalgesia.  She was diagnosed with fibromyalgia several years ago.  Pulmonary hypertension ([place]) - Followed by Dr. Aundra Dubin.  All autoimmune work-up negative.  DOE (dyspnea on exertion) - High-resolution CT pending.  History of COPD - Followed by Dr. [name].  She is former smoker.  Nonrheumatic mitral valve regurgitation  LBBB (left bundle branch block)  Chronic systolic heart failure ([place])  Thoracic aortic aneurysm without rupture, unspecified part ([place])  History of hyperlipidemia  Cerebral infarction due to embolism of right middle cerebral artery ([place]) - She had an episode in February 2023.  She is on Plavix.  History of osteoporosis - Followed by her PCP.  History of posttraumatic stress disorder (PTSD)  GAD (generalized anxiety disorder)  History of hypothyroidism  Former smoker - She smoked 1 pack/day for 20 years.  She quit smoking in 2003.  Orders: No orders of the defined types were placed in this encounter.  No orders of the defined types were placed in this encounter.   Face-to-face time spent with patient was *** minutes. Greater than 50% of time was spent in counseling and coordination of care.  Follow-Up Instructions: No follow-ups on file.   Bo Merino, MD  Note - This record has been created using Editor, commissioning.  Chart creation errors have been sought, but may not always  have been located. Such creation errors do not reflect on  the standard of medical care.

## 2021-12-17 ENCOUNTER — Telehealth (HOSPITAL_COMMUNITY): Payer: Self-pay

## 2021-12-17 NOTE — Progress Notes (Signed)
? ?ADVANCED HF CLINIC NOTE ? ? ?Primary Care: Vidal Schwalbe, MD ?HF Cardiologist: Dr. Aundra Dubin ? ?HPI: ?Stacey Hartman is a 66 y.o. with a history of hypothyroidism, former tobacco abuse (quit 20 years ago), COPD, and new  systolic heart failure with severely reduced EF.  ?  ?Father and brother died from heart attack.  ?  ?Saw PCP for increased shortness of breath. Echo 2/23 EF < 20% RV normal.  ? ?Sent to ED from cardiology office after echo showed EF < 20%. Had CP and SOB in waiting room/triage and taken for urgent R/LHC. Cardiac cath with no significant CAD, normal PA and PCWP, LVEDP 28 mmHg, CO preserved.  cMRI with LVEF 12%. No myocardial LGE. Elevated T1 indices, ECV 42% in basal septum. Developed acute left sided facial numbness during cMRI on 02/08. Code stroke called. MRI brain with punctate acute infarct in high right frontal cortex. No AF detected during admit. 30-day monitor arranged to evaluate for arrhythmias. Continued on ASA +Plavix x 3 weeks, then monotherapy, per Neuro recs. Diuresed with IV lasix and started on GDMT.  PYP obtained and equivocal for transthyretin amyloidosis. She was discharged home, weight 122 lbs. ? ?Today she returns for HF follow up. Overall feeling fine. She has dyspnea walking on flat ground for farther distances. She has occasional ankle swelling at night and palpitations. Denies abnormal bleeding, CP, dizziness,  or PND/Orthopnea. Appetite ok. No fever or chills. Weight at home 119-121 pounds. Taking all medications.  ? ?ECG (personally reviewed): none ordered today. ? ?ReDs: 26% ? ?Labs (2/23): K 3.9, creatinine 0.63, hgb 12.3 ? ?Past Medical History:  ?Diagnosis Date  ? Hypothyroid   ? ?Current Outpatient Medications  ?Medication Sig Dispense Refill  ? aspirin 81 MG chewable tablet Chew 1 tablet (81 mg total) by mouth daily. 30 tablet 5  ? atorvastatin (LIPITOR) 40 MG tablet Take 1 tablet (40 mg total) by mouth daily. 30 tablet 5  ? carvedilol (COREG) 3.125 MG tablet Take 1  tablet (3.125 mg total) by mouth 2 (two) times daily with a meal. 60 tablet 5  ? CLONAZEPAM PO Take 0.5 mg by mouth 2 (two) times daily.    ? clopidogrel (PLAVIX) 75 MG tablet Take 1 tablet (75 mg total) by mouth daily. 30 tablet 5  ? dapagliflozin propanediol (FARXIGA) 10 MG TABS tablet Take 1 tablet (10 mg total) by mouth daily. 30 tablet 5  ? gabapentin (NEURONTIN) 300 MG capsule Take 300 mg by mouth at bedtime.    ? levothyroxine (SYNTHROID) 150 MCG tablet Take 1 tablet (150 mcg total) by mouth daily before breakfast. 30 tablet 3  ? losartan (COZAAR) 25 MG tablet Take 0.5 tablets (12.5 mg total) by mouth at bedtime. 15 tablet 5  ? spironolactone (ALDACTONE) 25 MG tablet Take 1 tablet (25 mg total) by mouth daily. 30 tablet 5  ? ?No current facility-administered medications for this encounter.  ? ?Allergies  ?Allergen Reactions  ? Penicillins   ?  Trouble breathing   ? ?Social History  ? ?Socioeconomic History  ? Marital status: Unknown  ?  Spouse name: Not on file  ? Number of children: Not on file  ? Years of education: Not on file  ? Highest education level: Not on file  ?Occupational History  ? Not on file  ?Tobacco Use  ? Smoking status: Former  ?  Types: Cigarettes  ?  Passive exposure: Current  ? Smokeless tobacco: Never  ?Vaping Use  ? Vaping Use: Never  used  ?Substance and Sexual Activity  ? Alcohol use: Not Currently  ? Drug use: Never  ? Sexual activity: Not on file  ?Other Topics Concern  ? Not on file  ?Social History Narrative  ? Not on file  ? ?Social Determinants of Health  ? ?Financial Resource Strain: Not on file  ?Food Insecurity: Not on file  ?Transportation Needs: Not on file  ?Physical Activity: Not on file  ?Stress: Not on file  ?Social Connections: Not on file  ?Intimate Partner Violence: Not on file  ? ?Family History  ?Problem Relation Age of Onset  ? Melanoma Mother   ? Heart attack Father   ? Melanoma Sister   ? Melanoma Brother   ? Heart failure Brother   ? Healthy Son   ? ?BP (!)  92/50   Pulse 80   Wt 54 kg (119 lb)   LMP  (LMP Unknown)   SpO2 96%   BMI 21.08 kg/m?  ? ?Wt Readings from Last 3 Encounters:  ?12/18/21 54 kg (119 lb)  ?11/24/21 56.2 kg (123 lb 12.8 oz)  ?10/23/21 56.5 kg (124 lb 9.6 oz)  ? ?PHYSICAL EXAM: ?General:  NAD. No resp difficulty, thin ?HEENT: Normal ?Neck: Supple. No JVD. Carotids 2+ bilat; no bruits. No lymphadenopathy or thryomegaly appreciated. ?Cor: PMI nondisplaced. Regular rate & rhythm. No rubs, gallops or murmurs. ?Lungs: Clear ?Abdomen: Soft, nontender, nondistended. No hepatosplenomegaly. No bruits or masses. Good bowel sounds. ?Extremities: No cyanosis, clubbing, rash, edema ?Neuro: Alert & oriented x 3, cranial nerves grossly intact. Moves all 4 extremities w/o difficulty. Affect pleasant. ? ?ASSESSMENT & PLAN: ?1. Chronic systolic CHF: Nonischemic cardiomyopathy. Etiology not certain. No CAD on LHC. Has LBBB of uncertain duration.  She has a strong family history of CHF (father died at 86, brother with "CHF," nephew with "CHF" recently died as well). 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  Echo 2/23 showed EF < 20% with diffuse hypokinesis and normal RV.  No LV thrombus noted. R/LHC 02/23: No significant coronary disease, normal RA pressure and PCWP, LVEDP 28 mmHg, preserved CO. cMRI (2/23): No myocardial LGE. Elevated T1 indices, ECV was 42% in the basal septum. PYP not suggestive of TTR amyloidosis. Genetic testing has been arranged. Stable NYHA II today. She is not volume overloaded on exam, REDs 26%. I worry she may be slightly hypovolemic. GDMT limited by low BP. ?- Decrease spironolactone to 12.5 mg daily. BMET today. ?- Continue losartan 12.5 mg q hs. ?- Continue Coreg 3.125 mg bid. ?- Continue Farxiga 10 mg daily.  ?- Plan to repeat echo next appt. ?- She would be a candidate for LVAD in future if she deteriorates. ?2. CVA: Code Stroke during cMRI. Head CT negative, MRI brain + punctate acute infarct in the high right frontal cortex. CTA Head and  Neck no LVO. Suspect infarct due to probable cardio embolic and small vessel disease. No Afib detected during admit. 30 day monitor showed no Afib. ?- DAPT w/ ASA + Plavix x 3 weeks, then monotherapy per Neuro. ?- Continue atrorva 40 daily (LDL 89 mg/dL).  ?- She has not made post-hospital follow up appt, gave # to do so. ?3. Mitral valve regurgitation: Mild to moderate on recent echo. ?- Mild on limited echo. ?4. Hypothyroidism: Back on levothyroxine.  ?- Needs follow up with PCP.  ?5. COPD/prior tobacco use:  Says she quit smoking 20 years ago. Chest CT + for emphysema + 4 mm RUL Pulmonary nodule. ?- Need f/u scan in 12 months. ?-  She has follow up with Dr. [name] next month. ?6. Thoracic Aneurysm: Noted on chest CT, 4.0 cm, former smoker. ?- Needs annual f/u.  ?- BP control. Continue ? blocker. ?- Continue atorvastatin 40 mg.  ? ?Follow up Dr. Aundra Dubin + echo next month as scheduled. ?  ?Allena Katz, FNP-BC ?12/18/21 ?

## 2021-12-17 NOTE — Telephone Encounter (Signed)
Called to confirm/remind patient of their appointment at the [facility] on 12/17/21.  ? ?Patient reminded to bring all medications and/or complete list. ? ?Confirmed patient has transportation. Gave directions, instructed to utilize [facility] parking. ? ?Confirmed appointment prior to ending call.  ? ?

## 2021-12-18 ENCOUNTER — Encounter (HOSPITAL_COMMUNITY): Payer: Self-pay

## 2021-12-18 ENCOUNTER — Ambulatory Visit (HOSPITAL_COMMUNITY)
Admission: RE | Admit: 2021-12-18 | Discharge: 2021-12-18 | Disposition: A | Discharge status: Home / Self Care | Payer: 59 | Source: Ambulatory Visit | Attending: Family Medicine | Admitting: Family Medicine

## 2021-12-18 VITALS — BP 92/50 | HR 80 | Wt 119.0 lb

## 2021-12-18 DIAGNOSIS — E039 Hypothyroidism, unspecified: Secondary | ICD-10-CM | POA: Diagnosis not present

## 2021-12-18 DIAGNOSIS — Z7984 Long term (current) use of oral hypoglycemic drugs: Secondary | ICD-10-CM | POA: Insufficient documentation

## 2021-12-18 DIAGNOSIS — I5022 Chronic systolic (congestive) heart failure: Secondary | ICD-10-CM

## 2021-12-18 DIAGNOSIS — R911 Solitary pulmonary nodule: Secondary | ICD-10-CM | POA: Diagnosis not present

## 2021-12-18 DIAGNOSIS — I428 Other cardiomyopathies: Secondary | ICD-10-CM | POA: Diagnosis present

## 2021-12-18 DIAGNOSIS — Z87891 Personal history of nicotine dependence: Secondary | ICD-10-CM | POA: Insufficient documentation

## 2021-12-18 DIAGNOSIS — Z8249 Family history of ischemic heart disease and other diseases of the circulatory system: Secondary | ICD-10-CM | POA: Insufficient documentation

## 2021-12-18 DIAGNOSIS — Z79899 Other long term (current) drug therapy: Secondary | ICD-10-CM | POA: Insufficient documentation

## 2021-12-18 DIAGNOSIS — Z79890 Hormone replacement therapy (postmenopausal): Secondary | ICD-10-CM | POA: Diagnosis not present

## 2021-12-18 DIAGNOSIS — I69392 Facial weakness following cerebral infarction: Secondary | ICD-10-CM

## 2021-12-18 DIAGNOSIS — Z7982 Long term (current) use of aspirin: Secondary | ICD-10-CM | POA: Insufficient documentation

## 2021-12-18 DIAGNOSIS — I712 Thoracic aortic aneurysm, without rupture, unspecified: Secondary | ICD-10-CM | POA: Insufficient documentation

## 2021-12-18 DIAGNOSIS — Z7902 Long term (current) use of antithrombotics/antiplatelets: Secondary | ICD-10-CM | POA: Diagnosis not present

## 2021-12-18 DIAGNOSIS — I340 Nonrheumatic mitral (valve) insufficiency: Secondary | ICD-10-CM | POA: Insufficient documentation

## 2021-12-18 DIAGNOSIS — J439 Emphysema, unspecified: Secondary | ICD-10-CM | POA: Diagnosis not present

## 2021-12-18 DIAGNOSIS — I447 Left bundle-branch block, unspecified: Secondary | ICD-10-CM | POA: Insufficient documentation

## 2021-12-18 DIAGNOSIS — J449 Chronic obstructive pulmonary disease, unspecified: Secondary | ICD-10-CM

## 2021-12-18 LAB — BASIC METABOLIC PANEL
Anion gap: 6 (ref 5–15)
BUN: 13 mg/dL (ref 8–23)
CO2: 28 mmol/L (ref 22–32)
Calcium: 8.9 mg/dL (ref 8.9–10.3)
Chloride: 108 mmol/L (ref 98–111)
Creatinine, Ser: 0.75 mg/dL (ref 0.44–1.00)
GFR, Estimated: >60 mL/min (ref >60)
Glucose, Bld: 109 mg/dL — ABNORMAL HIGH (ref 70–99)
Potassium: 4.2 mmol/L (ref 3.5–5.1)
Sodium: 142 mmol/L (ref 135–145)

## 2021-12-18 MED ORDER — SPIRONOLACTONE 25 MG PO TABS: 12.5000 mg | ORAL_TABLET | Freq: Every day | ORAL | 3 refills | Status: DC

## 2021-12-18 NOTE — Patient Instructions (Signed)
Labs done today. We will contact you only if your labs are abnormal. ? ?DECREASE Spironolactone to 12.'5mg'$  (1/2 tablet) by mouth daily.  ? ?No other medication changes were made. Please continue all current medications as prescribed. ? ?Your physician recommends that you keep your scheduled follow-up appointment with Dr. Aundra Dubin. ? ?If you have any questions or concerns before your next appointment please send [place] a message through [place] or call our office at [phone].   ? ?TO LEAVE A MESSAGE FOR THE NURSE SELECT OPTION 2, PLEASE LEAVE A MESSAGE INCLUDING: ?YOUR NAME ?DATE OF BIRTH ?CALL BACK NUMBER ?REASON FOR CALL**this is important as we prioritize the call backs ? ?YOU WILL RECEIVE A CALL BACK THE SAME DAY AS LONG AS YOU CALL BEFORE 4:00 PM ? ? ?Do the following things EVERYDAY: ?Weigh yourself in the morning before breakfast. Write it down and keep it in a log. ?Take your medicines as prescribed ?Eat low salt foods--Limit salt (sodium) to 2000 mg per day.  ?Stay as active as you can everyday ?Limit all fluids for the day to less than 2 liters ? ? ?At the [facility], you and your health needs are our priority. As part of our continuing mission to provide you with exceptional heart care, we have created designated Provider Care Teams. These Care Teams include your primary Cardiologist (physician) and Advanced Practice Providers (APPs- Physician Assistants and Nurse Practitioners) who all work together to provide you with the care you need, when you need it.  ? ?You may see any of the following providers on your designated Care Team at your next follow up: ?Dr Glori Bickers ?Dr Loralie Champagne ?Darrick Grinder, NP ?Lyda Jester, PA ?Audry Riles, PharmD ? ? ?Please be sure to bring in all your medications bottles to every appointment.  ? ?

## 2021-12-18 NOTE — Progress Notes (Signed)
ReDS Vest / Clip - 12/18/21 1500   ? ?  ? ReDS Vest / Clip  ? Station Marker A   ? Ruler Value 27   ? ReDS Value Range Low volume   ? ReDS Actual Value 26   ? ?  ?  ? ?  ? ? ? ?

## 2021-12-21 ENCOUNTER — Ambulatory Visit (HOSPITAL_BASED_OUTPATIENT_CLINIC_OR_DEPARTMENT_OTHER): Admission: RE | Admit: 2021-12-21 | Payer: 59 | Source: Ambulatory Visit

## 2021-12-22 ENCOUNTER — Ambulatory Visit: Payer: 59 | Admitting: Rheumatology

## 2021-12-22 DIAGNOSIS — I63411 Cerebral infarction due to embolism of right middle cerebral artery: Secondary | ICD-10-CM

## 2021-12-22 DIAGNOSIS — F411 Generalized anxiety disorder: Secondary | ICD-10-CM

## 2021-12-22 DIAGNOSIS — Z8709 Personal history of other diseases of the respiratory system: Secondary | ICD-10-CM

## 2021-12-22 DIAGNOSIS — I340 Nonrheumatic mitral (valve) insufficiency: Secondary | ICD-10-CM

## 2021-12-22 DIAGNOSIS — I712 Thoracic aortic aneurysm, without rupture, unspecified: Secondary | ICD-10-CM

## 2021-12-22 DIAGNOSIS — I5022 Chronic systolic (congestive) heart failure: Secondary | ICD-10-CM

## 2021-12-22 DIAGNOSIS — Z8739 Personal history of other diseases of the musculoskeletal system and connective tissue: Secondary | ICD-10-CM

## 2021-12-22 DIAGNOSIS — M797 Fibromyalgia: Secondary | ICD-10-CM

## 2021-12-22 DIAGNOSIS — I272 Pulmonary hypertension, unspecified: Secondary | ICD-10-CM

## 2021-12-22 DIAGNOSIS — Z8639 Personal history of other endocrine, nutritional and metabolic disease: Secondary | ICD-10-CM

## 2021-12-22 DIAGNOSIS — Z8659 Personal history of other mental and behavioral disorders: Secondary | ICD-10-CM

## 2021-12-22 DIAGNOSIS — R0609 Other forms of dyspnea: Secondary | ICD-10-CM

## 2021-12-22 DIAGNOSIS — I447 Left bundle-branch block, unspecified: Secondary | ICD-10-CM

## 2021-12-22 DIAGNOSIS — M19041 Primary osteoarthritis, right hand: Secondary | ICD-10-CM

## 2021-12-22 DIAGNOSIS — Z87891 Personal history of nicotine dependence: Secondary | ICD-10-CM

## 2022-01-04 ENCOUNTER — Telehealth: Payer: Self-pay | Admitting: Adult Health

## 2022-01-04 NOTE — Telephone Encounter (Signed)
LVM and sent mychart msg informing pt of r/s needed for 5/16 appt- NP out. ?

## 2022-01-06 ENCOUNTER — Ambulatory Visit (INDEPENDENT_AMBULATORY_CARE_PROVIDER_SITE_OTHER): Payer: 59 | Admitting: Internal Medicine

## 2022-01-06 ENCOUNTER — Encounter: Payer: Self-pay | Admitting: Internal Medicine

## 2022-01-06 DIAGNOSIS — R0609 Other forms of dyspnea: Secondary | ICD-10-CM

## 2022-01-06 NOTE — Progress Notes (Signed)
? ?Stacey Hartman, female    DOB: Jan 10, 1956,    MRN: [id number] ? ? ?Brief patient profile:  ?65   yowf quit smoking  around 2002 with cough/sob completely improved to point where no symptoms including walking up to several miles including hills but dx fibromyalgia since 2012  then August 2021 salmonella/rx as out pt with "pcn" then severe skin peeling lost 22 lb and persistent diarrhea/sob/ cough referred to pulmonary clinic in [facility]  08/25/2021 by [name] already under care of skin doctor in [place], GI doctor in [place], rheumatology eval pending.  ? ? ? ? ?History of Present Illness  ?08/25/2021  Pulmonary/ 1st office eval/ [name] / [name]  ?Chief Complaint  ?Patient presents with  ? Consult  ?  Hx of COPD. After penicillin has noticed struggle with breathing since then (03/2020).  ? ?Coughs up green mucus   ? Dyspnea:  getting worse to point of 100 ft doe ?Cough: congested cough worse when lies down  ?Sleep: bed is flat with 3 pillows  ?SABA use: spiriva and albuterol helped / has not tried pred for this illness but took it previously for "bronchitis" and seemed to help ?Omeprazole bid per GI  ?Rec ?Stiolto one puff a day x one week and if not better start on 2 puffs each am  ?Omeprazole 20 mg Take 30- 60 min before your first and last meals of the day  ?GERD diet reviewed, bed blocks rec  ?Please remember to go to the lab department    > BNP 442 > echo next :   ?EF < 20% admitted ?If you do have ulcerative colitis it may be affecting your airways - discuss with your GI doctor  ? ?  ? ? ?01/06/2022  f/u ov/[place] office/Jhordan Mckibben re: doe maint on stiolto  / followed now in chf clinic ?Chief Complaint  ?Patient presents with  ? Follow-up  ?  Coughing and breathing has improved.  ?Has had cardiac issues since last visit.   ?Dyspnea:  still only able to walk about 100 ft flat surfaces slow pace = MMRC3 = can't walk 100 yards even at a slow pace at a flat grade s stopping due to sob   ?Cough: something   p supper better at hs  ?Sleeping: about 30 degrees hosp bed  ?SABA use: has neb not using  ?02: none  ?Covid status: vax x 2  ?  ? ?No obvious day to day or daytime variability or assoc excess/ purulent sputum or mucus plugs or hemoptysis or cp or chest tightness, subjective wheeze or overt sinus or hb symptoms.  ? ?Sleeping as above without nocturnal  or early am exacerbation  of respiratory  c/o's or need for noct saba. Also denies any obvious fluctuation of symptoms with weather or environmental changes or other aggravating or alleviating factors except as outlined above  ? ?No unusual exposure hx or h/o childhood pna/ asthma or knowledge of premature birth. ? ?Current Allergies, Complete Past Medical History, Past Surgical History, Family History, and Social History were reviewed in [employer] record. ? ?ROS  The following are not active complaints unless bolded ?Hoarseness, sore throat, dysphagia, dental problems, itching, sneezing,  nasal congestion or discharge of excess mucus or purulent secretions, ear ache,   fever, chills, sweats, unintended wt loss or wt gain, classically pleuritic or exertional cp,  orthopnea pnd or arm/hand swelling  or leg swelling, presyncope, palpitations, abdominal pain, anorexia, nausea, vomiting, diarrhea  or change  in bowel habits or change in bladder habits, change in stools or change in urine, dysuria, hematuria,  rash, arthralgias, visual complaints, headache, numbness, weakness or ataxia or problems with walking or coordination,  change in mood or  memory. ?      ? ?Current Meds  ?Medication Sig  ? aspirin 81 MG chewable tablet Chew 1 tablet (81 mg total) by mouth daily.  ? atorvastatin (LIPITOR) 40 MG tablet Take 1 tablet (40 mg total) by mouth daily.  ? carvedilol (COREG) 3.125 MG tablet Take 1 tablet (3.125 mg total) by mouth 2 (two) times daily with a meal.  ? CLONAZEPAM PO Take 0.5 mg by mouth 2 (two) times daily.  ? clopidogrel (PLAVIX) 75 MG  tablet Take 1 tablet (75 mg total) by mouth daily.  ? dapagliflozin propanediol (FARXIGA) 10 MG TABS tablet Take 1 tablet (10 mg total) by mouth daily.  ? gabapentin (NEURONTIN) 300 MG capsule Take 300 mg by mouth at bedtime.  ? levothyroxine (SYNTHROID) 150 MCG tablet Take 1 tablet (150 mcg total) by mouth daily before breakfast.  ? losartan (COZAAR) 25 MG tablet Take 0.5 tablets (12.5 mg total) by mouth at bedtime.  ? spironolactone (ALDACTONE) 25 MG tablet Take 0.5 tablets (12.5 mg total) by mouth daily.  ?     ?  ?    ? ? ?Objective:  ?  ? ?Wt Readings from Last 3 Encounters:  ?01/06/22 118 lb 6.4 oz (53.7 kg)  ?12/18/21 119 lb (54 kg)  ?11/24/21 123 lb 12.8 oz (56.2 kg)  ?  ? ? ?Vital signs reviewed  01/06/2022  - Note at rest 02 sats  97% on RA  ? ?General appearance:    amb somber wf nad  ? ? HEENT : nl exam  ? ? ?NECK :  without JVD/Nodes/TM/ nl carotid upstrokes bilaterally ? ? ?LUNGS: no acc muscle use,  Nl contour chest with min insp crackles in bases bilaterally without cough on insp or exp maneuvers ? ? ?CV:  RRR   POS S3 gallop / 2/6 slt   increase in P2, and no edema  ? ?ABD:  soft and nontender with nl inspiratory excursion in the supine position. No bruits or organomegaly appreciated, bowel sounds nl ? ?MS:  Nl gait/ ext warm without deformities, calf tenderness, cyanosis or clubbing ?No obvious joint restrictions  ? ?SKIN: warm and dry without lesions   ? ?NEURO:  alert, approp, nl sensorium with  no motor or cerebellar deficits apparent.  ?  ? ?  ? ?  ?    ? ? ?I personally reviewed images and agree with radiology impression as follows:  ?- Chest CTa  10/06/21 ?No evidence of pulmonary embolism. ?4 mm RIGHT upper lobe nodule  ?Enlargement of cardiac chambers especially LEFT ventricle. ?Aortic Atherosclerosis (ICD10-I70.0) ?Emphysema (ICD10-J43.9). ?  ? ? ?   ?Assessment  ? ?  ?  ?  ? ?  ? ?  ? ?  ?   ?

## 2022-01-06 NOTE — Patient Instructions (Addendum)
We will check your 0xygen level today walking ? ?Only use your albuterol-ipatropium nebulizer as a rescue medication to be used if you can't catch your breath by resting   ?- The less you use it, the better it will work when you need it. ?- Ok to use up to every 4 hours if you must but call for immediate appointment if use goes up over your usual need ?- Don't leave home without it !!  (think of it like the spare tire for your car)  ? ?Ok to leave off stiolto  ? ?Pulmonary follow up is as needed  ?

## 2022-01-07 ENCOUNTER — Encounter: Payer: Self-pay | Admitting: Internal Medicine

## 2022-01-07 NOTE — Assessment & Plan Note (Signed)
Onset was August 2021 s/p quit smoking 2003 s residual doe ?- assoc with chronic diarrhea at wt loss ? Inflammatory bowel dz?  ?- 08/25/2021   Walked on RA  x  3  lap(s) =  approx 450  ft  @ mod to fast pace, stopped due to end of study, sob p 1st lap with lowest 02 sats 97% ?- 08/25/2021  After extensive coaching inhaler device,  effectiveness =    75% with respimat > try stiolto 1 qd x one week then 2 qd p that if tolerates/effective   ?- Labs ordered 08/25/21   :  allergy profile  Eos 0.3  / IgE 46    alpha one AT phenotype  MM  Level 146 ?- 10/01/2021 echo  1. Left ventricular ejection fraction, by estimation, is <20%. The left ventricle has severely decreased function. The left ventricle demonstrates global hypokinesis. The left ventricular internal cavity size was moderately dilated. Left ventricular  ?diastolic parameters are consistent with Grade I diastolic dysfunction (impaired relaxation). ? 2. Right ventricular systolic function is normal. The right ventricular size is normal. There is mildly elevated pulmonary artery systolic pressure. The estimated right ventricular systolic pressure is 16.1 mmHg. ? 3. Left atrial size was moderately dilated. ? 4. Right atrial size was moderately dilated. ? 5. The mitral valve is degenerative. Mild to moderate mitral valve regurgitation. No evidence of mitral stenosis. ? 6. The aortic valve is normal in structure. Aortic valve regurgitation is not visualized. No aortic stenosis is present. ? 7. The inferior vena cava is normal in size with greater than 50% respiratory variability, suggesting right atrial pressure of 3 mmHg> referred to cardiology  ?-  PFT's  12/08/21  FEV1 2.27 (97 % ) ratio 0.82  p 0 % improvement from saba p 0  prior to study with DLCO  19.12 (87%)   FV curve min concavity at low volumes   ?01/06/2022   Walked on RA  x  3  lap(s) =  approx 450  ft  @ moderate pace, stopped due to end of study with lowest 02 sats 94% and no reported sob  ? ? ?Despite  emphysema on ct she does not functionally fit the criteria for copd and likely had "cardiac asthma" that improved with use of LAMA but probably is not needed long term. ? ?rec  ?Try off stiolto and just use prn saba/sama per neb  c/w Group A  Copd pt  ? ?If worse ok to restart stiolto  ? ?Each maintenance medication was reviewed in detail including emphasizing most importantly the difference between maintenance and prns and under what circumstances the prns are to be triggered using an action plan format where appropriate. ? ?Total time for H and P, chart review, counseling, reviewing smi/neb device(s) , directly observing portions of ambulatory 02 saturation study/ and generating customized AVS unique to this office visit / same day charting  > 30 min final summary ov ?     ?  ?      ? ? ?

## 2022-01-12 ENCOUNTER — Inpatient Hospital Stay: Payer: 59 | Admitting: Adult Health

## 2022-01-26 ENCOUNTER — Ambulatory Visit (HOSPITAL_BASED_OUTPATIENT_CLINIC_OR_DEPARTMENT_OTHER)
Admission: RE | Admit: 2022-01-26 | Discharge: 2022-01-26 | Disposition: A | Discharge status: Home / Self Care | Payer: 59 | Source: Ambulatory Visit | Attending: Cardiology | Admitting: Cardiology

## 2022-01-26 ENCOUNTER — Encounter (HOSPITAL_COMMUNITY): Payer: Self-pay | Admitting: Cardiology

## 2022-01-26 ENCOUNTER — Ambulatory Visit (HOSPITAL_COMMUNITY)
Admission: RE | Admit: 2022-01-26 | Discharge: 2022-01-26 | Disposition: A | Discharge status: Home / Self Care | Payer: 59 | Source: Ambulatory Visit | Attending: Emergency Medicine | Admitting: Emergency Medicine

## 2022-01-26 VITALS — BP 94/50 | HR 77 | Wt 118.0 lb

## 2022-01-26 DIAGNOSIS — Z79899 Other long term (current) drug therapy: Secondary | ICD-10-CM | POA: Insufficient documentation

## 2022-01-26 DIAGNOSIS — Z8249 Family history of ischemic heart disease and other diseases of the circulatory system: Secondary | ICD-10-CM | POA: Diagnosis not present

## 2022-01-26 DIAGNOSIS — Z7902 Long term (current) use of antithrombotics/antiplatelets: Secondary | ICD-10-CM | POA: Insufficient documentation

## 2022-01-26 DIAGNOSIS — E782 Mixed hyperlipidemia: Secondary | ICD-10-CM | POA: Diagnosis not present

## 2022-01-26 DIAGNOSIS — E039 Hypothyroidism, unspecified: Secondary | ICD-10-CM | POA: Insufficient documentation

## 2022-01-26 DIAGNOSIS — I959 Hypotension, unspecified: Secondary | ICD-10-CM | POA: Insufficient documentation

## 2022-01-26 DIAGNOSIS — I5022 Chronic systolic (congestive) heart failure: Secondary | ICD-10-CM | POA: Diagnosis present

## 2022-01-26 DIAGNOSIS — I428 Other cardiomyopathies: Secondary | ICD-10-CM | POA: Diagnosis not present

## 2022-01-26 DIAGNOSIS — Z87891 Personal history of nicotine dependence: Secondary | ICD-10-CM | POA: Diagnosis not present

## 2022-01-26 DIAGNOSIS — Z8673 Personal history of transient ischemic attack (TIA), and cerebral infarction without residual deficits: Secondary | ICD-10-CM | POA: Diagnosis not present

## 2022-01-26 DIAGNOSIS — I447 Left bundle-branch block, unspecified: Secondary | ICD-10-CM | POA: Insufficient documentation

## 2022-01-26 DIAGNOSIS — J439 Emphysema, unspecified: Secondary | ICD-10-CM | POA: Insufficient documentation

## 2022-01-26 DIAGNOSIS — E785 Hyperlipidemia, unspecified: Secondary | ICD-10-CM | POA: Insufficient documentation

## 2022-01-26 LAB — ECHOCARDIOGRAM COMPLETE
Area-P 1/2: 4.8 cm2
Calc EF: 22.1 %
S' Lateral: 5.9 cm
Single Plane A2C EF: 21.1 %
Single Plane A4C EF: 21.8 %

## 2022-01-26 LAB — LIPID PANEL
Cholesterol: 114 mg/dL (ref 0–200)
HDL: 47 mg/dL (ref >40)
LDL Cholesterol: 48 mg/dL (ref 0–99)
Total CHOL/HDL Ratio: 2.4 RATIO
Triglycerides: 93 mg/dL (ref <150)
VLDL: 19 mg/dL (ref 0–40)

## 2022-01-26 LAB — BRAIN NATRIURETIC PEPTIDE: B Natriuretic Peptide: 379.1 pg/mL — ABNORMAL HIGH (ref 0.0–100.0)

## 2022-01-26 LAB — BASIC METABOLIC PANEL
Anion gap: 6 (ref 5–15)
BUN: 18 mg/dL (ref 8–23)
CO2: 26 mmol/L (ref 22–32)
Calcium: 9 mg/dL (ref 8.9–10.3)
Chloride: 106 mmol/L (ref 98–111)
Creatinine, Ser: 0.75 mg/dL (ref 0.44–1.00)
GFR, Estimated: >60 mL/min (ref >60)
Glucose, Bld: 116 mg/dL — ABNORMAL HIGH (ref 70–99)
Potassium: 3.6 mmol/L (ref 3.5–5.1)
Sodium: 138 mmol/L (ref 135–145)

## 2022-01-26 MED ORDER — FUROSEMIDE 20 MG PO TABS: 20.0000 mg | ORAL_TABLET | Freq: Every day | ORAL | 3 refills | Status: DC

## 2022-01-26 NOTE — Progress Notes (Signed)
ADVANCED HF CLINIC NOTE   Primary Care: Vidal Schwalbe, MD HF Cardiologist: Dr. Aundra Dubin  HPI: Stacey Hartman is a 66 y.o. with a history of hypothyroidism, former tobacco abuse (quit 20 years ago), COPD, and systolic heart failure with severely reduced EF.    Father with "MI," nephew died with heart failure at 70 yrs old.    Saw PCP for increased shortness of breath. Echo 2/23 EF < 20% RV normal.  Sent to ED from cardiology office after echo showed EF < 20%. Had CP and SOB in waiting room/triage and taken for urgent R/LHC. Cardiac cath with no significant CAD, normal PA and PCWP, LVEDP 28 mmHg, CO preserved.  cMRI with LVEF 12%, no myocardial LGE, elevated T1 indices, ECV 42% in basal septum. Developed acute left sided facial numbness during cMRI. Code stroke called. MRI brain with punctate acute infarct in high right frontal cortex. No AF detected during admit. 30-day monitor arranged to evaluate for arrhythmias, showed no AF either. Diuresed with IV lasix and started on GDMT.  PYP obtained and equivocal for transthyretin amyloidosis (grade 1, H/CL 1.47). She was discharged home, weight 122 lbs.  Echo was done today and reviewed, EF remains < 20%, moderate LV dilation, normal RV, IVC normal.    Patient returns for followup of CHF.  She feels generally weak. She is not short of breath walking around the house.  She has mild orthopnea.  She is able to walk to her neighbor's house with mild dyspnea.  Occasional lightheadedness with standing but no falls, syncope.  No chest pain.   Labs (2/23): K 3.9, creatinine 0.63, hgb 12.3 Labs (4/23): K 4.2, creatinine 0.75  ECG (personally reviewed): NSR, LBBB 176 msec  PMH: 1. Hypothyroidism 2. 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 Fibromyalgia 4. Raynaud's phenomenon 5. Chronic systolic CHF: Nonischemic cardiomyopathy.  - Echo (2/23): EF < 20%, RV normal.  - LHC/RHC (2/23): mean RA 2, PA  20/12 mean 13, mean PCWP 7, CI 2.2 Fick/2.79 thermo; no significant CAD.  - Cardiac MRI (2/23): LV EF 12%. No myocardial LGE. Elevated T1 indices, ECV was 42% in the basal septum.  - PYP (2/23): Grade 1, H/CL 1.47 (equivocal); TTR gene testing negative.  - Echo (5/23): EF < 20%, moderate LV dilation, normal RV, IVC normal.  6. CVA: 2/23.  - Zio monitor x 1 month showed no AF.   Current Outpatient Medications  Medication Sig Dispense Refill   aspirin 81 MG chewable tablet Chew 1 tablet (81 mg total) by mouth daily. 30 tablet 5   atorvastatin (LIPITOR) 40 MG tablet Take 1 tablet (40 mg total) by mouth daily. 30 tablet 5   carvedilol (COREG) 3.125 MG tablet Take 1 tablet (3.125 mg total) by mouth 2 (two) times daily with a meal. 60 tablet 5   CLONAZEPAM PO Take 0.5 mg by mouth 2 (two) times daily.     clopidogrel (PLAVIX) 75 MG tablet Take 1 tablet (75 mg total) by mouth daily. 30 tablet 5   dapagliflozin propanediol (FARXIGA) 10 MG TABS tablet Take 1 tablet (10 mg total) by mouth daily. 30 tablet 5   furosemide (LASIX) 20 MG tablet Take 1 tablet (20 mg total) by mouth daily. 90 tablet 3   gabapentin (NEURONTIN) 300 MG capsule Take 300 mg by mouth at bedtime.     levothyroxine (SYNTHROID) 150 MCG tablet Take 1 tablet (150 mcg total) by mouth daily  before breakfast. 30 tablet 3   losartan (COZAAR) 25 MG tablet Take 0.5 tablets (12.5 mg total) by mouth at bedtime. 15 tablet 5   spironolactone (ALDACTONE) 25 MG tablet Take 0.5 tablets (12.5 mg total) by mouth daily. 45 tablet 3   No current facility-administered medications for this encounter.   Allergies  Allergen Reactions   Penicillins     Trouble breathing    Social History   Socioeconomic History   Marital status: Unknown    Spouse name: Not on file   Number of children: Not on file   Years of education: Not on file   Highest education level: Not on file  Occupational History   Not on file  Tobacco Use   Smoking status: Former     Types: Cigarettes    Passive exposure: Current   Smokeless tobacco: Never  Vaping Use   Vaping Use: Never used  Substance and Sexual Activity   Alcohol use: Not Currently   Drug use: Never   Sexual activity: Not on file  Other Topics Concern   Not on file  Social History Narrative   Not on file   Social Determinants of Health   Financial Resource Strain: Not on file  Food Insecurity: Not on file  Transportation Needs: Not on file  Physical Activity: Not on file  Stress: Not on file  Social Connections: Not on file  Intimate Partner Violence: Not on file   Family History  Problem Relation Age of Onset   Melanoma Mother    Heart attack Father    Melanoma Sister    Melanoma Brother    Heart failure Brother    Healthy Son    BP (!) 94/50   Pulse 77   Wt 53.5 kg (118 lb)   LMP  (LMP Unknown)   SpO2 96%   BMI 20.90 kg/m   Wt Readings from Last 3 Encounters:  01/26/22 53.5 kg (118 lb)  01/06/22 53.7 kg (118 lb 6.4 oz)  12/18/21 54 kg (119 lb)   General: NAD Neck: JVP 8-9 cm, no thyromegaly or thyroid nodule.  Lungs: Clear to auscultation bilaterally with normal respiratory effort. CV: Nondisplaced PMI.  Heart regular S1/S2, no S3/S4, no murmur.  No peripheral edema.  No carotid bruit.  Normal pedal pulses.  Abdomen: Soft, nontender, no hepatosplenomegaly, no distention.  Skin: Intact without lesions or rashes.  Neurologic: Alert and oriented x 3.  Psych: Normal affect. Extremities: No clubbing or cyanosis.  HEENT: Normal.   ASSESSMENT & PLAN: 1. Chronic systolic CHF: Nonischemic cardiomyopathy. Etiology not certain. No CAD on LHC. Has LBBB of uncertain duration.  She has a strong family history of CHF (father died at 20 with "MI", brother with "CHF," nephew with "CHF" died at 26). 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  Echo 2/23 showed EF < 20% with diffuse hypokinesis and normal RV.  No LV thrombus noted. R/LHC in 2/23 showed no significant coronary disease, normal  RA pressure and PCWP, LVEDP 28 mmHg, preserved CO. cMRI (2/23) showed no myocardial LGE, ECV 42% in the basal septum. PYP not suggestive of TTR amyloidosis (at most equivocal) and genetic testing for TTR mutation was negative. Echo was done today and reviewed, EF remains < 20% with relatively normal RV.  She is mildly volume overloaded on exam with NYHA class III symptoms. Med titration limited by low BP.  - Continue spironolactone 12.5 mg daily.  - Continue losartan 12.5 mg q hs. - Continue Coreg 3.125 mg bid. - Continue  Farxiga 10 mg daily.  - Start Lasix 20 mg daily.  BMET today and in 10 days.  - Persistently low EF with wide LBBB, I will refer to EP for CRT-D.  - I will send [facility] cardiomyopathy gene testing given possible familial cardiomyopathy.  - CPX after CRT - She would be a candidate for LVAD in future if she deteriorates. 2. CVA: Code Stroke during cMRI. Head CT negative, MRI brain + punctate acute infarct in the high right frontal cortex. CTA Head and Neck no LVO. Suspect infarct due to probable cardio embolic and small vessel disease. No Afib detected during admit. 30 day monitor in 3/23 showed no Afib. - She can stop ASA and continue Plavix.  - Continue atorvastatin, check lipids today.   3. COPD/prior tobacco use:  Chest CT 2/23 showed emphysema + 4 mm RUL Pulmonary nodule.  However, PFTs in 4/23 showed only minimal obstruction.  - Need f/u CT scan in 12 months (nodule). - She sees pulmonary.  Followup APP 6 weeks.    Loralie Champagne 01/26/22

## 2022-01-26 NOTE — Progress Notes (Signed)
  Echocardiogram 2D Echocardiogram has been performed.  Stacey Hartman 01/26/2022, 1:58 PM

## 2022-01-26 NOTE — Progress Notes (Signed)
Blood collected for TTR genetic testing per Dr McLean.  Order form completed and both shipped by [employer] to [facility].  

## 2022-01-26 NOTE — Patient Instructions (Signed)
Medication Changes:  Start Lasix (Furosemide) '20mg'$  Daily   Lab Work:  Labs done today, your results will be available in MyChart, we will contact you for abnormal readings.   Testing/Procedures:  Genetic test has been done, this has to be sent to [place] to be processed and can take 1-2 weeks to get results back.  We will let you know the results.  Repeat  blood work in 10 days    Referrals:  You have been referred to Electrophysiologist. They will call you to arrange your appointment.    Special Instructions // Education:  none  Follow-Up in: 6 weeks   At the [facility], you and your health needs are our priority. We have a designated team specialized in the treatment of Heart Failure. This Care Team includes your primary Heart Failure Specialized Cardiologist (physician), Advanced Practice Providers (APPs- Physician Assistants and Nurse Practitioners), and Pharmacist who all work together to provide you with the care you need, when you need it.   You may see any of the following providers on your designated Care Team at your next follow up:  Dr Glori Bickers Dr Haynes Kerns, NP Lyda Jester, [place] [facility] [place], [place] Audry Riles, PharmD   Please be sure to bring in all your medications bottles to every appointment.   Need to Contact us:  If you have any questions or concerns before your next appointment please send [place] a message through [place] or call our office at [phone].    TO LEAVE A MESSAGE FOR THE NURSE SELECT OPTION 2, PLEASE LEAVE A MESSAGE INCLUDING: YOUR NAME DATE OF BIRTH CALL BACK NUMBER REASON FOR CALL**this is important as we prioritize the call backs  YOU WILL RECEIVE A CALL BACK THE SAME DAY AS LONG AS YOU CALL BEFORE 4:00 PM none

## 2022-02-05 ENCOUNTER — Other Ambulatory Visit (HOSPITAL_COMMUNITY): Payer: 59

## 2022-02-09 ENCOUNTER — Ambulatory Visit (HOSPITAL_COMMUNITY)
Admission: RE | Admit: 2022-02-09 | Discharge: 2022-02-09 | Disposition: A | Discharge status: Home / Self Care | Payer: 59 | Source: Ambulatory Visit | Attending: Internal Medicine | Admitting: Internal Medicine

## 2022-02-09 DIAGNOSIS — I5022 Chronic systolic (congestive) heart failure: Secondary | ICD-10-CM | POA: Insufficient documentation

## 2022-02-09 LAB — BASIC METABOLIC PANEL
Anion gap: 5 (ref 5–15)
BUN: 15 mg/dL (ref 8–23)
CO2: 29 mmol/L (ref 22–32)
Calcium: 9.3 mg/dL (ref 8.9–10.3)
Chloride: 107 mmol/L (ref 98–111)
Creatinine, Ser: 0.76 mg/dL (ref 0.44–1.00)
GFR, Estimated: >60 mL/min (ref >60)
Glucose, Bld: 106 mg/dL — ABNORMAL HIGH (ref 70–99)
Potassium: 4 mmol/L (ref 3.5–5.1)
Sodium: 141 mmol/L (ref 135–145)

## 2022-02-12 ENCOUNTER — Other Ambulatory Visit (HOSPITAL_COMMUNITY): Payer: Self-pay | Admitting: Cardiology

## 2022-02-17 ENCOUNTER — Inpatient Hospital Stay: Payer: 59 | Admitting: Adult Health

## 2022-02-24 ENCOUNTER — Other Ambulatory Visit (HOSPITAL_COMMUNITY): Payer: Self-pay | Admitting: Cardiology

## 2022-03-01 DIAGNOSIS — I428 Other cardiomyopathies: Secondary | ICD-10-CM | POA: Insufficient documentation

## 2022-03-01 DIAGNOSIS — I5022 Chronic systolic (congestive) heart failure: Secondary | ICD-10-CM | POA: Insufficient documentation

## 2022-03-03 ENCOUNTER — Encounter: Payer: Self-pay | Admitting: Internal Medicine

## 2022-03-03 ENCOUNTER — Ambulatory Visit (INDEPENDENT_AMBULATORY_CARE_PROVIDER_SITE_OTHER): Payer: 59 | Admitting: Internal Medicine

## 2022-03-03 ENCOUNTER — Telehealth: Payer: Self-pay | Admitting: Internal Medicine

## 2022-03-03 VITALS — BP 96/66 | HR 78 | Ht 63.0 in | Wt 116.0 lb

## 2022-03-03 DIAGNOSIS — I5022 Chronic systolic (congestive) heart failure: Secondary | ICD-10-CM | POA: Diagnosis not present

## 2022-03-03 DIAGNOSIS — I428 Other cardiomyopathies: Secondary | ICD-10-CM

## 2022-03-03 DIAGNOSIS — R634 Abnormal weight loss: Secondary | ICD-10-CM

## 2022-03-03 NOTE — Telephone Encounter (Signed)
Dr Caryl Comes has contacted pt and left a message advising she should not need medication to help with sedation for testing.

## 2022-03-03 NOTE — Progress Notes (Signed)
ELECTROPHYSIOLOGY CONSULT NOTE  Patient ID: Stacey Hartman, MRN: [id number], DOB/AGE: Jun 03, 1956 66 y.o. Admit date: (Not on file) Date of Consult: 03/03/2022  Primary Physician: Vidal Schwalbe, MD Primary Cardiologist: DM     Stacey Hartman is a 66 y.o. female who is being seen today for the evaluation of ICD at the request of DM.    HPI Stacey Hartman is a 66 y.o. female referred for consideration of an ICD.  She presented 2/23 with shortness of breath and a history of edema that had been preceding for about 6-12 months.  Her shortness of breath had been rather sudden in onset not withstanding, and had been accompanied by nocturnal dyspnea and orthopnea.  Hospitalization was complicated by acute facial numbness and MRI demonstrated a punctate acute infarct in the right frontal cortex.  No atrial fibrillation has been detected during the hospitalization or on subsequent 30-day monitoring.  She underwent diuresis.  She continues to feel terrible.  She is got fatigue.  Dyspnea.  Still on 2 pillows.  She eats well but continues to lose weight.   DATE TEST EF   2/23 Echo   <20 %   2/23 LHC   % No obstructive CAD  2/23 cMRI  12% No LGE  2/23 PYP  Non diagnostic  5/23 Echo  <20%    Date Cr K Hgb  6/23 0.76 4.0 12.3            Past Medical History:  Diagnosis Date   Chronic systolic CHF (congestive heart failure), NYHA class 3 (HCC)    Hypothyroid    LBBB (left bundle branch block)    NICM (nonischemic cardiomyopathy) ([place])    Weight loss, non-intentional       Surgical History:  Past Surgical History:  Procedure Laterality Date   ANKLE SURGERY Right    CHOLECYSTECTOMY     RIGHT/LEFT HEART CATH AND CORONARY ANGIOGRAPHY N/A 10/05/2021   Procedure: RIGHT/LEFT HEART CATH AND CORONARY ANGIOGRAPHY;  Surgeon: Larey Dresser, MD;  Location: [facility];  Service: Cardiovascular;  Laterality: N/A;   TUBAL LIGATION       Home Meds: Current Meds  Medication  Sig   atorvastatin (LIPITOR) 40 MG tablet Take 1 tablet (40 mg total) by mouth daily.   carvedilol (COREG) 3.125 MG tablet Take 1 tablet (3.125 mg total) by mouth 2 (two) times daily with a meal.   CLONAZEPAM PO Take 0.5 mg by mouth 2 (two) times daily.   clopidogrel (PLAVIX) 75 MG tablet Take 1 tablet (75 mg total) by mouth daily.   dapagliflozin propanediol (FARXIGA) 10 MG TABS tablet Take 1 tablet (10 mg total) by mouth daily.   furosemide (LASIX) 20 MG tablet Take 1 tablet (20 mg total) by mouth daily.   gabapentin (NEURONTIN) 300 MG capsule Take 300 mg by mouth at bedtime.   levothyroxine (SYNTHROID) 150 MCG tablet TAKE (1) TABLET BY MOUTH DAILY BEFORE BREAKFAST.   losartan (COZAAR) 25 MG tablet Take 0.5 tablets (12.5 mg total) by mouth at bedtime.   spironolactone (ALDACTONE) 25 MG tablet Take 0.5 tablets (12.5 mg total) by mouth daily.    Allergies:  Allergies  Allergen Reactions   Penicillins     Trouble breathing     Social History   Socioeconomic History   Marital status: Unknown    Spouse name: Not on file   Number of children: Not on file   Years of education: Not on file  Highest education level: Not on file  Occupational History   Not on file  Tobacco Use   Smoking status: Former    Types: Cigarettes    Passive exposure: Current   Smokeless tobacco: Never  Vaping Use   Vaping Use: Never used  Substance and Sexual Activity   Alcohol use: Not Currently   Drug use: Never   Sexual activity: Not on file  Other Topics Concern   Not on file  Social History Narrative   Not on file   Social Determinants of Health   Financial Resource Strain: Not on file  Food Insecurity: Not on file  Transportation Needs: Not on file  Physical Activity: Not on file  Stress: Not on file  Social Connections: Not on file  Intimate Partner Violence: Not on file     Family History  Problem Relation Age of Onset   Melanoma Mother    Heart attack Father    Melanoma Sister     Melanoma Brother    Heart failure Brother    Healthy Son      ROS:  Please see the history of present illness.     All other systems reviewed and negative.    Physical Exam: Blood pressure 96/66, pulse 78, height '5\' 3"'$  (1.6 m), weight 116 lb (52.6 kg), SpO2 97 %. General: thin and cachectic  female in no acute distress. Head: Normocephalic, atraumatic, sclera non-icteric, no xanthomas, nares are without discharge. EENT: normal  Lymph Nodes:  none Neck: Negative for carotid bruits. JVD not elevated. Back:without scoliosis kyphosis Lungs: Clear bilaterally to auscultation without wheezes, rales, or rhonchi. Breathing is unlabored. Heart: RRR with S1 S2. No  murmur . No rubs, or gallops appreciated. Abdomen: Soft, non-tender, non-distended with normoactive bowel sounds. No hepatomegaly. No rebound/guarding. No obvious abdominal masses. Msk:  Strength and tone appear normal for age. Extremities: No clubbing or cyanosis. No edema.  Distal pedal pulses are 2+ and equal bilaterally. Skin: Warm and Dry Neuro: Alert and oriented X 3. CN III-XII intact Grossly normal sensory and motor function . Psych:  Responds to questions appropriately with a normal affect.        EKG: sinus @ 78 15/18/48   Assessment and Plan:  Nonischemic cardiomyopathy  Left bundle branch block  Congestive heart failure-class IIIb  Weight loss-progressive with another 5% over the last 3 months despite appetite    The patient has persistent fatigue and exercise intolerance in the context of a nonischemic cardiomyopathy and left bundle branch block that began rather precipitously but there is no clear event as suggested by cMRI etc.  She is appropriately considered for CRT-D implantation for relief of symptoms and reduction of risk of sudden death the latter particularly given her young age.  I am concerned however about her weight loss.  There is about a 12 pound weight loss which includes some diuresis  over the initial months of her cardiac care but there have been ongoing weight loss over the last 3 months comprising another 5% plus of her body weight.  Hence, given the GI symptoms i.e. change in stool, malodorous, and the low-grade anemia, we will check a CT scan of chest abdomen and pelvis to make sure there is no primary tumor which might appropriately delay a decision regarding ICD.  There may be appropriate regardless of the findings to consider CRT-P.  Have reviewed the potential benefits and risks of ICD implantation including but not limited to death, perforation of heart or lung,  lead dislodgement, infection,  device malfunction and inappropriate shocks.  The patient expresses understanding  and is willing to proceed.         Virl Axe

## 2022-03-03 NOTE — Telephone Encounter (Signed)
Patient told me she is claustrophobic and would need to be medicated for her CT scan on 03/06/22. Please assist patient with this request.

## 2022-03-03 NOTE — Patient Instructions (Signed)
Medication Instructions:  None ordered.  *If you need a refill on your cardiac medications before your next appointment, please call your pharmacy*   Lab Work: None ordered.  If you have labs (blood work) drawn today and your tests are completely normal, you will receive your results only by: [place] (if you have MyChart) OR A paper copy in the mail If you have any lab test that is abnormal or we need to change your treatment, we will call you to review the results.   Testing/Procedures: Non-Cardiac CT scanning, (CAT scanning), is a noninvasive, special x-ray that produces cross-sectional images of the body using x-rays and a computer. CT scans help physicians diagnose and treat medical conditions. For some CT exams, a contrast material is used to enhance visibility in the area of the body being studied. CT scans provide greater clarity and reveal more details than regular x-ray exams.    Follow-Up: At [facility], you and your health needs are our priority.  As part of our continuing mission to provide you with exceptional heart care, we have created designated Provider Care Teams.  These Care Teams include your primary Cardiologist (physician) and Advanced Practice Providers (APPs -  Physician Assistants and Nurse Practitioners) who all work together to provide you with the care you need, when you need it.  We recommend signing up for the patient portal called "MyChart".  Sign up information is provided on this After Visit Summary.  MyChart is used to connect with patients for Virtual Visits (Telemedicine).  Patients are able to view lab/test results, encounter notes, upcoming appointments, etc.  Non-urgent messages can be sent to your provider as well.   To learn more about what you can do with MyChart, go to [name].    Your next appointment:   Follow up to be determined after CT  Important Information About Sugar

## 2022-03-06 ENCOUNTER — Ambulatory Visit (HOSPITAL_BASED_OUTPATIENT_CLINIC_OR_DEPARTMENT_OTHER)
Admission: RE | Admit: 2022-03-06 | Discharge: 2022-03-06 | Disposition: A | Discharge status: Home / Self Care | Payer: 59 | Source: Ambulatory Visit | Attending: Internal Medicine | Admitting: Internal Medicine

## 2022-03-06 DIAGNOSIS — R634 Abnormal weight loss: Secondary | ICD-10-CM | POA: Insufficient documentation

## 2022-03-06 DIAGNOSIS — J439 Emphysema, unspecified: Secondary | ICD-10-CM | POA: Insufficient documentation

## 2022-03-06 DIAGNOSIS — I517 Cardiomegaly: Secondary | ICD-10-CM | POA: Diagnosis not present

## 2022-03-06 DIAGNOSIS — I2510 Atherosclerotic heart disease of native coronary artery without angina pectoris: Secondary | ICD-10-CM | POA: Diagnosis not present

## 2022-03-06 DIAGNOSIS — I700 Atherosclerosis of aorta: Secondary | ICD-10-CM | POA: Insufficient documentation

## 2022-03-06 MED ORDER — IOHEXOL 300 MG/ML  SOLN
100.0000 mL | Freq: Once | INTRAMUSCULAR | Status: AC | PRN
  Administered 2022-03-06: 80 mL via INTRAVENOUS

## 2022-03-08 ENCOUNTER — Telehealth (HOSPITAL_COMMUNITY): Payer: Self-pay

## 2022-03-08 NOTE — Telephone Encounter (Signed)
Called and was unable to leave to confirm/remind patient of their appointment at the [facility] on 03/09/22.   Stacey Hartman

## 2022-03-09 ENCOUNTER — Encounter: Payer: Self-pay | Admitting: Internal Medicine

## 2022-03-09 ENCOUNTER — Ambulatory Visit (HOSPITAL_COMMUNITY)
Admission: RE | Admit: 2022-03-09 | Discharge: 2022-03-09 | Disposition: A | Discharge status: Home / Self Care | Payer: 59 | Source: Ambulatory Visit | Attending: Family Medicine | Admitting: Family Medicine

## 2022-03-09 ENCOUNTER — Telehealth: Payer: Self-pay

## 2022-03-09 ENCOUNTER — Encounter (HOSPITAL_COMMUNITY): Payer: Self-pay

## 2022-03-09 VITALS — BP 88/58 | HR 85 | Wt 113.8 lb

## 2022-03-09 DIAGNOSIS — Z8249 Family history of ischemic heart disease and other diseases of the circulatory system: Secondary | ICD-10-CM | POA: Insufficient documentation

## 2022-03-09 DIAGNOSIS — Z8673 Personal history of transient ischemic attack (TIA), and cerebral infarction without residual deficits: Secondary | ICD-10-CM | POA: Diagnosis not present

## 2022-03-09 DIAGNOSIS — I69392 Facial weakness following cerebral infarction: Secondary | ICD-10-CM

## 2022-03-09 DIAGNOSIS — I447 Left bundle-branch block, unspecified: Secondary | ICD-10-CM | POA: Insufficient documentation

## 2022-03-09 DIAGNOSIS — R911 Solitary pulmonary nodule: Secondary | ICD-10-CM | POA: Diagnosis not present

## 2022-03-09 DIAGNOSIS — I428 Other cardiomyopathies: Secondary | ICD-10-CM | POA: Diagnosis not present

## 2022-03-09 DIAGNOSIS — E039 Hypothyroidism, unspecified: Secondary | ICD-10-CM | POA: Insufficient documentation

## 2022-03-09 DIAGNOSIS — J439 Emphysema, unspecified: Secondary | ICD-10-CM | POA: Insufficient documentation

## 2022-03-09 DIAGNOSIS — R531 Weakness: Secondary | ICD-10-CM | POA: Insufficient documentation

## 2022-03-09 DIAGNOSIS — R634 Abnormal weight loss: Secondary | ICD-10-CM

## 2022-03-09 DIAGNOSIS — Z7984 Long term (current) use of oral hypoglycemic drugs: Secondary | ICD-10-CM | POA: Diagnosis not present

## 2022-03-09 DIAGNOSIS — K7400 Hepatic fibrosis, unspecified: Secondary | ICD-10-CM | POA: Diagnosis not present

## 2022-03-09 DIAGNOSIS — Z79899 Other long term (current) drug therapy: Secondary | ICD-10-CM | POA: Insufficient documentation

## 2022-03-09 DIAGNOSIS — J449 Chronic obstructive pulmonary disease, unspecified: Secondary | ICD-10-CM

## 2022-03-09 DIAGNOSIS — Z7902 Long term (current) use of antithrombotics/antiplatelets: Secondary | ICD-10-CM | POA: Diagnosis not present

## 2022-03-09 DIAGNOSIS — I5022 Chronic systolic (congestive) heart failure: Secondary | ICD-10-CM | POA: Diagnosis present

## 2022-03-09 DIAGNOSIS — R42 Dizziness and giddiness: Secondary | ICD-10-CM | POA: Insufficient documentation

## 2022-03-09 DIAGNOSIS — Z87891 Personal history of nicotine dependence: Secondary | ICD-10-CM | POA: Diagnosis not present

## 2022-03-09 MED ORDER — METOPROLOL SUCCINATE ER 25 MG PO TB24: 12.5000 mg | ORAL_TABLET | Freq: Every day | ORAL | 3 refills | Status: DC

## 2022-03-09 NOTE — Patient Instructions (Signed)
STOP Coreg  Start Toprol XL 12.'5mg'$ , one tab nightly at bedtime  Your physician has requested that you regularly monitor and record your blood pressure readings at home. Please use the same machine at the same time of day to check your readings and record them to bring to your follow-up visit. -please give our office a call if your systolic blood pressure is less than 95 after medication changes,  Be sure to contact Dr Caryl Comes for follow up on ICD  Your physician recommends that you schedule a follow-up appointment in: 2-3 months with Dr Aundra Dubin  Do the following things EVERYDAY: Weigh yourself in the morning before breakfast. Write it down and keep it in a log. Take your medicines as prescribed Eat low salt foods--Limit salt (sodium) to 2000 mg per day.  Stay as active as you can everyday Limit all fluids for the day to less than 2 liters .  At the [facility], you and your health needs are our priority. As part of our continuing mission to provide you with exceptional heart care, we have created designated Provider Care Teams. These Care Teams include your primary Cardiologist (physician) and Advanced Practice Providers (APPs- Physician Assistants and Nurse Practitioners) who all work together to provide you with the care you need, when you need it.   You may see any of the following providers on your designated Care Team at your next follow up: Dr Glori Bickers Dr Haynes Kerns, NP Lyda Jester, [place] [facility] [place], [place] Audry Riles, PharmD   Please be sure to bring in all your medications bottles to every appointment.

## 2022-03-09 NOTE — Telephone Encounter (Signed)
Spoke with pt who states her CT scan ordered by Dr Caryl Comes has been completed.  Pt advised Dr Caryl Comes will need to review CT results and once he has done this will contact pt with results and recommendations.  Pt verbalizes understanding and agrees with current plan.

## 2022-03-09 NOTE — Addendum Note (Signed)
Encounter addended by: Rafael Bihari, FNP on: 03/09/2022 4:50 PM  Actions taken: Clinical Note Signed

## 2022-03-09 NOTE — Telephone Encounter (Signed)
error 

## 2022-03-09 NOTE — Addendum Note (Signed)
Encounter addended by: Rafael Bihari, FNP on: 03/09/2022 4:49 PM  Actions taken: Clinical Note Signed

## 2022-03-09 NOTE — Progress Notes (Addendum)
ADVANCED HF CLINIC NOTE   Primary Care: Vidal Schwalbe, MD HF Cardiologist: Dr. Aundra Dubin  HPI: Stacey Hartman is a 66 y.o. with a history of hypothyroidism, former tobacco abuse (quit 20 years ago), COPD, and systolic heart failure with severely reduced EF.    Father with "MI," nephew died with heart failure at 84 yrs old.    Saw PCP for increased shortness of breath. Echo 2/23 EF < 20% RV normal.  Sent to ED from cardiology office after echo showed EF < 20%. Had CP and SOB in waiting room/triage and taken for urgent R/LHC. Cardiac cath with no significant CAD, normal PA and PCWP, LVEDP 28 mmHg, CO preserved.  cMRI with LVEF 12%, no myocardial LGE, elevated T1 indices, ECV 42% in basal septum. Developed acute left sided facial numbness during cMRI. Code stroke called. MRI brain with punctate acute infarct in high right frontal cortex. No AF detected during admit. 30-day monitor arranged to evaluate for arrhythmias, showed no AF either. Diuresed with IV lasix and started on GDMT.  PYP obtained and equivocal for transthyretin amyloidosis (grade 1, H/CL 1.47). She was discharged home, weight 122 lbs.  Echo 5/23, EF remained < 20%, moderate LV dilation, normal RV, IVC normal.  Referred to EP for CRT-D.  Follow up 6/23 with Dr. Caryl Comes to discuss CRT-D. Concern for on-going weight loss and CT chest A/P ordered to rule out malignancy before proceeding with CRT. This showed rectal wall thickening, concerning for malignancy, however no other findings to explain weight loss.  Today she returns for HF follow up. Overall feeling fatigued, weak and dizzy. BP is low. No falls. She has dyspnea walking further distances on flat ground. Denies abnormal bleeding, CP, edema, or PND/Orthopnea. Appetite ok. No fever or chills. Weight at home 113 pounds. Taking all medications. Asking about CT results.  Labs (2/23): K 3.9, creatinine 0.63, hgb 12.3 Labs (4/23): K 4.2, creatinine 0.75 Labs (6/23): K 4.0, creatinine 0.76,  LDL 48, HDL 47  ECG (personally reviewed): none ordered today.  PMH: 1. Hypothyroidism 2. 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 Fibromyalgia 4. Raynaud's phenomenon 5. Chronic systolic CHF: Nonischemic cardiomyopathy.  - Echo (2/23): EF < 20%, RV normal.  - LHC/RHC (2/23): mean RA 2, PA 20/12 mean 13, mean PCWP 7, CI 2.2 Fick/2.79 thermo; no significant CAD.  - Cardiac MRI (2/23): LV EF 12%. No myocardial LGE. Elevated T1 indices, ECV was 42% in the basal septum.  - PYP (2/23): Grade 1, H/CL 1.47 (equivocal); TTR gene testing negative.  - Echo (5/23): EF < 20%, moderate LV dilation, normal RV, IVC normal.  6. CVA: 2/23.  - Zio monitor x 1 month showed no AF.   Current Outpatient Medications  Medication Sig Dispense Refill   atorvastatin (LIPITOR) 40 MG tablet Take 1 tablet (40 mg total) by mouth daily. 30 tablet 5   carvedilol (COREG) 3.125 MG tablet Take 1 tablet (3.125 mg total) by mouth 2 (two) times daily with a meal. 60 tablet 5   CLONAZEPAM PO Take 0.5 mg by mouth 2 (two) times daily.     clopidogrel (PLAVIX) 75 MG tablet Take 1 tablet (75 mg total) by mouth daily. 30 tablet 5   dapagliflozin propanediol (FARXIGA) 10 MG TABS tablet Take 1 tablet (10 mg total) by mouth daily. 30 tablet 5   furosemide (LASIX) 20 MG tablet Take 1 tablet (20 mg total) by mouth daily. 90 tablet  3   gabapentin (NEURONTIN) 300 MG capsule Take 300 mg by mouth at bedtime.     levothyroxine (SYNTHROID) 150 MCG tablet TAKE (1) TABLET BY MOUTH DAILY BEFORE BREAKFAST. 30 tablet 11   losartan (COZAAR) 25 MG tablet Take 0.5 tablets (12.5 mg total) by mouth at bedtime. 15 tablet 5   spironolactone (ALDACTONE) 25 MG tablet Take 0.5 tablets (12.5 mg total) by mouth daily. 45 tablet 3   No current facility-administered medications for this encounter.   Allergies  Allergen Reactions   Penicillins     Trouble breathing    Social History    Socioeconomic History   Marital status: Unknown    Spouse name: Not on file   Number of children: Not on file   Years of education: Not on file   Highest education level: Not on file  Occupational History   Not on file  Tobacco Use   Smoking status: Former    Types: Cigarettes    Passive exposure: Current   Smokeless tobacco: Never  Vaping Use   Vaping Use: Never used  Substance and Sexual Activity   Alcohol use: Not Currently   Drug use: Never   Sexual activity: Not on file  Other Topics Concern   Not on file  Social History Narrative   Not on file   Social Determinants of Health   Financial Resource Strain: Not on file  Food Insecurity: Not on file  Transportation Needs: Not on file  Physical Activity: Not on file  Stress: Not on file  Social Connections: Not on file  Intimate Partner Violence: Not on file   Family History  Problem Relation Age of Onset   Melanoma Mother    Heart attack Father    Melanoma Sister    Melanoma Brother    Heart failure Brother    Healthy Son    BP (!) 88/58   Pulse 85   Wt 51.6 kg (113 lb 12.8 oz)   LMP  (LMP Unknown)   SpO2 97%   BMI 20.16 kg/m   Wt Readings from Last 3 Encounters:  03/09/22 51.6 kg (113 lb 12.8 oz)  03/03/22 52.6 kg (116 lb)  01/26/22 53.5 kg (118 lb)   Physical Exam: General:  NAD. No resp difficulty, frail HEENT: Normal Neck: Supple. No JVD. Carotids 2+ bilat; no bruits. No lymphadenopathy or thryomegaly appreciated. Cor: PMI nondisplaced. Regular rate & rhythm. No rubs, gallops or murmurs. Lungs: Clear Abdomen: Soft, nontender, nondistended. No hepatosplenomegaly. No bruits or masses. Good bowel sounds. Extremities: No cyanosis, clubbing, rash, edema Neuro: Alert & oriented x 3, cranial nerves grossly intact. Moves all 4 extremities w/o difficulty. Affect pleasant.   ASSESSMENT & PLAN: 1. Chronic systolic CHF: Nonischemic cardiomyopathy. Etiology not certain. No CAD on LHC. Has LBBB of  uncertain duration.  She has a strong family history of CHF (father died at 26 with "MI", brother with "CHF," nephew with "CHF" died at 57). 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  Echo 2/23 showed EF < 20% with diffuse hypokinesis and normal RV.  No LV thrombus noted. R/LHC in 2/23 showed no significant coronary disease, normal RA pressure and PCWP, LVEDP 28 mmHg, preserved CO. cMRI (2/23) showed no myocardial LGE, ECV 42% in the basal septum. PYP not suggestive of TTR amyloidosis (at most equivocal) and genetic testing for TTR mutation was negative. Echo 5/23 EF remains < 20% with relatively normal RV.  She is not volume overloaded on exam, NYHA class III symptoms. Med titration limited by  low BP.  - Stop Coreg. Start Toprol XL 12.5 mg q hs to see if this gives her more BP. - Given Rx for BP cuff. I asked her to check BP daily and log. Notify if sBP < 95 after above med changes. - Continue spironolactone 12.5 mg daily.  - Continue losartan 12.5 mg q hs. - Continue Farxiga 10 mg daily.  - Continue Lasix 20 mg daily. Recent labs stable, SCr 0.76, K 4.0. - EP considering CRT-D with persistently low EF and wide LBBB, however this is on hold due to #4 (see below). - [employer] cardiomyopathy gene testing to look for possible familial cardiomyopathy has been sent.  - CPX after CRT. - She would be a candidate for LVAD in future if she deteriorates. 2. CVA: Code Stroke during cMRI. Head CT negative, MRI brain + punctate acute infarct in the high right frontal cortex. CTA Head and Neck no LVO. Suspect infarct due to probable cardio embolic and small vessel disease. No Afib detected during admit. 30 day monitor in 3/23 showed no Afib. - Continue Plavix, no ASA.  - Continue atorvastatin, good lipids 6/23. 3. COPD/prior tobacco use:  Chest CT 2/23 showed emphysema + 4 mm RUL Pulmonary nodule.  However, PFTs in 4/23 showed only minimal obstruction.  - CT chest (7/23) showed several 4 mm nodules RLL, emphysema. - She  sees pulmonary. 4. Weight loss: unintentional. CT chest, A/P showed rectal wall thickening, concerning for malignancy. Patient tells me she had had full work up by GI w/ colonoscopy last year and had 2 polyps removed. Unable to see these tests in Care Everywhere, but I do see where she saw Hepatology at [facility] 07/22/20. 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 - Likely needs further work up by GI. I asked her to call Dr. Olin Pia office regarding next steps for CRT-D. Discussed with Dr. Aundra Dubin.  Follow up in 2-3 months with Dr. Aundra Dubin.   Stacey Hartman [facility] FNP-BC 03/09/22

## 2022-03-10 ENCOUNTER — Other Ambulatory Visit (HOSPITAL_COMMUNITY): Payer: Self-pay | Admitting: Cardiology

## 2022-03-16 NOTE — Telephone Encounter (Signed)
Attempted phone call to pt.  Unable to leave voicemail message as voicemail is full. 

## 2022-03-25 NOTE — Telephone Encounter (Signed)
Spoke with pt and advised of CT result and Dr Olin Pia recommendation to contact GI for a follow up appt.  Pt states she sees Dr Posey Pronto in [place] for GI and she will contact their office for scheduling.

## 2022-04-05 ENCOUNTER — Telehealth: Payer: Self-pay | Admitting: Internal Medicine

## 2022-04-05 NOTE — Telephone Encounter (Signed)
Patient states she can't find a Teacher, early years/pre that takes her insurance. She requests the office to help her find one and states she needs a call back as soon as possible. She states to call her friend Roda Shutters number and gives permission for the office to give [name] the information on a gastrologist. [place] phone: [phone]

## 2022-04-08 NOTE — Telephone Encounter (Signed)
Spoke with [name], at the number pt provided who states pt has an appointment scheduled with a GI provider in [place], [place].  She will let pt know RN returned her call.

## 2022-04-08 NOTE — Telephone Encounter (Signed)
Pt is requesting call back in regards to not being able to find a gastrologist due to her heart issues. Requesting call back for advice.

## 2022-04-08 NOTE — Telephone Encounter (Signed)
Spoke with pt who saw her local GI provider in [place], [place] today who states pt does need follow up for abnormal abdominal/pelvic CT scan but with pt's cardiac history he is unwilling to move forward and recommends pt be seen by GI provider affiliated with [facility]. Pt advised to contact her insurance company and to give her recommendations of in-network providers.  Pt verbalizes understanding and states she will call and contact RN 04/09/2022.

## 2022-04-09 ENCOUNTER — Other Ambulatory Visit: Payer: Self-pay

## 2022-04-09 DIAGNOSIS — R9389 Abnormal findings on diagnostic imaging of other specified body structures: Secondary | ICD-10-CM

## 2022-04-09 DIAGNOSIS — K629 Disease of anus and rectum, unspecified: Secondary | ICD-10-CM

## 2022-04-09 NOTE — Progress Notes (Signed)
Order placed for Amb referral to GI per Dr Caryl Comes for abnormal CT/rectal wall thickening and unintentional weight loss.

## 2022-04-09 NOTE — Telephone Encounter (Signed)
Pt has been referred to [facility] for abnormal CT/rectal wall thickening.

## 2022-04-12 ENCOUNTER — Other Ambulatory Visit (HOSPITAL_COMMUNITY): Payer: Self-pay | Admitting: *Deleted

## 2022-04-12 MED ORDER — DAPAGLIFLOZIN PROPANEDIOL 10 MG PO TABS: 10.0000 mg | ORAL_TABLET | Freq: Every day | ORAL | 5 refills | Status: DC

## 2022-04-13 NOTE — Telephone Encounter (Signed)
Spoke with pt who states she has spoken with [facility] to expedite appointment and is having records sent from her current GI.  Pt advised [facility] should be able to view Dr Olin Pia office visit, CT and current recommendation.  Pt states she was advised once records received they will need to be reviewed to determine if referral will be accepted.  Pt states she continues to lose weight and now is at 109 pounds.  Pt advised will forward to Dr Caryl Comes to see if he can assist in expediting appointment.  Pt verbalizes understanding and thankful for any assistance our office can provide.

## 2022-04-22 ENCOUNTER — Telehealth: Payer: Self-pay | Admitting: Gastroenterology

## 2022-04-22 NOTE — Telephone Encounter (Signed)
CARL  HELP  - am out of town,  we saw this lady for ICD, had significant unintentional weight loss, CT concerning for rectal mass, could not get in with GI in [place], and so we made an urgent referral to GI about 10 days ago; is there anything you can do, pretty please, to get this lady seen so we can figure out whether that is a tumor and if so what is next  [facility] so much !!! Richardson Landry

## 2022-04-22 NOTE — Telephone Encounter (Addendum)
Good afternoon Dr. Loletha Carrow,  We received a referral from  [place]  for abnormal ct scan and rectal abnormality. She is a prior patient at [facility] and is asking to come here because she needs to have colonoscopy done before they can put a pacemaker in. Being the supervising AM MD we are asking you to be her provider. Please review and advise of scheduling. Thank you.

## 2022-04-22 NOTE — Telephone Encounter (Signed)
(  For documentation purposes- -this patient had a CT chest abdomen pelvis for work-up of weight loss that suggested rectal wall thickening "concerning for malignancy" according to radiology.  She was then seen by Dr. Trenton Founds, her regular GI physician in [facility] for this on 03/31/2022.  His office note was reviewed as well as EGD and colonoscopy reports from August 2022. Dr. Posey Pronto declines to perform a sigmoidoscopy on her for the CT finding due to her low LV ejection fraction.)  __________________  I will see her in the office to discuss a sigmoidoscopy in the hospital endoscopy lab.  I currently have office openings on September 5 at 3:40 PM and September 7 at 11 AM.  Please call and offer her 1 of those appointments with instructions that she arrive 30 minutes before appointment time.  - H. Danis

## 2022-04-22 NOTE — Telephone Encounter (Signed)
Called the PT back and advised of available appointments. She will take the September 5 at 340pm 30 minutes early. Thank you.

## 2022-04-23 NOTE — Telephone Encounter (Signed)
Richardson Landry,    Please see 8/24 phone notes from/to my referral staff on this patient.  This came across my desk yesterday.  I am seeing her at my next available appointment and will do what I can.  To clarify, she actually did see her regular GI physician in [place] Posey Pronto) on 04/08/22, and he declined to do a procedure on her.  - Wilfrid Lund, [facility]

## 2022-04-28 ENCOUNTER — Encounter: Payer: Self-pay | Admitting: Gastroenterology

## 2022-04-29 ENCOUNTER — Other Ambulatory Visit (HOSPITAL_COMMUNITY): Payer: Self-pay | Admitting: Emergency Medicine

## 2022-04-29 DIAGNOSIS — M810 Age-related osteoporosis without current pathological fracture: Secondary | ICD-10-CM

## 2022-05-06 ENCOUNTER — Ambulatory Visit (INDEPENDENT_AMBULATORY_CARE_PROVIDER_SITE_OTHER): Payer: 59 | Admitting: Gastroenterology

## 2022-05-06 ENCOUNTER — Telehealth: Payer: Self-pay

## 2022-05-06 ENCOUNTER — Encounter: Payer: Self-pay | Admitting: Gastroenterology

## 2022-05-06 ENCOUNTER — Other Ambulatory Visit: Payer: Self-pay

## 2022-05-06 VITALS — BP 112/68 | HR 59 | Ht 63.0 in | Wt 111.4 lb

## 2022-05-06 DIAGNOSIS — R933 Abnormal findings on diagnostic imaging of other parts of digestive tract: Secondary | ICD-10-CM | POA: Diagnosis not present

## 2022-05-06 DIAGNOSIS — K5909 Other constipation: Secondary | ICD-10-CM | POA: Diagnosis not present

## 2022-05-06 DIAGNOSIS — R634 Abnormal weight loss: Secondary | ICD-10-CM

## 2022-05-06 DIAGNOSIS — R110 Nausea: Secondary | ICD-10-CM

## 2022-05-06 NOTE — Telephone Encounter (Signed)
Primary Cardiologist:Kardie Tobb, DO  Chart reviewed as part of pre-operative protocol coverage. Because of Stacey Hartman's past medical history and time since last visit, he/she will require a follow-up visit in order to better assess preoperative cardiovascular risk.  Pre-op covering staff: - Patient has an appointment with Dr. Aundra Dubin on 05/10/22 at which time clearance can be addressed. I have noted preop in appointment notes.  - Please contact requesting surgeon's office via preferred method (i.e, phone, fax) to inform them of need for appointment prior to surgery.  If applicable, this message will also be routed to primary cardiologist for input on holding antiplatelet agent as requested below so that this information is available at time of patient's appointment.   Emmaline Life, NP-C     05/06/2022, 2:31 PM [address], [address] Office [phone] Fax [phone]

## 2022-05-06 NOTE — Patient Instructions (Signed)
_______________________________________________________  If you are age 65 or older, your body mass index should be between 23-30. Your Body mass index is 19.73 kg/m. If this is out of the aforementioned range listed, please consider follow up with your Primary Care Provider.  If you are age 83 or younger, your body mass index should be between 19-25. Your Body mass index is 19.73 kg/m. If this is out of the aformentioned range listed, please consider follow up with your Primary Care Provider.   ________________________________________________________  The [facility] providers would like to encourage you to use [facility] to communicate with providers for non-urgent requests or questions.  Due to long hold times on the telephone, sending your provider a message by [facility] may be a faster and more efficient way to get a response.  Please allow 48 business hours for a response.  Please remember that this is for non-urgent requests.  _______________________________________________________  Dennis Bast have been scheduled for a flexible sigmoidoscopy. Please follow the written instructions given to you at your visit today. If you use inhalers (even only as needed), please bring them with you on the day of your procedure.  Due to recent changes in healthcare laws, you may see the results of your imaging and laboratory studies on MyChart before your provider has had a chance to review them.  We understand that in some cases there may be results that are confusing or concerning to you. Not all laboratory results come back in the same time frame and the provider may be waiting for multiple results in order to interpret others.  Please give [place] 48 hours in order for your provider to thoroughly review all the results before contacting the office for clarification of your results.   It was a pleasure to see you today!  Thank you for trusting me with your gastrointestinal care!

## 2022-05-06 NOTE — Progress Notes (Signed)
[facility] Consult Note:  History: Stacey Hartman 05/06/2022  Referring provider: Vidal Schwalbe, MD  Reason for consult/chief complaint: Weight Loss and Rectal wall thickening   Subjective  HPI: Stacey Hartman is a 66 year old woman with severe congestive heart failure (LVEF 20% or less) as well as other medical issues referred by Dr. Caryl Comes of [facility] cardiology for weight loss and concerns of rectal abnormality on CT scan. This is a request for another opinion and management after she was seen by her regular GI physician Dr. Trenton Founds in [place] on 04/08/2022. Recently, she was diagnosed with severe CHF earlier this year and has undergone extensive testing.  There has been reported significant weight loss which prompted a CT scan chest abdomen and pelvis.  The only reported abnormality potentially related to the weight loss was diffuse rectal wall thickening. Ota was then referred to see Dr. Posey Pronto who had followed her in the past.  His office note from 04/08/2022 was some accompanying endoscopic procedure reports was reviewed.  Patient was reportedly complaining of abdominal pain, gas, heartburn, nausea, vomiting, dysphagia. Jem had an EGD and colonoscopy in August 2022.  This appears to have been done for chronic diarrhea, abdominal pain dysphagia and nausea.  Colonoscopy had normal mucosa biopsied to rule out microscopic colitis and 2 diminutive polyps removed.  EGD had "erythema in the antrum and body", 3 cm hiatal hernia, normal mucosa in the duodenum that was also biopsied.  1 cm duodenal bulb nodularity was also biopsied. Biopsy reports as follows: Duodenal biopsy without evidence of celiac or evidence of parasitic infection.  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  Stomach biopsies negative for H. pylori.  One of the 2 colon polyps was a tubular adenoma and the other hyperplastic.  Other colon biopsies without microscopic colitis. Dr. Posey Pronto indicated this patient was high  risk for complications of endoscopic procedures due to her severe cardiomyopathy, and recommended she have a flexible sigmoidoscopy (possibly without sedation) at [facility] "with cardiology on standby" A CT abdomen and pelvis report done in [place] on 04/03/2021 and ordered by Dr. Posey Pronto at the indication of nausea epigastric pain and loss of appetite.  (Oral contrast given, no IV contrast): Heart is enlarged.  Liver normal without biliary ductal dilatation.  Status postcholecystectomy.  Pancreas normal-appearing.  Kidneys and spleen normal.  Abdominal aorta and IVC normal without aneurysm.  Retroperitoneum normal, no ascites. "Increased soft tissue like density region of the perineum) with large amount of stool through the colon.  Radiologist recommended correlation for rectal prolapse or mass.  03/09/22 comprehensive [facility] consult note reviewed.  Weight loss prompted CT chest/a/p - rectal wall thickening  [name] describes years of chronic constipation for which she takes an occasional OTC laxative.  She does not like to do so because it causes loose stool.  This gives her a feeling of chronic abdominal bloating and sometimes her abdomen is diffusely uncomfortable at nighttime.  She has occasional nausea without vomiting, no dysphagia.  Her appetite has been poor for at least the last year.  She denies loose or greasy stool and has no prior history of pancreatitis.  No previous intestinal surgery.  For few years she has had frequent passage of rectal mucus, which is the main reason she saw Dr. Posey Pronto last year.  Denies rectal bleeding. She is markedly fatigued and also dyspneic with exertion due to the heart failure and feels she has just been in "a waiting game" for months since she was  told that she cannot get an AICD until this issue of rectal abnormality on CT scan is attended to and malignancy ruled out.  ROS:  Review of Systems  Constitutional:  Positive for fatigue. Negative for  appetite change and unexpected weight change.  HENT:  Negative for mouth sores and voice change.   Eyes:  Negative for pain and redness.  Respiratory:  Positive for shortness of breath. Negative for cough.   Cardiovascular:  Negative for chest pain and palpitations.  Genitourinary:  Negative for dysuria and hematuria.  Musculoskeletal:  Positive for myalgias. Negative for arthralgias.  Skin:  Negative for pallor and rash.  Neurological:  Negative for weakness and headaches.  Hematological:  Negative for adenopathy.     Past Medical History: Past Medical History:  Diagnosis Date   Anxiety    Chronic systolic CHF (congestive heart failure), NYHA class 3 (HCC)    Fibromyalgia    Hypothyroid    LBBB (left bundle branch block)    NICM (nonischemic cardiomyopathy) (HCC)    Thyroid disease    Weight loss, non-intentional      Past Surgical History: Past Surgical History:  Procedure Laterality Date   ANKLE SURGERY Right    CHOLECYSTECTOMY     RIGHT/LEFT HEART CATH AND CORONARY ANGIOGRAPHY N/A 10/05/2021   Procedure: RIGHT/LEFT HEART CATH AND CORONARY ANGIOGRAPHY;  Surgeon: Larey Dresser, MD;  Location: [facility];  Service: Cardiovascular;  Laterality: N/A;   TUBAL LIGATION       Family History: Family History  Problem Relation Age of Onset   Melanoma Mother    Heart attack Father    Heart disease Father    Melanoma Sister    Melanoma Brother    Heart failure Brother    Colon cancer Maternal Grandmother    Healthy Son    Stomach cancer Maternal Uncle    Esophageal cancer Neg Hx    Rectal cancer Neg Hx     Social History: Social History   Socioeconomic History   Marital status: Unknown    Spouse name: Not on file   Number of children: 2   Years of education: Not on file   Highest education level: Not on file  Occupational History   Occupation: disabled  Tobacco Use   Smoking status: Former    Types: Cigarettes    Passive exposure: Current    Smokeless tobacco: Never  Vaping Use   Vaping Use: Never used  Substance and Sexual Activity   Alcohol use: Not Currently   Drug use: Never   Sexual activity: Not on file  Other Topics Concern   Not on file  Social History Narrative   Not on file   Social Determinants of Health   Financial Resource Strain: Not on file  Food Insecurity: Not on file  Transportation Needs: Not on file  Physical Activity: Not on file  Stress: Not on file  Social Connections: Not on file    Allergies: Allergies  Allergen Reactions   Penicillins     Trouble breathing     Outpatient Meds: Current Outpatient Medications  Medication Sig Dispense Refill   atorvastatin (LIPITOR) 40 MG tablet TAKE 1 TABLET BY MOUTH ONCE DAILY. 30 tablet 0   carvedilol (COREG) 3.125 MG tablet Take 1 tablet (3.125 mg total) by mouth 2 (two) times daily with a meal. 60 tablet 0   CLONAZEPAM PO Take 0.5 mg by mouth 2 (two) times daily.     clopidogrel (PLAVIX) 75 MG  tablet Take 1 tablet (75 mg total) by mouth daily. 30 tablet 0   dapagliflozin propanediol (FARXIGA) 10 MG TABS tablet Take 1 tablet (10 mg total) by mouth daily. 30 tablet 5   furosemide (LASIX) 20 MG tablet Take 1 tablet (20 mg total) by mouth daily. 90 tablet 3   gabapentin (NEURONTIN) 300 MG capsule Take 300 mg by mouth at bedtime.     levothyroxine (SYNTHROID) 150 MCG tablet TAKE (1) TABLET BY MOUTH DAILY BEFORE BREAKFAST. 30 tablet 11   losartan (COZAAR) 25 MG tablet Take 0.5 tablets (12.5 mg total) by mouth at bedtime. 15 tablet 5   metoprolol succinate (TOPROL-XL) 25 MG 24 hr tablet Take 0.5 tablets (12.5 mg total) by mouth at bedtime. Take with or immediately following a meal. 45 tablet 3   spironolactone (ALDACTONE) 25 MG tablet Take 0.5 tablets (12.5 mg total) by mouth daily. 45 tablet 3   No current facility-administered medications for this visit.      ___________________________________________________________________ Objective    Exam:  BP 112/68   Pulse (!) 59   Ht '5\' 3"'$  (1.6 m)   Wt 111 lb 6.4 oz (50.5 kg)   LMP  (LMP Unknown)   SpO2 97%   BMI 19.73 kg/m  Wt Readings from Last 3 Encounters:  05/06/22 111 lb 6.4 oz (50.5 kg)  03/09/22 113 lb 12.8 oz (51.6 kg)  03/03/22 116 lb (52.6 kg)    General: Thin chronically ill looking woman.  Ambulatory, breathing comfortably on room air Eyes: sclera anicteric, no redness ENT: oral mucosa moist without lesions, no cervical or supraclavicular lymphadenopathy CV: Regular, distant heart sounds, + JVD, no peripheral edema Resp: clear to auscultation bilaterally, normal RR and effort noted GI: soft, no tenderness, with active bowel sounds. No guarding or palpable organomegaly noted.  No bruit Skin; warm and dry, no rash or jaundice noted Neuro: awake, alert and oriented x 3. Normal gross motor function and fluent speech  Labs:     Latest Ref Rng & Units 10/09/2021    2:25 AM 10/08/2021    4:08 AM 10/07/2021    5:09 AM  CBC  WBC 4.0 - 10.5 K/uL 6.2  7.2  7.1   Hemoglobin 12.0 - 15.0 g/dL 12.3  13.0  13.5   Hematocrit 36.0 - 46.0 % 36.6  38.8  39.6   Platelets 150 - 400 K/uL 160  173  166       Latest Ref Rng & Units 02/09/2022    2:34 PM 01/26/2022    2:39 PM 12/18/2021    2:48 PM  CMP  Glucose 70 - 99 mg/dL 106  116  109   BUN 8 - 23 mg/dL '15  18  13   '$ Creatinine 0.44 - 1.00 mg/dL 0.76  0.75  0.75   Sodium 135 - 145 mmol/L 141  138  142   Potassium 3.5 - 5.1 mmol/L 4.0  3.6  4.2   Chloride 98 - 111 mmol/L 107  106  108   CO2 22 - 32 mmol/L '29  26  28   '$ Calcium 8.9 - 10.3 mg/dL 9.3  9.0  8.9    Last albumin 3.9 in Feb 2023  Radiologic Studies:  May 2023 Echo:  1. Left ventricular ejection fraction, by estimation, is <20%. The left  ventricle has severely decreased function. The left ventricle demonstrates  global hypokinesis. The left ventricular internal cavity size was  moderately dilated. Left ventricular  diastolic parameters are consistent  with  Grade I diastolic dysfunction  (impaired relaxation). The average left ventricular global longitudinal  strain is -10.2 %. The global longitudinal strain is abnormal.   2. Right ventricular systolic function is normal. The right ventricular  size is normal. Tricuspid regurgitation signal is inadequate for assessing  PA pressure.   3. Left atrial size was mildly dilated.   4. A small pericardial effusion is present.   5. The mitral valve is normal in structure. Trivial mitral valve  regurgitation. No evidence of mitral stenosis.   6. The aortic valve is tricuspid. Aortic valve regurgitation is not  visualized. No aortic stenosis is present.   7. The inferior vena cava is normal in size with greater than 50%  respiratory variability, suggesting right atrial pressure of 3 mmHg.   FINDINGS   Left Ventricle: Left ventricular ejection fraction, by estimation, is  <20%. The left ventricle has severely decreased function. The left  ventricle demonstrates global hypokinesis. The average left ventricular  global longitudinal strain is -10.2 %. The  global longitudinal strain is abnormal. The left ventricular internal  cavity size was moderately dilated. There is no left ventricular  hypertrophy. Left ventricular diastolic parameters are consistent with  Grade I diastolic dysfunction (impaired  relaxation).   Right Ventricle: The right ventricular size is normal. No increase in  right ventricular wall thickness. Right ventricular systolic function is  normal. Tricuspid regurgitation signal is inadequate for assessing PA  pressure.   Left Atrium: Left atrial size was mildly dilated.   Right Atrium: Right atrial size was normal in size.   Pericardium: A small pericardial effusion is present.   Mitral Valve: The mitral valve is normal in structure. Trivial mitral  valve regurgitation. No evidence of mitral valve stenosis.   Tricuspid Valve: The tricuspid valve is normal in structure.  Tricuspid  valve regurgitation is not demonstrated.   Aortic Valve: The aortic valve is tricuspid. Aortic valve regurgitation is  not visualized. No aortic stenosis is present.   Pulmonic Valve: The pulmonic valve was normal in structure. Pulmonic valve  regurgitation is not visualized.   Aorta: The aortic root is normal in size and structure.   Venous: The inferior vena cava is normal in size with greater than 50%  respiratory variability, suggesting right atrial pressure of 3 mmHg.   IAS/Shunts: No atrial level shunt detected by color flow Doppler.     LEFT VENTRICLE  PLAX 2D  LVIDd:         6.60 cm      Diastology  LVIDs:         5.90 cm      LV e' medial:    3.62 cm/s  LV PW:         1.00 cm      LV E/e' medial:  11.4  LV IVS:        0.90 cm      LV e' lateral:   4.87 cm/s  LVOT diam:     2.00 cm      LV E/e' lateral: 8.5  LV SV:         57  LV SV Index:   37           2D Longitudinal Strain  LVOT Area:     3.14 cm     2D Strain GLS Avg:     -10.2 %    LV Volumes (MOD)  LV vol d, MOD A2C: 209.0 ml 3D Volume EF:  LV vol d,  MOD A4C: 202.0 ml 3D EF:        22 %  LV vol s, MOD A2C: 165.0 ml LV EDV:       216 ml  LV vol s, MOD A4C: 158.0 ml LV ESV:       169 ml  LV SV MOD A2C:     44.0 ml  LV SV:        48 ml  LV SV MOD A4C:     202.0 ml  LV SV MOD BP:      45.9 ml   RIGHT VENTRICLE  RV S prime:     8.93 cm/s  TAPSE (M-mode): 1.6 cm   LEFT ATRIUM             Index        RIGHT ATRIUM           Index  LA diam:        4.30 cm 2.78 cm/m   RA Area:     10.00 cm  LA Vol (A2C):   57.9 ml 37.42 ml/m  RA Volume:   19.90 ml  12.86 ml/m  LA Vol (A4C):   47.6 ml 30.76 ml/m  LA Biplane Vol: 55.2 ml 35.67 ml/m   AORTIC VALVE  LVOT Vmax:   94.40 cm/s  LVOT Vmean:  60.300 cm/s  LVOT VTI:    0.180 m    AORTA  Ao Root diam: 3.20 cm  Ao Asc diam:  3.50 cm   MITRAL VALVE  MV Area (PHT): 4.80 cm    SHUNTS  MV Decel Time: 158 msec    Systemic VTI:  0.18 m  MV E velocity:  41.40 cm/s  Systemic Diam: 2.00 cm  MV A velocity: 83.60 cm/s  MV E/A ratio:  0.50   Dalton McleanMD  Electronically signed by Franki Monte  Signature Date/Time: 01/26/2022/2:17:34 PM  _____________________  CLINICAL DATA:  Weight loss, former smoker.   EXAM: CT CHEST, ABDOMEN, AND PELVIS WITH CONTRAST   TECHNIQUE: Multidetector CT imaging of the chest, abdomen and pelvis was performed following the standard protocol during bolus administration of intravenous contrast.   RADIATION DOSE REDUCTION: This exam was performed according to the departmental dose-optimization program which includes automated exposure control, adjustment of the mA and/or kV according to patient size and/or use of iterative reconstruction technique.   CONTRAST:  71m OMNIPAQUE IOHEXOL 300 MG/ML  SOLN   COMPARISON:  CT chest 10/06/2021.   FINDINGS: CT CHEST FINDINGS   Cardiovascular: Atherosclerotic calcification of the aorta and coronary arteries. Heart is enlarged with left ventricular dilatation. No pericardial effusion.   Mediastinum/Nodes: No pathologically enlarged mediastinal, hilar or axillary lymph nodes. Esophagus is grossly unremarkable.   Lungs/Pleura: Biapical pleuroparenchymal scarring. Centrilobular and paraseptal emphysema. Upper lobe predominant thin walled cysts and nodularity minimal scattered subsegmental volume loss. A few scattered pulmonary nodules measure up to 4 mm in the medial right lower lobe, similar. No pleural fluid. Right posterolateral tracheal diverticulum. Airway is otherwise unremarkable.   Musculoskeletal: Degenerative changes in the spine. No worrisome lytic or sclerotic lesions.   CT ABDOMEN PELVIS FINDINGS   Hepatobiliary: Liver is at the upper limits of normal in size, 17.2 cm. Liver is otherwise unremarkable. Cholecystectomy. No biliary ductal dilatation.   Pancreas: Negative.   Spleen: Negative.   Adrenals/Urinary Tract: Adrenal glands are  unremarkable. Subcentimeter fluid density lesion in the right kidney, too small to characterize but a cyst is likely. No specific follow-up necessary. Kidneys are  otherwise unremarkable. Ureters are decompressed. Bladder is grossly unremarkable.   Stomach/Bowel: Stomach and small bowel are unremarkable. Appendix is not readily visualized. Stool is seen throughout the colon. There is rectal wall thickening.   Vascular/Lymphatic: Atherosclerotic calcification of the aorta. No pathologically enlarged lymph nodes.   Reproductive: Uterus is visualized.  No adnexal mass.   Other: No free fluid.  Mesenteries and peritoneum are unremarkable.   Musculoskeletal: Degenerative changes in the spine. No worrisome lytic or sclerotic lesions.   IMPRESSION: 1. Rectal wall thickening, concerning for malignancy. No additional findings to explain the patient's unintentional weight loss. 2. 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 3. Marked left ventricular dilatation. 4. Stool throughout the colon is indicative of constipation. 5. Aortic atherosclerosis (ICD10-I70.0). Coronary artery calcification. 6.  Emphysema (ICD10-J43.9).     Electronically Signed   By: Lorin Picket M.D.   On: 03/08/2022 10:29  (CT images personally reviewed - H. Danis)    Assessment: Encounter Diagnoses  Name Primary?   Abnormal finding on GI tract imaging Yes   Weight loss    Nausea in adult    Chronic constipation     This patient had an upper endoscopy and colonoscopy for weight loss a year ago with no findings indicating a GI source of that weight loss.  She now has a CT scan read as diffuse nonspecific wall thickening in the rectum.  I think this is almost certainly imaging artifact from lack of oral contrast and under distention of bowel rather than a proctitis, and it does not look like malignancy.   Clinical picture not  consistent with chronic pancreatitis or SIBO. Weight loss seems likely related to COPD and advanced heart failure.  Plan: I will answer this question of whether or not there is truly any rectal abnormality by doing a sigmoidoscopy.  It must be done in the hospital outpatient endoscopy lab due to severity of her heart failure, though I am planning to do an unsedated since I expect it to be a brief, minimally invasive procedure.  She will need to hold Plavix 5 days prior in the event that any biopsies are needed, and we will clear the with her cardiologist.  She was agreeable after discussion of procedure and risks.  The benefits and risks of the planned procedure were described in detail with the patient or (when appropriate) their health care proxy.  Risks were outlined as including, but not limited to, bleeding, infection, perforation, adverse medication reaction leading to cardiac or pulmonary decompensation, pancreatitis (if ERCP).  The limitation of incomplete mucosal visualization was also discussed.  No guarantees or warranties were given.  Even with an unsedated procedure there is still increased cardiovascular risk. Since we are doing hospital outpatient endoscopy lab, anesthesia and cardiology will be available in the event that should occur.   Thank you for the courtesy of this consult.  Please call me with any questions or concerns.  Nelida Meuse III  CC: Referring provider noted above

## 2022-05-06 NOTE — Telephone Encounter (Signed)
[facility] Pre-operative Risk Assessment     Request for surgical clearance:     Endoscopy Procedure  What type of surgery is being performed?     Flex-sigmoid   When is this surgery scheduled?     05-24-2022  What type of clearance is required ?   Pharmacy  Are there any medications that need to be held prior to surgery and how long?   Practice name and name of physician performing surgery?      [facility]  What is your office phone and fax number?      Phone- [phone]  [phone]  Anesthesia type (None, local, MAC, general) ?       MAC

## 2022-05-07 ENCOUNTER — Other Ambulatory Visit (HOSPITAL_COMMUNITY): Payer: Self-pay | Admitting: Cardiology

## 2022-05-07 MED ORDER — CLOPIDOGREL BISULFATE 75 MG PO TABS: 75.0000 mg | ORAL_TABLET | Freq: Every day | ORAL | 6 refills | Status: DC

## 2022-05-10 ENCOUNTER — Encounter (HOSPITAL_COMMUNITY): Payer: Self-pay | Admitting: Cardiology

## 2022-05-10 ENCOUNTER — Ambulatory Visit (HOSPITAL_COMMUNITY)
Admission: RE | Admit: 2022-05-10 | Discharge: 2022-05-10 | Disposition: A | Discharge status: Home / Self Care | Payer: 59 | Source: Ambulatory Visit | Attending: Cardiology | Admitting: Cardiology

## 2022-05-10 VITALS — BP 104/60 | HR 71 | Wt 114.8 lb

## 2022-05-10 DIAGNOSIS — I5022 Chronic systolic (congestive) heart failure: Secondary | ICD-10-CM | POA: Diagnosis present

## 2022-05-10 LAB — BASIC METABOLIC PANEL
Anion gap: 7 (ref 5–15)
BUN: 11 mg/dL (ref 8–23)
CO2: 25 mmol/L (ref 22–32)
Calcium: 9 mg/dL (ref 8.9–10.3)
Chloride: 106 mmol/L (ref 98–111)
Creatinine, Ser: 0.62 mg/dL (ref 0.44–1.00)
GFR, Estimated: >60 mL/min (ref >60)
Glucose, Bld: 94 mg/dL (ref 70–99)
Potassium: 4.2 mmol/L (ref 3.5–5.1)
Sodium: 138 mmol/L (ref 135–145)

## 2022-05-10 LAB — BRAIN NATRIURETIC PEPTIDE: B Natriuretic Peptide: 366.5 pg/mL — ABNORMAL HIGH (ref 0.0–100.0)

## 2022-05-10 MED ORDER — ATORVASTATIN CALCIUM 40 MG PO TABS: 40.0000 mg | ORAL_TABLET | Freq: Every day | ORAL | 3 refills | Status: DC

## 2022-05-10 MED ORDER — SPIRONOLACTONE 25 MG PO TABS: 25.0000 mg | ORAL_TABLET | Freq: Every day | ORAL | 3 refills | Status: DC

## 2022-05-10 MED ORDER — METOPROLOL SUCCINATE ER 25 MG PO TB24: 25.0000 mg | ORAL_TABLET | Freq: Every day | ORAL | 3 refills | Status: DC

## 2022-05-10 MED ORDER — DAPAGLIFLOZIN PROPANEDIOL 10 MG PO TABS: 10.0000 mg | ORAL_TABLET | Freq: Every day | ORAL | 3 refills | Status: DC

## 2022-05-10 NOTE — Patient Instructions (Signed)
Increase Toprol XL to '25mg'$  daily.  Increase Spironolactone to '25mg'$  daily.  Labs done today, your results will be available in MyChart, we will contact you for abnormal readings.  Your physician recommends that you schedule a follow-up appointment in: 1 month.  If you have any questions or concerns before your next appointment please send [place] a message through [place] or call our office at [phone].    TO LEAVE A MESSAGE FOR THE NURSE SELECT OPTION 2, PLEASE LEAVE A MESSAGE INCLUDING: YOUR NAME DATE OF BIRTH CALL BACK NUMBER REASON FOR CALL**this is important as we prioritize the call backs  YOU WILL RECEIVE A CALL BACK THE SAME DAY AS LONG AS YOU CALL BEFORE 4:00 PM   At the [facility], you and your health needs are our priority. As part of our continuing mission to provide you with exceptional heart care, we have created designated Provider Care Teams. These Care Teams include your primary Cardiologist (physician) and Advanced Practice Providers (APPs- Physician Assistants and Nurse Practitioners) who all work together to provide you with the care you need, when you need it.   You may see any of the following providers on your designated Care Team at your next follow up: Dr Glori Bickers Dr Loralie Champagne Dr. Roxana Hires, NP Lyda Jester, [place] [facility] [place], [place] Forestine Na, NP Audry Riles, PharmD   Please be sure to bring in all your medications bottles to every appointment.

## 2022-05-10 NOTE — Progress Notes (Signed)
ReDS Vest / Clip - 05/10/22 1500       ReDS Vest / Clip   Station Marker A    Ruler Value 27    ReDS Value Range Low volume    ReDS Actual Value 30

## 2022-05-11 NOTE — Progress Notes (Signed)
ADVANCED HF CLINIC NOTE   Primary Care: Vidal Schwalbe, MD HF Cardiologist: Dr. Aundra Dubin  HPI: Stacey Hartman is a 66 y.o. with a history of hypothyroidism, former tobacco abuse (quit 20 years ago), COPD, and systolic heart failure with severely reduced EF.    Father with "MI," nephew died with heart failure at 58 yrs old.    Saw PCP for increased shortness of breath. Echo 2/23 EF < 20% RV normal.  Sent to ED from cardiology office after echo showed EF < 20%. Had CP and SOB in waiting room/triage and taken for urgent R/LHC. Cardiac cath with no significant CAD, normal PA and PCWP, LVEDP 28 mmHg, CO preserved.  cMRI with LVEF 12%, no myocardial LGE, elevated T1 indices, ECV 42% in basal septum. Developed acute left sided facial numbness during cMRI. Code stroke called. MRI brain with punctate acute infarct in high right frontal cortex. No AF detected during admit. 30-day monitor arranged to evaluate for arrhythmias, showed no AF either. Diuresed with IV lasix and started on GDMT.  PYP obtained and equivocal for transthyretin amyloidosis (grade 1, H/CL 1.47). She was discharged home, weight 122 lbs.  Echo 5/23, EF remained < 20%, moderate LV dilation, normal RV, IVC normal.  Referred to EP for CRT-D.  Follow up 6/23 with Dr. Caryl Comes to discuss CRT-D. Concern for on-going weight loss and CT chest A/P ordered to rule out malignancy before proceeding with CRT. This showed rectal wall thickening, concerning for malignancy, however no other findings to explain weight loss.  She saw GI, noted to have an unremarkable c-scope about a year ago.  GI thinks unlikely malignancy but has arranged for sigmoidoscopy.   Today she returns for HF follow up. She thinks her dyspnea is generally a little worse.  She has "good days and bad days."  No dyspnea walking into the office today but gets short of breath with stairs and walking long distances.  She has chronic orthopnea, sleeps in hospital bed and raises head of bed.   Not lightheaded. Weight stable.   REDS clip 30%  Labs (2/23): K 3.9, creatinine 0.63, hgb 12.3 Labs (4/23): K 4.2, creatinine 0.75 Labs (6/23): K 4.0, creatinine 0.76, LDL 48, HDL 47  ECG (personally reviewed): NSR, LBBB 178 msec  PMH: 1. Hypothyroidism 2. 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 Fibromyalgia 4. Raynaud's phenomenon 5. Chronic systolic CHF: Nonischemic cardiomyopathy. Negative [employer] gene testing.  - Echo (2/23): EF < 20%, RV normal.  - LHC/RHC (2/23): mean RA 2, PA 20/12 mean 13, mean PCWP 7, CI 2.2 Fick/2.79 thermo; no significant CAD.  - Cardiac MRI (2/23): LV EF 12%. No myocardial LGE. Elevated T1 indices, ECV was 42% in the basal septum.  - PYP (2/23): Grade 1, H/CL 1.47 (equivocal); TTR gene testing negative.  - Echo (5/23): EF < 20%, moderate LV dilation, normal RV, IVC normal.  6. CVA: 2/23.  - Zio monitor x 1 month showed no AF.  7. HCV: Treated.   Current Outpatient Medications  Medication Sig Dispense Refill   carvedilol (COREG) 3.125 MG tablet Take 1 tablet (3.125 mg total) by mouth 2 (two) times daily with a meal. 60 tablet 0   CLONAZEPAM PO Take 0.5 mg by mouth 2 (two) times daily.     clopidogrel (PLAVIX) 75 MG tablet Take 1 tablet (75 mg total) by mouth daily. 30 tablet 6   furosemide (LASIX) 20 MG tablet Take 1  tablet (20 mg total) by mouth daily. 90 tablet 3   gabapentin (NEURONTIN) 300 MG capsule Take 300 mg by mouth at bedtime.     levothyroxine (SYNTHROID) 150 MCG tablet TAKE (1) TABLET BY MOUTH DAILY BEFORE BREAKFAST. 30 tablet 11   losartan (COZAAR) 25 MG tablet Take 0.5 tablets (12.5 mg total) by mouth at bedtime. 15 tablet 5   atorvastatin (LIPITOR) 40 MG tablet Take 1 tablet (40 mg total) by mouth daily. 90 tablet 3   dapagliflozin propanediol (FARXIGA) 10 MG TABS tablet Take 1 tablet (10 mg total) by mouth daily. 90 tablet 3   metoprolol succinate (TOPROL-XL) 25 MG 24 hr tablet  Take 1 tablet (25 mg total) by mouth at bedtime. Take with or immediately following a meal. 90 tablet 3   spironolactone (ALDACTONE) 25 MG tablet Take 1 tablet (25 mg total) by mouth daily. 90 tablet 3   No current facility-administered medications for this encounter.   Allergies  Allergen Reactions   Penicillins     Trouble breathing    Social History   Socioeconomic History   Marital status: Unknown    Spouse name: Not on file   Number of children: 2   Years of education: Not on file   Highest education level: Not on file  Occupational History   Occupation: disabled  Tobacco Use   Smoking status: Former    Types: Cigarettes    Passive exposure: Current   Smokeless tobacco: Never  Vaping Use   Vaping Use: Never used  Substance and Sexual Activity   Alcohol use: Not Currently   Drug use: Never   Sexual activity: Not on file  Other Topics Concern   Not on file  Social History Narrative   Not on file   Social Determinants of Health   Financial Resource Strain: Not on file  Food Insecurity: Not on file  Transportation Needs: Not on file  Physical Activity: Not on file  Stress: Not on file  Social Connections: Not on file  Intimate Partner Violence: Not on file   Family History  Problem Relation Age of Onset   Melanoma Mother    Heart attack Father    Heart disease Father    Melanoma Sister    Melanoma Brother    Heart failure Brother    Colon cancer Maternal Grandmother    Healthy Son    Stomach cancer Maternal Uncle    Esophageal cancer Neg Hx    Rectal cancer Neg Hx    BP 104/60   Pulse 71   Wt 52.1 kg (114 lb 12.8 oz)   LMP  (LMP Unknown)   SpO2 98%   BMI 20.34 kg/m   Wt Readings from Last 3 Encounters:  05/10/22 52.1 kg (114 lb 12.8 oz)  05/06/22 50.5 kg (111 lb 6.4 oz)  03/09/22 51.6 kg (113 lb 12.8 oz)  General: NAD Neck: No JVD, no thyromegaly or thyroid nodule.  Lungs: Clear to auscultation bilaterally with normal respiratory effort. CV:  Nondisplaced PMI.  Heart regular S1/S2 with paradoxically split S2, no S3/S4, no murmur.  No peripheral edema.  No carotid bruit.  Normal pedal pulses.  Abdomen: Soft, nontender, no hepatosplenomegaly, no distention.  Skin: Intact without lesions or rashes.  Neurologic: Alert and oriented x 3.  Psych: Normal affect. Extremities: No clubbing or cyanosis.  HEENT: Normal.    ASSESSMENT & PLAN: 1. Chronic systolic CHF: Nonischemic cardiomyopathy. Etiology not certain. No CAD on LHC. 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 She has a strong family history of CHF (father died at 35 with "MI", brother with "CHF," nephew with "CHF" died at 52). Possible familial cardiomyopathy but [employer] gene testing was negative.  Echo 2/23 showed EF < 20% with diffuse hypokinesis and normal RV.  No LV thrombus noted. R/LHC in 2/23 showed no significant coronary disease, normal RA pressure and PCWP, LVEDP 28 mmHg, preserved CO. cMRI (2/23) showed no myocardial LGE, ECV 42% in the basal septum. PYP not suggestive of TTR amyloidosis (at most equivocal) and genetic testing for TTR mutation was negative. Echo 5/23 EF remains < 20% with relatively normal RV.  She is not volume overloaded on exam or by REDS clip, NYHA class III symptoms chronically. Med titration limited by low BP.  - Increase Toprol XL to 25 mg daily.  - Increase spironolactone to 25 mg daily. BMET/BNP today, BMET in 10 days.  - Continue losartan 12.5 mg qhs, suspect BP will not tolerate transition to Entresto.  - Continue Farxiga 10 mg daily.  - Continue Lasix 20 mg daily.  - She will need CRT-D with possible LBBB CMP.  She has been seeing Dr. Caryl Comes, to have sigmoidoscopy to rule out colon cancer first.  - CPX after CRT. - She would be a candidate for LVAD in future if she deteriorates as long as no malignancy is found.  2. CVA: Code Stroke during cMRI. Head CT negative, MRI brain + punctate acute infarct in the high right frontal cortex. CTA Head and  Neck no LVO. Suspect infarct due to probable cardio embolic and small vessel disease. No Afib detected during admit. 30 day monitor in 3/23 showed no Afib. - Continue Plavix, no ASA.  - Continue atorvastatin, good lipids 6/23. 3. COPD/prior tobacco use:  Chest CT 2/23 showed emphysema + 4 mm RUL Pulmonary nodule.  However, PFTs in 4/23 showed only minimal obstruction. CT chest (7/23) showed several 4 mm nodules RLL, emphysema. - She sees pulmonary. 4. Weight loss: unintentional. CT chest, A/P showed rectal wall thickening, concerning for malignancy. Patient tells me she had had full work up by GI w/ colonoscopy last year and had 2 polyps removed.  She saw GI (Dr. Loletha Carrow), think malignancy is unlikely but plan for sigmoidoscopy.  - OK to hold Plavix x 5 days for sigmoidoscopy.  Followup APP 1 month.    Loralie Champagne  05/11/22

## 2022-05-12 NOTE — Telephone Encounter (Signed)
Left message for pt to call.

## 2022-05-13 NOTE — Telephone Encounter (Signed)
From Dr Oleh Genin office notes :  Patient tells me she had had full work up by GI w/ colonoscopy last year and had 2 polyps removed.  She saw GI (Dr. Loletha Carrow), think malignancy is unlikely but plan for sigmoidoscopy.  - OK to hold Plavix x 5 days for sigmoidoscopy.  Patient has been notified and aware to hold the Plavix for 5 days prior to colonoscopy.

## 2022-05-24 ENCOUNTER — Other Ambulatory Visit: Payer: Self-pay

## 2022-05-24 ENCOUNTER — Encounter (HOSPITAL_COMMUNITY): Payer: Self-pay | Admitting: Gastroenterology

## 2022-05-24 ENCOUNTER — Encounter (HOSPITAL_COMMUNITY)
Admission: RE | Disposition: A | Discharge status: Home / Self Care | Payer: Self-pay | Source: Home / Self Care | Attending: Gastroenterology

## 2022-05-24 ENCOUNTER — Ambulatory Visit (HOSPITAL_COMMUNITY)
Admission: RE | Admit: 2022-05-24 | Discharge: 2022-05-24 | Disposition: A | Discharge status: Home / Self Care | Payer: 59 | Attending: Gastroenterology | Admitting: Gastroenterology

## 2022-05-24 DIAGNOSIS — R634 Abnormal weight loss: Secondary | ICD-10-CM | POA: Diagnosis present

## 2022-05-24 DIAGNOSIS — I5022 Chronic systolic (congestive) heart failure: Secondary | ICD-10-CM | POA: Insufficient documentation

## 2022-05-24 DIAGNOSIS — K5909 Other constipation: Secondary | ICD-10-CM

## 2022-05-24 DIAGNOSIS — R110 Nausea: Secondary | ICD-10-CM

## 2022-05-24 DIAGNOSIS — R948 Abnormal results of function studies of other organs and systems: Secondary | ICD-10-CM | POA: Diagnosis not present

## 2022-05-24 DIAGNOSIS — Z7902 Long term (current) use of antithrombotics/antiplatelets: Secondary | ICD-10-CM | POA: Insufficient documentation

## 2022-05-24 DIAGNOSIS — R933 Abnormal findings on diagnostic imaging of other parts of digestive tract: Secondary | ICD-10-CM | POA: Diagnosis not present

## 2022-05-24 HISTORY — PX: FLEXIBLE SIGMOIDOSCOPY: SHX5431

## 2022-05-24 SURGERY — SIGMOIDOSCOPY, FLEXIBLE

## 2022-05-24 MED ORDER — SODIUM CHLORIDE 0.9 % IV SOLN: INTRAVENOUS | Status: DC

## 2022-05-24 NOTE — Interval H&P Note (Signed)
History and Physical Interval Note:  05/24/2022 7:36 AM  Stacey Hartman  has presented today for surgery, with the diagnosis of abnormal GI imaging, weight loss.  The various methods of treatment have been discussed with the patient and family. After consideration of risks, benefits and other options for treatment, the patient has consented to  Procedure(s) with comments: FLEXIBLE SIGMOIDOSCOPY (N/A) - NO SEDATION as a surgical intervention.  The patient's history has been reviewed, patient examined, no change in status, stable for surgery.  I have reviewed the patient's chart and labs.  Questions were answered to the patient's satisfaction.     Nelida Meuse III

## 2022-05-24 NOTE — H&P (Signed)
History and Physical:  This patient presents for endoscopic testing for: Abnormal imaging GI tract Weight loss  This 66 year old woman is here today for an unsedated sigmoidoscopy due to findings of reported rectal wall thickening on CT chest abdomen and pelvis, imaging which was done to work-up marked weight loss.  Extensive clinical details of her history are in my office consult note of 05/06/2022.  She reports no clinical changes since then, has been in to see her cardiologist on 05/10/2022.  Her LVEF remains under 20%  Patient is otherwise without complaints or active issues today.   Past Medical History: Past Medical History:  Diagnosis Date   Anxiety    Chronic systolic CHF (congestive heart failure), NYHA class 3 (HCC)    Fibromyalgia    Hypothyroid    LBBB (left bundle branch block)    NICM (nonischemic cardiomyopathy) (HCC)    Thyroid disease    Weight loss, non-intentional      Past Surgical History: Past Surgical History:  Procedure Laterality Date   ANKLE SURGERY Right    CHOLECYSTECTOMY     RIGHT/LEFT HEART CATH AND CORONARY ANGIOGRAPHY N/A 10/05/2021   Procedure: RIGHT/LEFT HEART CATH AND CORONARY ANGIOGRAPHY;  Surgeon: Larey Dresser, MD;  Location: [facility];  Service: Cardiovascular;  Laterality: N/A;   TUBAL LIGATION      Allergies: Allergies  Allergen Reactions   Penicillins Shortness Of Breath    Trouble breathing    Sulfa Antibiotics Other (See Comments)    Causes Thrush     Outpatient Meds: Current Facility-Administered Medications  Medication Dose Route Frequency Provider Last Rate Last Admin   0.9 %  sodium chloride infusion   Intravenous Continuous Danis, Estill Cotta III, MD          ___________________________________________________________________ Objective   Exam:  BP 102/64   Pulse 74   Temp 98 F (36.7 C) (Tympanic)   Resp (!) 22   Ht '5\' 3"'$  (1.6 m)   Wt 52.1 kg   LMP  (LMP Unknown)   BMI 20.35 kg/m   CV: RRR  without murmur, S1/S2 Resp: clear to auscultation bilaterally, normal RR and effort noted GI: soft, no tenderness, with active bowel sounds.   Assessment: Abnormal imaging GI tract, specifically question of rectal wall thickening on CT chest on abdomen pelvis without oral contrast.   Plan: Sigmoidoscopy   The patient is appropriate for an endoscopic procedure in the ambulatory setting.   - Wilfrid Lund, MD

## 2022-05-24 NOTE — Discharge Instructions (Signed)
YOU HAD AN ENDOSCOPIC PROCEDURE TODAY: Refer to the procedure report and other information in the discharge instructions given to you for any specific questions about what was found during the examination. If this information does not answer your questions, please call [facility] office at [phone] to clarify.  ° °YOU SHOULD EXPECT: Some feelings of bloating in the abdomen. Passage of more gas than usual. Walking can help get rid of the air that was put into your GI tract during the procedure and reduce the bloating. If you had a lower endoscopy (such as a colonoscopy or flexible sigmoidoscopy) you may notice spotting of blood in your stool or on the toilet paper. Some abdominal soreness may be present for a day or two, also. ° °DIET: Your first meal following the procedure should be a light meal and then it is ok to progress to your normal diet. A half-sandwich or bowl of soup is an example of a good first meal. Heavy or fried foods are harder to digest and may make you feel nauseous or bloated. Drink plenty of fluids but you should avoid alcoholic beverages for 24 hours. If you had a esophageal dilation, please see attached instructions for diet.   ° °ACTIVITY: Your care partner should take you home directly after the procedure. You should plan to take it easy, moving slowly for the rest of the day. You can resume normal activity the day after the procedure however YOU SHOULD NOT DRIVE, use power tools, machinery or perform tasks that involve climbing or major physical exertion for 24 hours (because of the sedation medicines used during the test).  ° °SYMPTOMS TO REPORT IMMEDIATELY: °A gastroenterologist can be reached at any hour. Please call [phone]  for any of the following symptoms:  °Following lower endoscopy (colonoscopy, flexible sigmoidoscopy) °Excessive amounts of blood in the stool  °Significant tenderness, worsening of abdominal pains  °Swelling of the abdomen that is new, acute  °Fever of 100° or  higher  °Following upper endoscopy (EGD, EUS, ERCP, esophageal dilation) °Vomiting of blood or coffee ground material  °New, significant abdominal pain  °New, significant chest pain or pain under the shoulder blades  °Painful or persistently difficult swallowing  °New shortness of breath  °Black, tarry-looking or red, bloody stools ° °FOLLOW UP:  °If any biopsies were taken you will be contacted by phone or by letter within the next 1-3 weeks. Call [phone]  if you have not heard about the biopsies in 3 weeks.  °Please also call with any specific questions about appointments or follow up tests. ° °

## 2022-05-24 NOTE — Op Note (Signed)
[facility] Patient Name: Stacey Hartman Procedure Date : 05/24/2022 MRN: [id number] Attending MD: Estill Cotta. Danis , MD Date of Birth: Jun 23, 1956 CSN: [id number] Age: 66 Admit Type: Outpatient Procedure:                Flexible Sigmoidoscopy Indications:              Abnormal CT of the GI tract (nonspecific rectal                            wall thickening on noncontrast CTAP                           weight loss                           (Patient had EGD and colonoscopy for weight loss in                            [place], [place] in Aug 2022 Providers:                Estill Cotta. Loletha Carrow, MD, Jaci Carrel, RN, Darliss Cheney, Technician Referring MD:             Deboraha Sprang, MD Medicines:                None Complications:            No immediate complications. Estimated Blood Loss:     Estimated blood loss: none. Procedure:                Pre-Anesthesia Assessment:                           - Prior to the procedure, a History and Physical                            was performed, and patient medications and                            allergies were reviewed. The patient's tolerance of                            previous anesthesia was also reviewed. The risks                            and benefits of the procedure and the sedation                            options and risks were discussed with the patient.                            All questions were answered, and informed consent                            was obtained.  Prior Anticoagulants: The patient has                            taken Plavix (clopidogrel), last dose was 5 days                            prior to procedure. ASA Grade Assessment: IV - A                            patient with severe systemic disease that is a                            constant threat to life. After reviewing the risks                            and benefits, the patient was deemed in                             satisfactory condition to undergo the procedure.                           After obtaining informed consent, the scope was                            passed under direct vision. The [id number]-[id number] ([id number])                            Olympus coloscope was introduced through the anus                            and advanced to the the sigmoid colon. The flexible                            sigmoidoscopy was accomplished without difficulty.                            The patient tolerated the procedure well. The                            quality of the bowel preparation was excellent. Scope In: Scope Out: Findings:      The perianal and digital rectal examinations were normal.      The entire examined colon appeared normal. Impression:               - The entire examined colon is normal.                           - No specimens collected.                           Normal rectum. CT artifact. No source seen for                            weight loss, which may be due to  the patient's                            advanced heart failure. Recommendation:           - Patient has a contact number available for                            emergencies. The signs and symptoms of potential                            delayed complications were discussed with the                            patient. Return to normal activities tomorrow.                            Written discharge instructions were provided to the                            patient.                           - Resume previous diet.                           - Resume Plavix (clopidogrel) at prior dose today.                           - Call your cardiologist for further management of                            your CHF. Procedure Code(s):        --- Professional ---                           [phone], Sigmoidoscopy, flexible; diagnostic,                            including collection of specimen(s) by brushing or                             washing, when performed (separate procedure) Diagnosis Code(s):        --- Professional ---                           R93.3, Abnormal findings on diagnostic imaging of                            other parts of digestive tract CPT copyright 2019 [employer]. All rights reserved. The codes documented in this report are preliminary and upon coder review may  be revised to meet current compliance requirements. Manali Mcelmurry L. Loletha Carrow, MD 05/24/2022 8:39:37 AM This report has been signed electronically. Number of Addenda: 0

## 2022-05-25 ENCOUNTER — Encounter (HOSPITAL_COMMUNITY): Payer: Self-pay | Admitting: Gastroenterology

## 2022-06-02 ENCOUNTER — Other Ambulatory Visit (HOSPITAL_COMMUNITY): Payer: Self-pay

## 2022-06-02 MED ORDER — LOSARTAN POTASSIUM 25 MG PO TABS: 12.5000 mg | ORAL_TABLET | Freq: Every day | ORAL | 5 refills | Status: DC

## 2022-06-06 NOTE — Progress Notes (Incomplete)
ADVANCED HF CLINIC NOTE   Primary Care: Vidal Schwalbe, MD HF Cardiologist: Dr. Aundra Dubin  HPI: Stacey Hartman is a 66 y.o. with a history of hypothyroidism, former tobacco abuse (quit 20 years ago), COPD, and systolic heart failure with severely reduced EF.    Father with "MI," nephew died with heart failure at 74 yrs old.    Echo 2/23 EF < 20% RV normal.  Sent to ED from cardiology office after echo completed.  Had CP and SOB in waiting room/triage and taken for urgent R/LHC. Cardiac cath with no significant CAD, normal PA and PCWP, LVEDP 28 mmHg, CO preserved.  cMRI with LVEF 12%, no myocardial LGE, elevated T1 indices, ECV 42% in basal septum. Developed acute left sided facial numbness during cMRI. Code stroke called. MRI brain with punctate acute infarct in high right frontal cortex. No AF detected during admit. 30-day monitor showed no AF either. PYP equivocal for transthyretin amyloidosis (grade 1, H/CL 1.47). She was discharged home, weight 122 lbs.  Echo 5/23, EF remained < 20%, moderate LV dilation, normal RV, IVC normal.  Referred to EP for CRT-D.  Follow up 6/23 with Dr. Caryl Comes to discuss CRT-D. Concern for on-going weight loss and CT chest A/P ordered to rule out malignancy before proceeding with CRT. This showed rectal wall thickening, concerning for malignancy, however no other findings to explain weight loss.  She saw GI, noted to have an unremarkable c-scope about a year ago.  Had sigmoidoscopy 09/23 with no evidence of malignancy.  Here today for close follow-up. Feels like dyspnea has gradually been getting worse. Unable to walk a block without feeling winded. Often feels like she can't take a full breath. Has chronic orthopnea and sleeps with head of bed elevated. No PND. Has some lower extremity edema towards end of day that resolves overnight. Takes all of her medicines as prescribed.   REDS clip 32%  Labs (2/23): K 3.9, creatinine 0.63, hgb 12.3 Labs (4/23): K 4.2, creatinine  0.75 Labs (6/23): K 4.0, creatinine 0.76, LDL 48, HDL 47 Labs (09/23): K 4.2, creatinine 0.62, BNP 366  PMH: 1. Hypothyroidism 2. 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 Fibromyalgia 4. Raynaud's phenomenon 5. Chronic systolic CHF: Nonischemic cardiomyopathy. Negative [employer] gene testing.  - Echo (2/23): EF < 20%, RV normal.  - LHC/RHC (2/23): mean RA 2, PA 20/12 mean 13, mean PCWP 7, CI 2.2 Fick/2.79 thermo; no significant CAD.  - Cardiac MRI (2/23): LV EF 12%. No myocardial LGE. Elevated T1 indices, ECV was 42% in the basal septum.  - PYP (2/23): Grade 1, H/CL 1.47 (equivocal); TTR gene testing negative.  - Echo (5/23): EF < 20%, moderate LV dilation, normal RV, IVC normal.  6. CVA: 2/23.  - Zio monitor x 1 month showed no AF.  7. HCV: Treated.   Current Outpatient Medications  Medication Sig Dispense Refill   atorvastatin (LIPITOR) 40 MG tablet Take 1 tablet (40 mg total) by mouth daily. 90 tablet 3   carboxymethylcellulose (REFRESH TEARS) 0.5 % SOLN Place 1 drop into both eyes 3 (three) times daily as needed (dry eyes).     clonazePAM (KLONOPIN) 0.5 MG tablet Take 0.5 mg by mouth 2 (two) times daily.     clopidogrel (PLAVIX) 75 MG tablet Take 1 tablet (75 mg total) by mouth daily. 30 tablet 6   dapagliflozin propanediol (FARXIGA) 10 MG TABS tablet Take 1 tablet (10 mg total) by  mouth daily. 90 tablet 3   furosemide (LASIX) 20 MG tablet Take 1 tablet (20 mg total) by mouth daily. 90 tablet 3   gabapentin (NEURONTIN) 300 MG capsule Take 300 mg by mouth at bedtime.     levothyroxine (SYNTHROID) 150 MCG tablet TAKE (1) TABLET BY MOUTH DAILY BEFORE BREAKFAST. 30 tablet 11   losartan (COZAAR) 25 MG tablet Take 0.5 tablets (12.5 mg total) by mouth at bedtime. 15 tablet 5   metoprolol succinate (TOPROL-XL) 25 MG 24 hr tablet Take 1 tablet (25 mg total) by mouth at bedtime. Take with or immediately following a meal. 90 tablet 3    spironolactone (ALDACTONE) 25 MG tablet Take 1 tablet (25 mg total) by mouth daily. 90 tablet 3   No current facility-administered medications for this encounter.   Allergies  Allergen Reactions   Penicillins Shortness Of Breath    Trouble breathing    Sulfa Antibiotics Other (See Comments)    Causes Thrush    Social History   Socioeconomic History   Marital status: Unknown    Spouse name: Not on file   Number of children: 2   Years of education: Not on file   Highest education level: Not on file  Occupational History   Occupation: disabled  Tobacco Use   Smoking status: Former    Types: Cigarettes    Passive exposure: Current   Smokeless tobacco: Never  Vaping Use   Vaping Use: Never used  Substance and Sexual Activity   Alcohol use: Not Currently   Drug use: Never   Sexual activity: Not on file  Other Topics Concern   Not on file  Social History Narrative   Not on file   Social Determinants of Health   Financial Resource Strain: Not on file  Food Insecurity: Not on file  Transportation Needs: Not on file  Physical Activity: Not on file  Stress: Not on file  Social Connections: Not on file  Intimate Partner Violence: Not on file   Family History  Problem Relation Age of Onset   Melanoma Mother    Heart attack Father    Heart disease Father    Melanoma Sister    Melanoma Brother    Heart failure Brother    Colon cancer Maternal Grandmother    Healthy Son    Stomach cancer Maternal Uncle    Esophageal cancer Neg Hx    Rectal cancer Neg Hx    BP 94/60   Pulse 70   Wt 51.6 kg (113 lb 12.8 oz)   LMP  (LMP Unknown)   SpO2 96%   BMI 20.16 kg/m   Wt Readings from Last 3 Encounters:  06/07/22 51.6 kg (113 lb 12.8 oz)  05/24/22 52.1 kg (114 lb 13.8 oz)  05/10/22 52.1 kg (114 lb 12.8 oz)   General:  Thin, appears stated age.  HEENT: normal Neck: supple. no JVD. Carotids 2+ bilat; no bruits.  Cor: PMI nondisplaced. Regular rate & rhythm. No rubs,  gallops or murmurs. Lungs: diminished Abdomen: soft, nontender, nondistended.  Extremities: no cyanosis, clubbing, rash, edema Neuro: alert & orientedx3, cranial nerves grossly intact. moves all 4 extremities w/o difficulty. Affect pleasant    ASSESSMENT & PLAN: 1. Chronic systolic CHF/NICM/LBBB: Etiology not certain. No CAD on LHC. 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 She has a strong family history of CHF (father died at 67 with "MI", brother with "CHF," nephew with "CHF" died at 78). Possible familial cardiomyopathy but [employer] gene testing was negative.  Echo 2/23 showed EF < 20% with diffuse hypokinesis and normal RV.  No LV thrombus noted. R/LHC in 2/23 showed no significant coronary disease, normal RA pressure and PCWP, LVEDP 28 mmHg, preserved CO. cMRI (2/23) showed no myocardial LGE, ECV 42% in the basal septum. PYP not suggestive of TTR amyloidosis (at most equivocal) and genetic testing for TTR mutation was negative. Echo 5/23 EF remains < 20% with relatively normal RV.  She is not volume overloaded on exam or by REDS clip (32%), NYHA class III symptoms chronically. Med titration limited by low BP.  -Continue Toprol XL 25 mg daily.  -Continue spiro 25 mg daily.  - Continue losartan 12.5 mg qhs, suspect BP will not tolerate transition to Entresto.  - Continue Farxiga 10 mg daily.  - Continue Lasix 20 mg daily.  - Labs today - She will need CRT-D with possible LBBB CMP.  She has been seeing Dr. Caryl Comes, referred for f/u now that she has completed GI workup - CPX after CRT. Wonder how much of her functional limitation is due to heart failure vs pulmonary disease, suspect combination of both.  2. CVA: Code Stroke during cMRI. Head CT negative, MRI brain + punctate acute infarct in the high right frontal cortex. CTA Head and Neck no LVO. Suspect infarct due to probable cardio embolic and small vessel disease. No Afib detected during admit. 30 day monitor in 3/23 showed no Afib. -  Continue Plavix, no ASA.  - Continue atorvastatin, good lipids 6/23. 3. 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  However, PFTs in 4/23 showed only minimal obstruction.  - CT chest (7/23) showed emphysema as well as several cysts and nodules raising suspicion for Langerhans cell histiocytosis. Sees pulmonary, will refer back to Dr. [name] 4. Weight loss: unintentional. CT chest, A/P showed rectal wall thickening, concerning for malignancy. Patient tells me she had had full work up by GI w/ colonoscopy last year and had 2 polyps removed.  No malignancy on sigmoidoscopy 09/23.    Followup 2 months with APP, 4 months with Dr. Aundra Dubin  [facility], [place] Comment N  06/07/22

## 2022-06-07 ENCOUNTER — Encounter (HOSPITAL_COMMUNITY): Payer: Self-pay

## 2022-06-07 ENCOUNTER — Ambulatory Visit (HOSPITAL_COMMUNITY)
Admission: RE | Admit: 2022-06-07 | Discharge: 2022-06-07 | Disposition: A | Discharge status: Home / Self Care | Payer: 59 | Source: Ambulatory Visit | Attending: Physician Assistant | Admitting: Physician Assistant

## 2022-06-07 VITALS — BP 94/60 | HR 70 | Wt 113.8 lb

## 2022-06-07 DIAGNOSIS — I428 Other cardiomyopathies: Secondary | ICD-10-CM | POA: Insufficient documentation

## 2022-06-07 DIAGNOSIS — Z7984 Long term (current) use of oral hypoglycemic drugs: Secondary | ICD-10-CM | POA: Insufficient documentation

## 2022-06-07 DIAGNOSIS — Z87891 Personal history of nicotine dependence: Secondary | ICD-10-CM | POA: Insufficient documentation

## 2022-06-07 DIAGNOSIS — J439 Emphysema, unspecified: Secondary | ICD-10-CM | POA: Diagnosis not present

## 2022-06-07 DIAGNOSIS — I639 Cerebral infarction, unspecified: Secondary | ICD-10-CM

## 2022-06-07 DIAGNOSIS — Z79899 Other long term (current) drug therapy: Secondary | ICD-10-CM | POA: Insufficient documentation

## 2022-06-07 DIAGNOSIS — Z8673 Personal history of transient ischemic attack (TIA), and cerebral infarction without residual deficits: Secondary | ICD-10-CM | POA: Insufficient documentation

## 2022-06-07 DIAGNOSIS — E039 Hypothyroidism, unspecified: Secondary | ICD-10-CM | POA: Diagnosis not present

## 2022-06-07 DIAGNOSIS — I5022 Chronic systolic (congestive) heart failure: Secondary | ICD-10-CM | POA: Diagnosis present

## 2022-06-07 DIAGNOSIS — C966 Unifocal Langerhans-cell histiocytosis: Secondary | ICD-10-CM

## 2022-06-07 DIAGNOSIS — Z7902 Long term (current) use of antithrombotics/antiplatelets: Secondary | ICD-10-CM | POA: Insufficient documentation

## 2022-06-07 DIAGNOSIS — I447 Left bundle-branch block, unspecified: Secondary | ICD-10-CM | POA: Insufficient documentation

## 2022-06-07 LAB — BASIC METABOLIC PANEL
Anion gap: 7 (ref 5–15)
BUN: 13 mg/dL (ref 8–23)
CO2: 29 mmol/L (ref 22–32)
Calcium: 9.3 mg/dL (ref 8.9–10.3)
Chloride: 105 mmol/L (ref 98–111)
Creatinine, Ser: 0.7 mg/dL (ref 0.44–1.00)
GFR, Estimated: >60 mL/min (ref >60)
Glucose, Bld: 97 mg/dL (ref 70–99)
Potassium: 3.6 mmol/L (ref 3.5–5.1)
Sodium: 141 mmol/L (ref 135–145)

## 2022-06-07 LAB — BRAIN NATRIURETIC PEPTIDE: B Natriuretic Peptide: 442 pg/mL — ABNORMAL HIGH (ref 0.0–100.0)

## 2022-06-07 NOTE — Patient Instructions (Addendum)
Thank you for coming in today  Labs were done today, if any labs are abnormal the clinic will call you No news is good news  I called the EP scheduler and left a message to have them call you and schedule follow up appointment  You have been referred to pulmonary and will be called for appointment  Your physician recommends that you schedule a follow-up appointment in:  2 months in clinic  4 months with Dr. Aundra Dubin    Do the following things EVERYDAY: Weigh yourself in the morning before breakfast. Write it down and keep it in a log. Take your medicines as prescribed Eat low salt foods--Limit salt (sodium) to 2000 mg per day.  Stay as active as you can everyday Limit all fluids for the day to less than 2 liters  At the [facility], you and your health needs are our priority. As part of our continuing mission to provide you with exceptional heart care, we have created designated Provider Care Teams. These Care Teams include your primary Cardiologist (physician) and Advanced Practice Providers (APPs- Physician Assistants and Nurse Practitioners) who all work together to provide you with the care you need, when you need it.   You may see any of the following providers on your designated Care Team at your next follow up: Dr Glori Bickers Dr Loralie Champagne Dr. Roxana Hires, NP Lyda Jester, [place] [facility] [place], [place] Forestine Na, NP Audry Riles, PharmD   Please be sure to bring in all your medications bottles to every appointment.   If you have any questions or concerns before your next appointment please send [place] a message through [place] or call our office at [phone].    TO LEAVE A MESSAGE FOR THE NURSE SELECT OPTION 2, PLEASE LEAVE A MESSAGE INCLUDING: YOUR NAME DATE OF BIRTH CALL BACK NUMBER REASON FOR CALL**this is important as we prioritize the call backs  YOU WILL RECEIVE A CALL BACK THE SAME DAY AS LONG AS YOU CALL  BEFORE 4:00 PM

## 2022-06-07 NOTE — Progress Notes (Signed)
ReDS Vest / Clip - 06/07/22 1200       ReDS Vest / Clip   Station Marker A    Ruler Value 25    ReDS Value Range Low volume    ReDS Actual Value 32

## 2022-06-07 NOTE — Addendum Note (Signed)
Encounter addended by: Payton Mccallum, RN on: 06/07/2022 3:26 PM  Actions taken: Order list changed, Diagnosis association updated

## 2022-06-13 NOTE — Progress Notes (Unsigned)
Cardiology Office Note Date:  06/14/2022  Patient ID:  Stacey Hartman, Stacey Hartman Apr 14, 1956, MRN [id number] PCP:  Vidal Schwalbe, MD  Cardiologist:  Dr. Aundra Dubin Electrophysiologist: Dr. Caryl Comes    Chief Complaint:  discuss CRT-D  History of Present Illness: Stacey Hartman is a 66 y.o. female with history of COPD (former smoker), chronic CHF (systolic), hypothyroidism, LBBB, stroke, Raynaud's  --Referred to AHF team for DOE, Echo 2/23 EF < 20% RV normal.   --Sent to ED from cardiology office after echo showed EF < 20%. With c/o CP and SOB in waiting room/triage and taken for urgent R/LHC.  --Cardiac cath with no significant CAD, normal PA and PCWP, LVEDP 28 mmHg, CO preserved.   --cMRI with LVEF 12%, no myocardial LGE, elevated T1 indices, ECV 42% in basal septum.  Developed acute left sided facial numbness during cMRI. Code stroke called.  --MRI brain with punctate acute infarct in high right frontal cortex.  No AF detected during admit.  --30-day monitor arranged to evaluate for arrhythmias, showed no AF either.  --PYP obtained and equivocal for transthyretin amyloidosis (grade 1, H/CL 1.47).   TTR gene testing negative  Echo 5/23, EF remained < 20%, moderate LV dilation, normal RV, IVC normal  6/23 with Dr. Caryl Comes to discuss CRT-D. Concern for on-going weight loss and CT chest A/P ordered to rule out malignancy before proceeding with CRT. This showed rectal wall thickening, concerning for malignancy, however no other findings to explain weight loss.  She saw GI, noted to have an unremarkable c-scope about a year ago.  GI thinks unlikely malignancy but has arranged for sigmoidoscopy.   She saw Dr. Aundra Dubin 05/10/22, a little more SOB, winded with stairs, long walks, + orthonea is chronic Meds adjusted, did not think BP would tolerate Entresto  05/24/22 flex sigmoidoscopy The entire examined colon is normal. - No specimens collected. Normal rectum. CT artifact. No source seen for weight  loss, which may be due to the patient's advanced heart failure.  She saw L. Suzie Portela, PA-C 06/07/22, discussed progressive DOE, slight worsening exertional capacity, not acutely volume OL, no med changes made.  TODAY She is doing OK. No CP, palpitations, cardiac awareness No rest SOB, DOE is at her baseline, perhaps with some progression though. No near syncope or syncope. She is eager to proceed with implant   Past Medical History:  Diagnosis Date   Anxiety    Chronic systolic CHF (congestive heart failure), NYHA class 3 (HCC)    Fibromyalgia    Hypothyroid    LBBB (left bundle branch block)    NICM (nonischemic cardiomyopathy) (HCC)    Thyroid disease    Weight loss, non-intentional     Past Surgical History:  Procedure Laterality Date   ANKLE SURGERY Right    CHOLECYSTECTOMY     FLEXIBLE SIGMOIDOSCOPY N/A 05/24/2022   Procedure: FLEXIBLE SIGMOIDOSCOPY;  Surgeon: Doran Stabler, MD;  Location: [facility];  Service: Gastroenterology;  Laterality: N/A;  NO SEDATION   RIGHT/LEFT HEART CATH AND CORONARY ANGIOGRAPHY N/A 10/05/2021   Procedure: RIGHT/LEFT HEART CATH AND CORONARY ANGIOGRAPHY;  Surgeon: Larey Dresser, MD;  Location: [facility];  Service: Cardiovascular;  Laterality: N/A;   TUBAL LIGATION      Current Outpatient Medications  Medication Sig Dispense Refill   atorvastatin (LIPITOR) 40 MG tablet Take 1 tablet (40 mg total) by mouth daily. 90 tablet 3   carboxymethylcellulose (REFRESH TEARS) 0.5 % SOLN Place 1 drop into both  eyes 3 (three) times daily as needed (dry eyes).     clonazePAM (KLONOPIN) 0.5 MG tablet Take 0.5 mg by mouth 2 (two) times daily.     clopidogrel (PLAVIX) 75 MG tablet Take 1 tablet (75 mg total) by mouth daily. 30 tablet 6   dapagliflozin propanediol (FARXIGA) 10 MG TABS tablet Take 1 tablet (10 mg total) by mouth daily. 90 tablet 3   furosemide (LASIX) 20 MG tablet Take 1 tablet (20 mg total) by mouth daily. 90 tablet 3    gabapentin (NEURONTIN) 300 MG capsule Take 300 mg by mouth at bedtime.     levothyroxine (SYNTHROID) 150 MCG tablet TAKE (1) TABLET BY MOUTH DAILY BEFORE BREAKFAST. 30 tablet 11   losartan (COZAAR) 25 MG tablet Take 0.5 tablets (12.5 mg total) by mouth at bedtime. 15 tablet 5   metoprolol succinate (TOPROL-XL) 25 MG 24 hr tablet Take 1 tablet (25 mg total) by mouth at bedtime. Take with or immediately following a meal. 90 tablet 3   spironolactone (ALDACTONE) 25 MG tablet Take 1 tablet (25 mg total) by mouth daily. 90 tablet 3   No current facility-administered medications for this visit.    Allergies:   Penicillins and Sulfa antibiotics   Social History:  The patient  reports that she has quit smoking. Her smoking use included cigarettes. She has been exposed to tobacco smoke. She has never used smokeless tobacco. She reports that she does not currently use alcohol. She reports that she does not use drugs.   Family History:  The patient's family history includes Colon cancer in her maternal grandmother; Healthy in her son; Heart attack in her father; Heart disease in her father; Heart failure in her brother; Melanoma in her brother, mother, and sister; Stomach cancer in her maternal uncle.  ROS:  Please see the history of present illness.    All other systems are reviewed and otherwise negative.   PHYSICAL EXAM:  VS:  BP (!) 90/50   Pulse 80   Ht '5\' 3"'$  (1.6 m)   Wt 113 lb 12.8 oz (51.6 kg)   LMP  (LMP Unknown)   SpO2 97%   BMI 20.16 kg/m  BMI: Body mass index is 20.16 kg/m. Well nourished, well developed, in no acute distress HEENT: normocephalic, atraumatic Neck: no JVD, carotid bruits or masses Cardiac:   RRR; no significant murmurs, no rubs, or gallops Lungs:   CTA b/l, no wheezing, rhonchi or rales Abd: soft, nontender MS: no deformity or  atrophy Ext:  no edema Skin: warm and dry, no rash Neuro:  No gross deficits appreciated Psych: euthymic mood, full affect   EKG:   Done today and reviewed by myself shows  SR 72bpm, LBBB, LAD    Recent Labs: 10/05/2021: ALT 18; TSH 18.030 10/06/2021: Magnesium 2.2 10/09/2021: Hemoglobin 12.3; Platelets 160 06/07/2022: B Natriuretic Peptide 442.0; BUN 13; Creatinine, Ser 0.70; Potassium 3.6; Sodium 141  01/26/2022: Cholesterol 114; HDL 47; LDL Cholesterol 48; Total CHOL/HDL Ratio 2.4; Triglycerides 93; VLDL 19   Estimated Creatinine Clearance: 56.3 mL/min (by C-G formula based on SCr of 0.7 mg/dL).   Wt Readings from Last 3 Encounters:  06/14/22 113 lb 12.8 oz (51.6 kg)  06/07/22 113 lb 12.8 oz (51.6 kg)  05/24/22 114 lb 13.8 oz (52.1 kg)     Other studies reviewed: Additional studies/records reviewed today include: summarized above  ASSESSMENT AND PLAN:  NICM LBBB Chronic CHF (systolic) We revisited rational for ICD/CRT therapy Revisited implant procedure, potential risks/benefits  She would like to proceed  No plavix 3 days ahead  Disposition: Usual post procedure follow up, sooner if needed  Current medicines are reviewed at length with the patient today.  The patient did not have any concerns regarding medicines.  Venetia Night, PA-C 06/14/2022 12:49 PM     [address] [place]  [place] [id number] [phone] (office)  [phone] (fax)

## 2022-06-13 NOTE — H&P (View-Only) (Signed)
Cardiology Office Note Date:  06/14/2022  Patient ID:  Stacey Hartman 12-02-1955, MRN [id number] PCP:  Vidal Schwalbe, MD  Cardiologist:  Dr. Aundra Dubin Electrophysiologist: Dr. Caryl Comes    Chief Complaint:  discuss CRT-D  History of Present Illness: Stacey Hartman is a 66 y.o. female with history of COPD (former smoker), chronic CHF (systolic), hypothyroidism, LBBB, stroke, Raynaud's  --Referred to AHF team for DOE, Echo 2/23 EF < 20% RV normal.   --Sent to ED from cardiology office after echo showed EF < 20%. With c/o CP and SOB in waiting room/triage and taken for urgent R/LHC.  --Cardiac cath with no significant CAD, normal PA and PCWP, LVEDP 28 mmHg, CO preserved.   --cMRI with LVEF 12%, no myocardial LGE, elevated T1 indices, ECV 42% in basal septum.  Developed acute left sided facial numbness during cMRI. Code stroke called.  --MRI brain with punctate acute infarct in high right frontal cortex.  No AF detected during admit.  --30-day monitor arranged to evaluate for arrhythmias, showed no AF either.  --PYP obtained and equivocal for transthyretin amyloidosis (grade 1, H/CL 1.47).   TTR gene testing negative  Echo 5/23, EF remained < 20%, moderate LV dilation, normal RV, IVC normal  6/23 with Dr. Caryl Comes to discuss CRT-D. Concern for on-going weight loss and CT chest A/P ordered to rule out malignancy before proceeding with CRT. This showed rectal wall thickening, concerning for malignancy, however no other findings to explain weight loss.  She saw GI, noted to have an unremarkable c-scope about a year ago.  GI thinks unlikely malignancy but has arranged for sigmoidoscopy.   She saw Dr. Aundra Dubin 05/10/22, a little more SOB, winded with stairs, long walks, + orthonea is chronic Meds adjusted, did not think BP would tolerate Entresto  05/24/22 flex sigmoidoscopy The entire examined colon is normal. - No specimens collected. Normal rectum. CT artifact. No source seen for weight  loss, which may be due to the patient's advanced heart failure.  She saw L. Suzie Portela, PA-C 06/07/22, discussed progressive DOE, slight worsening exertional capacity, not acutely volume OL, no med changes made.  TODAY She is doing OK. No CP, palpitations, cardiac awareness No rest SOB, DOE is at her baseline, perhaps with some progression though. No near syncope or syncope. She is eager to proceed with implant   Past Medical History:  Diagnosis Date   Anxiety    Chronic systolic CHF (congestive heart failure), NYHA class 3 (HCC)    Fibromyalgia    Hypothyroid    LBBB (left bundle branch block)    NICM (nonischemic cardiomyopathy) (HCC)    Thyroid disease    Weight loss, non-intentional     Past Surgical History:  Procedure Laterality Date   ANKLE SURGERY Right    CHOLECYSTECTOMY     FLEXIBLE SIGMOIDOSCOPY N/A 05/24/2022   Procedure: FLEXIBLE SIGMOIDOSCOPY;  Surgeon: Doran Stabler, MD;  Location: [facility];  Service: Gastroenterology;  Laterality: N/A;  NO SEDATION   RIGHT/LEFT HEART CATH AND CORONARY ANGIOGRAPHY N/A 10/05/2021   Procedure: RIGHT/LEFT HEART CATH AND CORONARY ANGIOGRAPHY;  Surgeon: Larey Dresser, MD;  Location: [facility];  Service: Cardiovascular;  Laterality: N/A;   TUBAL LIGATION      Current Outpatient Medications  Medication Sig Dispense Refill   atorvastatin (LIPITOR) 40 MG tablet Take 1 tablet (40 mg total) by mouth daily. 90 tablet 3   carboxymethylcellulose (REFRESH TEARS) 0.5 % SOLN Place 1 drop into both  eyes 3 (three) times daily as needed (dry eyes).     clonazePAM (KLONOPIN) 0.5 MG tablet Take 0.5 mg by mouth 2 (two) times daily.     clopidogrel (PLAVIX) 75 MG tablet Take 1 tablet (75 mg total) by mouth daily. 30 tablet 6   dapagliflozin propanediol (FARXIGA) 10 MG TABS tablet Take 1 tablet (10 mg total) by mouth daily. 90 tablet 3   furosemide (LASIX) 20 MG tablet Take 1 tablet (20 mg total) by mouth daily. 90 tablet 3    gabapentin (NEURONTIN) 300 MG capsule Take 300 mg by mouth at bedtime.     levothyroxine (SYNTHROID) 150 MCG tablet TAKE (1) TABLET BY MOUTH DAILY BEFORE BREAKFAST. 30 tablet 11   losartan (COZAAR) 25 MG tablet Take 0.5 tablets (12.5 mg total) by mouth at bedtime. 15 tablet 5   metoprolol succinate (TOPROL-XL) 25 MG 24 hr tablet Take 1 tablet (25 mg total) by mouth at bedtime. Take with or immediately following a meal. 90 tablet 3   spironolactone (ALDACTONE) 25 MG tablet Take 1 tablet (25 mg total) by mouth daily. 90 tablet 3   No current facility-administered medications for this visit.    Allergies:   Penicillins and Sulfa antibiotics   Social History:  The patient  reports that she has quit smoking. Her smoking use included cigarettes. She has been exposed to tobacco smoke. She has never used smokeless tobacco. She reports that she does not currently use alcohol. She reports that she does not use drugs.   Family History:  The patient's family history includes Colon cancer in her maternal grandmother; Healthy in her son; Heart attack in her father; Heart disease in her father; Heart failure in her brother; Melanoma in her brother, mother, and sister; Stomach cancer in her maternal uncle.  ROS:  Please see the history of present illness.    All other systems are reviewed and otherwise negative.   PHYSICAL EXAM:  VS:  BP (!) 90/50   Pulse 80   Ht '5\' 3"'$  (1.6 m)   Wt 113 lb 12.8 oz (51.6 kg)   LMP  (LMP Unknown)   SpO2 97%   BMI 20.16 kg/m  BMI: Body mass index is 20.16 kg/m. Well nourished, well developed, in no acute distress HEENT: normocephalic, atraumatic Neck: no JVD, carotid bruits or masses Cardiac:   RRR; no significant murmurs, no rubs, or gallops Lungs:   CTA b/l, no wheezing, rhonchi or rales Abd: soft, nontender MS: no deformity or  atrophy Ext:  no edema Skin: warm and dry, no rash Neuro:  No gross deficits appreciated Psych: euthymic mood, full affect   EKG:   Done today and reviewed by myself shows  SR 72bpm, LBBB, LAD    Recent Labs: 10/05/2021: ALT 18; TSH 18.030 10/06/2021: Magnesium 2.2 10/09/2021: Hemoglobin 12.3; Platelets 160 06/07/2022: B Natriuretic Peptide 442.0; BUN 13; Creatinine, Ser 0.70; Potassium 3.6; Sodium 141  01/26/2022: Cholesterol 114; HDL 47; LDL Cholesterol 48; Total CHOL/HDL Ratio 2.4; Triglycerides 93; VLDL 19   Estimated Creatinine Clearance: 56.3 mL/min (by C-G formula based on SCr of 0.7 mg/dL).   Wt Readings from Last 3 Encounters:  06/14/22 113 lb 12.8 oz (51.6 kg)  06/07/22 113 lb 12.8 oz (51.6 kg)  05/24/22 114 lb 13.8 oz (52.1 kg)     Other studies reviewed: Additional studies/records reviewed today include: summarized above  ASSESSMENT AND PLAN:  NICM LBBB Chronic CHF (systolic) We revisited rational for ICD/CRT therapy Revisited implant procedure, potential risks/benefits  She would like to proceed  No plavix 3 days ahead  Disposition: Usual post procedure follow up, sooner if needed  Current medicines are reviewed at length with the patient today.  The patient did not have any concerns regarding medicines.  Venetia Night, PA-C 06/14/2022 12:49 PM     [place] [address] [phone] (office)  [phone] (fax)

## 2022-06-14 ENCOUNTER — Ambulatory Visit: Payer: 59 | Attending: Physician Assistant | Admitting: Physician Assistant

## 2022-06-14 ENCOUNTER — Encounter: Payer: Self-pay | Admitting: *Deleted

## 2022-06-14 ENCOUNTER — Encounter: Payer: Self-pay | Admitting: Physician Assistant

## 2022-06-14 VITALS — BP 90/50 | HR 80 | Ht 63.0 in | Wt 113.8 lb

## 2022-06-14 DIAGNOSIS — I5022 Chronic systolic (congestive) heart failure: Secondary | ICD-10-CM | POA: Diagnosis not present

## 2022-06-14 DIAGNOSIS — I447 Left bundle-branch block, unspecified: Secondary | ICD-10-CM | POA: Diagnosis not present

## 2022-06-14 DIAGNOSIS — Z01818 Encounter for other preprocedural examination: Secondary | ICD-10-CM | POA: Diagnosis not present

## 2022-06-14 DIAGNOSIS — I428 Other cardiomyopathies: Secondary | ICD-10-CM

## 2022-06-14 NOTE — Patient Instructions (Addendum)
Medication Instructions:   Your physician recommends that you continue on your current medications as directed. Please refer to the Current Medication list given to you today.  ( FOLLOW MEDICATION INSTRUCTIONS FOR  PROCEDURE ON LETTER ATTACHED)  *If you need a refill on your cardiac medications before your next appointment, please call your pharmacy*   Lab Work:  [place]   If you have labs (blood work) drawn today and your tests are completely normal, you will receive your results only by: [place] (if you have MyChart) OR A paper copy in the mail If you have any lab test that is abnormal or we need to change your treatment, we will call you to review the results.   Testing/Procedures:  SEE LETTER FOR ICD IMPLANT    Follow-Up: At [facility], you and your health needs are our priority.  As part of our continuing mission to provide you with exceptional heart care, we have created designated Provider Care Teams.  These Care Teams include your primary Cardiologist (physician) and Advanced Practice Providers (APPs -  Physician Assistants and Nurse Practitioners) who all work together to provide you with the care you need, when you need it.  We recommend signing up for the patient portal called "MyChart".  Sign up information is provided on this After Visit Summary.  MyChart is used to connect with patients for Virtual Visits (Telemedicine).  Patients are able to view lab/test results, encounter notes, upcoming appointments, etc.  Non-urgent messages can be sent to your provider as well.   To learn more about what you can do with MyChart, go to [name].    Your next appointment:  AFTER 06-21-22  10-14 DAYS WITH DEVICE CLINIC FOR  WOUND VISIT   3 month(s)  The format for your next appointment:   In Person  Provider:   Virl Axe, MD  PHYS DEFIB [facility]   Other Instructions Cardioverter Defibrillator Implantation An implantable cardioverter  defibrillator (ICD) is a device that identifies and corrects abnormal heart rhythms. Cardioverter defibrillator implantation is a surgery to place an ICD under the skin in the chest or abdomen. An ICD has a battery, a small computer (pulse generator), and wires (leads) that go into the heart. The ICD detects and corrects two types of dangerous irregular heart rhythms (arrhythmias): A rapid heart rhythm in the lower chambers of the heart (ventricles). This is called ventricular tachycardia. The ventricles contracting in an uncoordinated way. This is called ventricular fibrillation. There are different types of ICDs, and the electrical signals from the ICD can be programmed differently based on the condition being treated. The electrical signals from the ICD can be low-energy pulses, high-energy shocks, or a combination of the two. The low-energy pulses are generally used to restore the heartbeat to normal when it is either too slow (bradycardia) or too fast. These pulses are painless. The high-energy shocks are used to treat abnormal rhythms such as ventricular tachycardia or ventricular fibrillation. This shock may feel like a strong jolt in the chest. Your health care provider may recommend an ICD if you have: Had a ventricular arrhythmia in the past. A damaged heart because of a disease or heart condition. A weakened heart muscle from a heart attack or cardiac arrest. A congenital heart defect. Long QT syndrome, which is a disorder of the heart's electrical system. Brugada syndrome, which is a condition that causes a disruption of the heart's normal rhythm. Tell a health care provider about: Any allergies you  have. All medicines you are taking, including vitamins, herbs, eye drops, creams, and over-the-counter medicines. Any problems you or family members have had with anesthetic medicines. Any blood disorders you have. Any surgeries you have had. Any medical conditions you have. Whether you are  pregnant or may be pregnant. What are the risks? Generally, this is a safe procedure. However, problems may occur, including: Infection. Bleeding. Allergic reactions to medicines used during the procedure. Blood clots. Swelling or bruising. Damage to nearby structures or organs, such as nerves, lungs, blood vessels, or the heart where the ICD leads or pulse generator is implanted. What happens before the procedure? Staying hydrated Follow instructions from your health care provider about hydration, which may include: Up to 2 hours before the procedure - you may continue to drink clear liquids, such as water, clear fruit juice, black coffee, and plain tea.  Eating and drinking restrictions Follow instructions from your health care provider about eating and drinking, which may include: 8 hours before the procedure - stop eating heavy meals or foods, such as meat, fried foods, or fatty foods. 6 hours before the procedure - stop eating light meals or foods, such as toast or cereal. 6 hours before the procedure - stop drinking milk or drinks that contain milk. 2 hours before the procedure - stop drinking clear liquids. Medicines Ask your health care provider about: Changing or stopping your regular medicines. This is especially important if you are taking diabetes medicines or blood thinners. Taking medicines such as aspirin and ibuprofen. These medicines can thin your blood. Do not take these medicines unless your health care provider tells you to take them. Taking over-the-counter medicines, vitamins, herbs, and supplements. Tests You may have an exam or testing. These may include: Blood tests. A test to check the electrical signals in your heart (electrocardiogram, ECG). Imaging tests, such as a chest X-ray. Echocardiogram. This is an ultrasound of your heart to evaluate your heart structures and function. An event monitor or Holter monitor to wear at home. General instructions Do not  use any products that contain nicotine or tobacco for at least 4 weeks before the procedure. These products include cigarettes, chewing tobacco, and vaping devices, such as e-cigarettes. If you need help quitting, ask your health care provider. Ask your health care provider: How your procedure site will be marked. What steps will be taken to help prevent infection. These may include: Removing hair at the surgery site. Washing skin with a germ-killing soap. Taking antibiotic medicine. You may be asked to shower with a germ-killing soap. Plan to have a responsible adult take you home from the hospital or clinic. What happens during the procedure?  Small monitors will be put on your body. They will be used to check your heart rate, blood pressure, and oxygen level. A pair of sticky pads (defibrillator pads) may be placed on your back and chest. These pads are able to pace your heart as needed during the procedure. An IV will be inserted into one of your veins. You will be given one or more of the following: A medicine to help you relax (sedative). A medicine to numb the area (local anesthetic). A medicine to make you fall asleep(general anesthetic). A small incision will be made to create a deep pocket under the skin of your chest or abdomen. Leads will be guided through a blood vessel into your heart and attached to your heart muscles. Depending on the ICD, the leads may go into one ventricle, or  they may go into both ventricles and into an upper chamber of the heart. An X-ray machine (fluoroscope) will be used to help guide the leads. The other end of the leads will be attached to the pulse generator. The pulse generator will be placed into the pocket under the skin. The ICD will be tested, and your health care provider will program the ICD for the condition being treated. The incision will be closed with stitches (sutures), skin glue, adhesive strips, or staples. A bandage (dressing) will be  placed over the incision. The procedure may vary among health care providers and hospitals. What happens after the procedure? Your blood pressure, heart rate, breathing rate, and blood oxygen level will be monitored until you leave the hospital or clinic. Your health care provider will also monitor your ICD to make sure it is working properly. A chest X-ray will be taken to check that the ICD is in the right place. Do not raise the arm on the side of your procedure higher than your shoulder for as long as told by your health care provider. This is usually at least 6 weeks. You may be given an identification card explaining that you have an ICD. You will be given a remote home monitoring device to use with your ICD to allow your device to communicate with your clinic. Summary An implantable cardioverter defibrillator (ICD) is a device that identifies and corrects abnormal heart rhythms. Cardioverter defibrillator implantation is a surgery to place an ICD under the skin in the chest or abdomen. An ICD consists of a battery, a small computer (pulse generator), and wires (leads) that go into the heart. During the procedure, the ICD will be tested, and your health care provider will program the ICD for the condition being treated. After the procedure, a chest X-ray will be taken to check that the ICD is in the right place. This information is not intended to replace advice given to you by your health care provider. Make sure you discuss any questions you have with your health care provider. Document Revised: 02/13/2020 Document Reviewed: 02/13/2020 Elsevier Patient Education  [place]

## 2022-06-15 LAB — CBC
Hematocrit: 45.5 % (ref 34.0–46.6)
Hemoglobin: 15.3 g/dL (ref 11.1–15.9)
MCH: 32 pg (ref 26.6–33.0)
MCHC: 33.6 g/dL (ref 31.5–35.7)
MCV: 95 fL (ref 79–97)
Platelets: 212 10*3/uL (ref 150–450)
RBC: 4.78 x10E6/uL (ref 3.77–5.28)
RDW: 13.1 % (ref 11.7–15.4)
WBC: 8.7 10*3/uL (ref 3.4–10.8)

## 2022-06-15 LAB — BASIC METABOLIC PANEL
BUN/Creatinine Ratio: 19 (ref 12–28)
BUN: 18 mg/dL (ref 8–27)
CO2: 26 mmol/L (ref 20–29)
Calcium: 10 mg/dL (ref 8.7–10.3)
Chloride: 100 mmol/L (ref 96–106)
Creatinine, Ser: 0.93 mg/dL (ref 0.57–1.00)
Glucose: 96 mg/dL (ref 70–99)
Potassium: 4.5 mmol/L (ref 3.5–5.2)
Sodium: 140 mmol/L (ref 134–144)
eGFR: 68 mL/min/{1.73_m2} (ref >59)

## 2022-06-15 NOTE — Addendum Note (Signed)
Addended by: Drue Novel I on: 06/15/2022 07:24 PM   Modules accepted: Orders

## 2022-06-21 ENCOUNTER — Ambulatory Visit (HOSPITAL_COMMUNITY)
Admission: RE | Admit: 2022-06-21 | Discharge: 2022-06-21 | Disposition: A | Discharge status: Home / Self Care | Payer: 59 | Attending: Internal Medicine | Admitting: Internal Medicine

## 2022-06-21 ENCOUNTER — Telehealth: Payer: Self-pay | Admitting: Internal Medicine

## 2022-06-21 ENCOUNTER — Encounter (HOSPITAL_COMMUNITY)
Admission: RE | Disposition: A | Discharge status: Home / Self Care | Payer: Self-pay | Source: Home / Self Care | Attending: Internal Medicine

## 2022-06-21 ENCOUNTER — Other Ambulatory Visit: Payer: Self-pay

## 2022-06-21 ENCOUNTER — Ambulatory Visit (HOSPITAL_COMMUNITY): Payer: 59

## 2022-06-21 DIAGNOSIS — I7300 Raynaud's syndrome without gangrene: Secondary | ICD-10-CM | POA: Diagnosis not present

## 2022-06-21 DIAGNOSIS — I509 Heart failure, unspecified: Secondary | ICD-10-CM | POA: Diagnosis not present

## 2022-06-21 DIAGNOSIS — Z8673 Personal history of transient ischemic attack (TIA), and cerebral infarction without residual deficits: Secondary | ICD-10-CM | POA: Insufficient documentation

## 2022-06-21 DIAGNOSIS — I428 Other cardiomyopathies: Secondary | ICD-10-CM | POA: Insufficient documentation

## 2022-06-21 DIAGNOSIS — I447 Left bundle-branch block, unspecified: Secondary | ICD-10-CM | POA: Diagnosis not present

## 2022-06-21 DIAGNOSIS — I5022 Chronic systolic (congestive) heart failure: Secondary | ICD-10-CM | POA: Diagnosis present

## 2022-06-21 DIAGNOSIS — E039 Hypothyroidism, unspecified: Secondary | ICD-10-CM | POA: Diagnosis not present

## 2022-06-21 DIAGNOSIS — J449 Chronic obstructive pulmonary disease, unspecified: Secondary | ICD-10-CM | POA: Diagnosis not present

## 2022-06-21 HISTORY — PX: BIV ICD INSERTION CRT-D: EP1195

## 2022-06-21 SURGERY — BIV ICD INSERTION CRT-D

## 2022-06-21 MED ORDER — HEPARIN (PORCINE) IN NACL 1000-0.9 UT/500ML-% IV SOLN
INTRAVENOUS | Status: DC | PRN
  Administered 2022-06-21: 500 mL

## 2022-06-21 MED ORDER — SODIUM CHLORIDE 0.9 % IV SOLN
80.0000 mg | INTRAVENOUS | Status: AC
  Administered 2022-06-21: 80 mg

## 2022-06-21 MED ORDER — FENTANYL CITRATE (PF) 100 MCG/2ML IJ SOLN
INTRAMUSCULAR | Status: DC | PRN
  Administered 2022-06-21 (×4): 25 ug via INTRAVENOUS

## 2022-06-21 MED ORDER — LIDOCAINE HCL (PF) 1 % IJ SOLN
INTRAMUSCULAR | Status: DC | PRN
  Administered 2022-06-21: 60 mL

## 2022-06-21 MED ORDER — ONDANSETRON HCL 4 MG/2ML IJ SOLN: 4.0000 mg | Freq: Four times a day (QID) | INTRAMUSCULAR | Status: DC | PRN

## 2022-06-21 MED ORDER — MIDAZOLAM HCL 5 MG/5ML IJ SOLN
INTRAMUSCULAR | Status: DC | PRN
  Administered 2022-06-21 (×4): 1 mg via INTRAVENOUS

## 2022-06-21 MED ORDER — SODIUM CHLORIDE 0.9 % IV SOLN
INTRAVENOUS | Status: AC
  Filled 2022-06-21: qty 2

## 2022-06-21 MED ORDER — CHLORHEXIDINE GLUCONATE 4 % EX LIQD
4.0000 | Freq: Once | CUTANEOUS | Status: DC
  Filled 2022-06-21: qty 60

## 2022-06-21 MED ORDER — FENTANYL CITRATE (PF) 100 MCG/2ML IJ SOLN
INTRAMUSCULAR | Status: AC
  Filled 2022-06-21: qty 2

## 2022-06-21 MED ORDER — POVIDONE-IODINE 10 % EX SWAB
2.0000 | Freq: Once | CUTANEOUS | Status: AC
  Administered 2022-06-21: 2 via TOPICAL

## 2022-06-21 MED ORDER — MIDAZOLAM HCL 5 MG/5ML IJ SOLN
INTRAMUSCULAR | Status: AC
  Filled 2022-06-21: qty 5

## 2022-06-21 MED ORDER — LIDOCAINE HCL (PF) 1 % IJ SOLN
INTRAMUSCULAR | Status: AC
  Filled 2022-06-21: qty 60

## 2022-06-21 MED ORDER — ACETAMINOPHEN 325 MG PO TABS
325.0000 mg | ORAL_TABLET | ORAL | Status: DC | PRN
  Administered 2022-06-21: 650 mg via ORAL
  Filled 2022-06-21: qty 2

## 2022-06-21 MED ORDER — SODIUM CHLORIDE 0.9 % IV SOLN: INTRAVENOUS | Status: DC

## 2022-06-21 MED ORDER — VANCOMYCIN HCL IN DEXTROSE 1-5 GM/200ML-% IV SOLN
1000.0000 mg | INTRAVENOUS | Status: AC
  Administered 2022-06-21: 1000 mg via INTRAVENOUS

## 2022-06-21 MED ORDER — VANCOMYCIN HCL IN DEXTROSE 1-5 GM/200ML-% IV SOLN
INTRAVENOUS | Status: AC
  Filled 2022-06-21: qty 200

## 2022-06-21 SURGICAL SUPPLY — 16 items
ASSURA CRTD CD3369-40C (ICD Generator) ×1 IMPLANT
CABLE SURGICAL S-101-97-12 (CABLE) ×1 IMPLANT
CATH CPS DIRECT 115 DS2C019 (CATHETERS) IMPLANT
DEFIB ASSURA CRT-D (ICD Generator) IMPLANT
KIT ESSENTIALS PG (KITS) IMPLANT
LEAD DURATA 7122-60CM (Lead) IMPLANT
LEAD QUARTET 1456Q-86 (Lead) IMPLANT
LEAD ULTIPACE 52 LPA1231/52 (Lead) IMPLANT
PAD DEFIB RADIO PHYSIO CONN (PAD) ×1 IMPLANT
QUARTET 1456Q-86 (Lead) ×1 IMPLANT
SHEATH 7FR PRELUDE SNAP 13 (SHEATH) IMPLANT
SHEATH 9.5FR PRELUDE SNAP 13 (SHEATH) IMPLANT
SLITTER AGILIS HISPRO (INSTRUMENTS) IMPLANT
TRAY PACEMAKER INSERTION (PACKS) ×1 IMPLANT
WIRE ACUITY WHISPER EDS 4648 (WIRE) IMPLANT
WIRE HI TORQ VERSACORE-J 145CM (WIRE) IMPLANT

## 2022-06-21 NOTE — Telephone Encounter (Signed)
Ov asap with all meds in hand  

## 2022-06-21 NOTE — Telephone Encounter (Signed)
ATC patient to schedule an ov. LMTCB

## 2022-06-21 NOTE — Discharge Instructions (Addendum)
After Your ICD (Implantable Cardiac Defibrillator)   You have a [employer] ICD  ACTIVITY Do not lift your arm above shoulder height for 1 week after your procedure. After 7 days, you may progress as below.  You should remove your sling 24 hours after your procedure, unless otherwise instructed by your provider.     Monday June 28, 2022  Tuesday June 29, 2022 Wednesday June 30, 2022 Thursday July 01, 2022   Do not lift, push, pull, or carry anything over 10 pounds with the affected arm until 6 weeks (Monday August 02, 2022 ) after your procedure.   You may drive AFTER your wound check, unless you have been told otherwise by your provider.   Ask your healthcare provider when you can go back to work   INCISION/Dressing Do not stop plavix, continue as prescribed  If large square, outer bandage is left in place, this can be removed after 24 hours from your procedure. Do not remove steri-strips or glue as below.   Monitor your defibrillator site for redness, swelling, and drainage. Call the device clinic at [phone] if you experience these symptoms or fever/chills.  If your incision is closed with Dermabond/Surgical glue. You may shower 1 day after your pacemaker implant and wash around the site with soap and water.    If you were discharged in a sling, please do not wear this during the day more than 48 hours after your surgery unless otherwise instructed. This may increase the risk of stiffness and soreness in your shoulder.   Avoid lotions, ointments, or perfumes over your incision until it is well-healed.  You may use a hot tub or a pool AFTER your wound check appointment if the incision is completely closed.  Your ICD is designed to protect you from life threatening heart rhythms. Because of this, you may receive a shock.   1 shock with no symptoms:  Call the office during business hours. 1 shock with symptoms (chest pain, chest pressure, dizziness, lightheadedness,  shortness of breath, overall feeling unwell):  Call 911. If you experience 2 or more shocks in 24 hours:  Call 911. If you receive a shock, you should not drive for 6 months per the [place] DMV IF you receive appropriate therapy from your ICD.   ICD Alerts:  Some alerts are vibratory and others beep. These are NOT emergencies. Please call our office to let us know. If this occurs at night or on weekends, it can wait until the next business day. Send a remote transmission.  If your device is capable of reading fluid status (for heart failure), you will be offered monthly monitoring to review this with you.   DEVICE MANAGEMENT Remote monitoring is used to monitor your ICD from home. This monitoring is scheduled every 91 days by our office. It allows [place] to keep an eye on the functioning of your device to ensure it is working properly. You will routinely see your Electrophysiologist annually (more often if necessary).   You should receive your ID card for your new device in 4-8 weeks. Keep this card with you at all times once received. Consider wearing a medical alert bracelet or necklace.  Your ICD  may be MRI compatible. This will be discussed at your next office visit/wound check.  You should avoid contact with strong electric or magnetic fields.   Do not use amateur (ham) radio equipment or electric (arc) welding torches. MP3 player headphones with magnets should not be used. Some devices are  safe to use if held at least 12 inches (30 cm) from your defibrillator. These include power tools, lawn mowers, and speakers. If you are unsure if something is safe to use, ask your health care provider.  When using your cell phone, hold it to the ear that is on the opposite side from the defibrillator. Do not leave your cell phone in a pocket over the defibrillator.  You may safely use electric blankets, heating pads, computers, and microwave ovens.  Call the office right away if: You have chest pain. You  feel more than one shock. You feel more short of breath than you have felt before. You feel more light-headed than you have felt before. Your incision starts to open up.  This information is not intended to replace advice given to you by your health care provider. Make sure you discuss any questions you have with your health care provider.

## 2022-06-21 NOTE — Interval H&P Note (Signed)
History and Physical Interval Note:  06/21/2022 9:22 AM  Stacey Hartman  has presented today for surgery, with the diagnosis of congestive hf, cardiomyopathy.  The various methods of treatment have been discussed with the patient and family. After consideration of risks, benefits and other options for treatment, the patient has consented to  Procedure(s): BIV ICD INSERTION CRT-D (N/A) as a surgical intervention.  The patient's history has been reviewed, patient examined, no change in status, stable for surgery.  I have reviewed the patient's chart and labs.  Questions were answered to the patient's satisfaction.     Virl Axe

## 2022-06-22 ENCOUNTER — Encounter (HOSPITAL_COMMUNITY): Payer: Self-pay | Admitting: Internal Medicine

## 2022-06-22 NOTE — Telephone Encounter (Signed)
ATC patient. LMTCB for an appt with Dr. [name].  Will mail patient a letter asking her to call the office for an appointment and close encounter after 2 attempts to reach per triage protocol.

## 2022-07-02 ENCOUNTER — Ambulatory Visit: Payer: 59

## 2022-07-07 ENCOUNTER — Ambulatory Visit: Payer: 59 | Admitting: Internal Medicine

## 2022-07-08 ENCOUNTER — Ambulatory Visit: Payer: 59

## 2022-07-21 ENCOUNTER — Ambulatory Visit: Payer: 59 | Attending: Interventional Cardiology

## 2022-07-21 DIAGNOSIS — I5021 Acute systolic (congestive) heart failure: Secondary | ICD-10-CM

## 2022-07-21 DIAGNOSIS — I428 Other cardiomyopathies: Secondary | ICD-10-CM

## 2022-07-21 LAB — CUP PACEART INCLINIC DEVICE CHECK
Battery Remaining Longevity: 64 mo
Brady Statistic RA Percent Paced: 11 %
Brady Statistic RV Percent Paced: 99 %
Date Time Interrogation Session: 20231122142226
HighPow Impedance: 49.5 Ohm
Implantable Lead Connection Status: 753985
Implantable Lead Connection Status: 753985
Implantable Lead Connection Status: 753985
Implantable Lead Implant Date: 20231023
Implantable Lead Implant Date: 20231023
Implantable Lead Implant Date: 20231023
Implantable Lead Location: 753858
Implantable Lead Location: 753859
Implantable Lead Location: 753860
Implantable Lead Model: 7122
Implantable Pulse Generator Implant Date: 20231023
Lead Channel Impedance Value: 412.5 Ohm
Lead Channel Impedance Value: 475 Ohm
Lead Channel Impedance Value: 762.5 Ohm
Lead Channel Pacing Threshold Amplitude: 0.5 V
Lead Channel Pacing Threshold Amplitude: 0.5 V
Lead Channel Pacing Threshold Amplitude: 0.75 V
Lead Channel Pacing Threshold Amplitude: 0.75 V
Lead Channel Pacing Threshold Amplitude: 2.5 V
Lead Channel Pacing Threshold Amplitude: 2.5 V
Lead Channel Pacing Threshold Pulse Width: 0.5 ms
Lead Channel Pacing Threshold Pulse Width: 0.5 ms
Lead Channel Pacing Threshold Pulse Width: 0.5 ms
Lead Channel Pacing Threshold Pulse Width: 0.5 ms
Lead Channel Pacing Threshold Pulse Width: 0.5 ms
Lead Channel Pacing Threshold Pulse Width: 0.5 ms
Lead Channel Sensing Intrinsic Amplitude: 1.9 mV
Lead Channel Sensing Intrinsic Amplitude: 12 mV
Lead Channel Setting Pacing Amplitude: 2 V
Lead Channel Setting Pacing Amplitude: 3.25 V
Lead Channel Setting Pacing Amplitude: 3.5 V
Lead Channel Setting Pacing Pulse Width: 0.5 ms
Lead Channel Setting Pacing Pulse Width: 0.5 ms
Lead Channel Setting Sensing Sensitivity: 0.5 mV
Pulse Gen Serial Number: 8949490
Zone Setting Status: 755011

## 2022-07-21 NOTE — Progress Notes (Signed)
Wound check appointment. Dermabond removed. Wound without redness or edema. Stitch removed from left lateral corner of incision site Normal device function. Thresholds, sensing, and impedances consistent with implant measurements. Device programmed at 3.5V for extra safety margin until 3 month visit. Histogram distribution appropriate for patient and level of activity. No mode switches or ventricular arrhythmias noted. Patient educated about wound care, arm mobility, lifting restrictions, shock plan. ROV in 3 months with implanting physician.

## 2022-07-21 NOTE — Patient Instructions (Addendum)
   After Your ICD (Implantable Cardiac Defibrillator)    Monitor your defibrillator site for redness, swelling, and drainage. Call the device clinic at [phone] if you experience these symptoms or fever/chills.  Your incision was closed with Dermabond:  You may shower 1 day after your defibrillator implant and wash your incision with soap and water. Avoid lotions, ointments, or perfumes over your incision until it is well-healed.  You may use a hot tub or a pool after your wound check appointment if the incision is completely closed.  Do not lift, push or pull greater than 10 pounds with the affected arm until August 03 2022.  There are no other restrictions in arm movement after your wound check appointment.    Your ICD is designed to protect you from life threatening heart rhythms. Because of this, you may receive a shock.   1 shock with no symptoms:  Call the office during business hours. 1 shock with symptoms (chest pain, chest pressure, dizziness, lightheadedness, shortness of breath, overall feeling unwell):  Call 911. If you experience 2 or more shocks in 24 hours:  Call 911. If you receive a shock, you should not drive.  [place] DMV - no driving for 6 months if you receive appropriate therapy from your ICD.   ICD Alerts:  Some alerts are vibratory and others beep. These are NOT emergencies. Please call our office to let us know. If this occurs at night or on weekends, it can wait until the next business day. Send a remote transmission.  If your device is capable of reading fluid status (for heart failure), you will be offered monthly monitoring to review this with you.   Remote monitoring is used to monitor your ICD from home. This monitoring is scheduled every 91 days by our office. It allows [place] to keep an eye on the functioning of your device to ensure it is working properly. You will routinely see your Electrophysiologist annually (more often if necessary).   May alternate  Tylenol and if can tolerate Ibuprofen for pain  Also may lay ice pack over site with cloth in between skin and ice pack

## 2022-08-02 NOTE — Progress Notes (Signed)
ADVANCED HF CLINIC NOTE   Primary Care: Vidal Schwalbe, MD HF Cardiologist: Dr. Aundra Dubin  HPI: Ms Koplin is a 66 y.o. with a history of hypothyroidism, former tobacco abuse (quit 20 years ago), COPD, and systolic heart failure with severely reduced EF.    Father with "MI," nephew died with heart failure at 98 yrs old.    Echo 2/23 EF < 20% RV normal.  Sent to ED from cardiology office after echo completed.  Had CP and SOB in waiting room/triage and taken for urgent R/LHC. Cardiac cath with no significant CAD, normal PA and PCWP, LVEDP 28 mmHg, CO preserved.  cMRI with LVEF 12%, no myocardial LGE, elevated T1 indices, ECV 42% in basal septum. Developed acute left sided facial numbness during cMRI. Code stroke called. MRI brain with punctate acute infarct in high right frontal cortex. No AF detected during admit. 30-day monitor showed no AF either. PYP equivocal for transthyretin amyloidosis (grade 1, H/CL 1.47). She was discharged home, weight 122 lbs.  Echo 5/23, EF remained < 20%, moderate LV dilation, normal RV, IVC normal.  Referred to EP for CRT-D.  Follow up 6/23 with Dr. Caryl Comes to discuss CRT-D. Concern for on-going weight loss and CT chest A/P ordered to rule out malignancy before proceeding with CRT. This showed rectal wall thickening, concerning for malignancy, however no other findings to explain weight loss.  She saw GI, noted to have an unremarkable c-scope about a year ago.  Had sigmoidoscopy 09/23 with no evidence of malignancy.  S/p St. Jude CRT-D 10/23.  Today she returns for HF follow up. Overall feeling fine. Breathing has improved since CRT-D. She is no longer SOB walking on flat ground. Has pedal edema occasionally that resolves with elevating legs. Occasional dizziness but no falls. Denies palpitations, CP, or PND/Orthopnea. Appetite ok. No fever or chills. Weight at home 113 pounds. Taking all medications.   St. Jude device interrogation (personally reviewed): CorVue  stable, 99% BiV pacing, no AF/VT  Labs (2/23): K 3.9, creatinine 0.63, hgb 12.3 Labs (4/23): K 4.2, creatinine 0.75 Labs (6/23): K 4.0, creatinine 0.76, LDL 48, HDL 47 Labs (9/23): K 4.2, creatinine 0.62, BNP 366 Labs (10/23): K 4.5, creatinine 0.93  PMH: 1. Hypothyroidism 2. 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 Fibromyalgia 4. Raynaud's phenomenon 5. Chronic systolic CHF: Nonischemic cardiomyopathy. Negative Invitae gene testing.  - Echo (2/23): EF < 20%, RV normal.  - LHC/RHC (2/23): mean RA 2, PA 20/12 mean 13, mean PCWP 7, CI 2.2 Fick/2.79 thermo; no significant CAD.  - Cardiac MRI (2/23): LV EF 12%. No myocardial LGE. Elevated T1 indices, ECV was 42% in the basal septum.  - PYP (2/23): Grade 1, H/CL 1.47 (equivocal); TTR gene testing negative.  - Echo (5/23): EF < 20%, moderate LV dilation, normal RV, IVC normal.  - s/p St. Jude CRT-D (10/23). 6. CVA: 2/23.  - Zio monitor x 1 month showed no AF.  7. HCV: Treated.   Current Outpatient Medications  Medication Sig Dispense Refill   atorvastatin (LIPITOR) 40 MG tablet Take 1 tablet (40 mg total) by mouth daily. 90 tablet 3   carboxymethylcellulose (REFRESH TEARS) 0.5 % SOLN Place 1 drop into both eyes 3 (three) times daily as needed (dry eyes).     clonazePAM (KLONOPIN) 0.5 MG tablet Take 0.5 mg by mouth 2 (two) times daily.     clopidogrel (PLAVIX) 75 MG tablet Take 1 tablet (75  mg total) by mouth daily. 30 tablet 6   dapagliflozin propanediol (FARXIGA) 10 MG TABS tablet Take 1 tablet (10 mg total) by mouth daily. 90 tablet 3   furosemide (LASIX) 20 MG tablet Take 1 tablet (20 mg total) by mouth daily. 90 tablet 3   gabapentin (NEURONTIN) 300 MG capsule Take 300 mg by mouth at bedtime.     levothyroxine (SYNTHROID) 150 MCG tablet TAKE (1) TABLET BY MOUTH DAILY BEFORE BREAKFAST. 30 tablet 11   losartan (COZAAR) 25 MG tablet Take 0.5 tablets (12.5 mg total) by mouth at  bedtime. 15 tablet 5   metoprolol succinate (TOPROL-XL) 25 MG 24 hr tablet Take 1 tablet (25 mg total) by mouth at bedtime. Take with or immediately following a meal. 90 tablet 3   mirtazapine (REMERON) 7.5 MG tablet Take 7.5 mg by mouth at bedtime.     spironolactone (ALDACTONE) 25 MG tablet Take 1 tablet (25 mg total) by mouth daily. 90 tablet 3   No current facility-administered medications for this encounter.   Allergies  Allergen Reactions   Penicillins Shortness Of Breath    Trouble breathing    Sulfa Antibiotics Other (See Comments)    Causes Thrush    Social History   Socioeconomic History   Marital status: Unknown    Spouse name: Not on file   Number of children: 2   Years of education: Not on file   Highest education level: Not on file  Occupational History   Occupation: disabled  Tobacco Use   Smoking status: Former    Types: Cigarettes    Passive exposure: Current   Smokeless tobacco: Never  Vaping Use   Vaping Use: Never used  Substance and Sexual Activity   Alcohol use: Not Currently   Drug use: Never   Sexual activity: Not on file  Other Topics Concern   Not on file  Social History Narrative   Not on file   Social Determinants of Health   Financial Resource Strain: Not on file  Food Insecurity: Not on file  Transportation Needs: Not on file  Physical Activity: Not on file  Stress: Not on file  Social Connections: Not on file  Intimate Partner Violence: Not on file   Family History  Problem Relation Age of Onset   Melanoma Mother    Heart attack Father    Heart disease Father    Melanoma Sister    Melanoma Brother    Heart failure Brother    Colon cancer Maternal Grandmother    Healthy Son    Stomach cancer Maternal Uncle    Esophageal cancer Neg Hx    Rectal cancer Neg Hx    BP 118/72   Pulse 62   Wt 51.6 kg (113 lb 12.8 oz)   LMP  (LMP Unknown)   SpO2 97%   BMI 20.16 kg/m   Wt Readings from Last 3 Encounters:  08/06/22 51.6 kg  (113 lb 12.8 oz)  06/21/22 51.3 kg (113 lb)  06/14/22 51.6 kg (113 lb 12.8 oz)   Physical Exam: General:  NAD. No resp difficulty, walked into clinic, frail HEENT: Normal Neck: Supple. No JVD. Carotids 2+ bilat; no bruits. No lymphadenopathy or thryomegaly appreciated. Cor: PMI nondisplaced. Regular rate & rhythm. No rubs, gallops or murmurs. Lungs: Clear Abdomen: Soft, nontender, nondistended. No hepatosplenomegaly. No bruits or masses. Good bowel sounds. Extremities: No cyanosis, clubbing, rash, edema Neuro: Alert & oriented x 3, cranial nerves grossly intact. Moves all 4 extremities w/o difficulty.  Affect pleasant.  ASSESSMENT & PLAN: 1. Chronic systolic CHF/NICM/LBBB: Etiology not certain. No CAD on LHC. 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 She has a strong family history of CHF (father died at 1 with "MI", brother with "CHF," nephew with "CHF" died at 74). Possible familial cardiomyopathy but Invitae gene testing was negative.  Echo 2/23 showed EF < 20% with diffuse hypokinesis and normal RV.  No LV thrombus noted. R/LHC in 2/23 showed no significant coronary disease, normal RA pressure and PCWP, LVEDP 28 mmHg, preserved CO. cMRI (2/23) showed no myocardial LGE, ECV 42% in the basal septum. PYP not suggestive of TTR amyloidosis (at most equivocal) and genetic testing for TTR mutation was negative. Echo 5/23 EF remains < 20% with relatively normal RV.  She is not volume overloaded on exam or by CorVue. Improved NYHA II-early III symptoms. GDMT limited by low BP.  - Continue Toprol XL 25 mg qhs. - Continue spiro 25 mg daily. BMET today. - Continue losartan 12.5 mg qhs, suspect BP will not tolerate transition to Entresto.  - Continue Farxiga 10 mg daily. No GU symptoms. - Continue Lasix 20 mg daily.  - now s/p CRT-D - ? how much of her functional limitation is due to heart failure vs pulmonary disease, suspect combination of both. Arrange for CPX. 2. CVA: Code Stroke during cMRI.  Head CT negative, MRI brain + punctate acute infarct in the high right frontal cortex. CTA Head and Neck no LVO. Suspect infarct due to probable cardio embolic and small vessel disease. No Afib detected during admit. 30 day monitor in 3/23 showed no Afib. - Continue Plavix, no ASA.  - Continue atorvastatin, good lipids 6/23. 3. 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  However, PFTs in 4/23 showed only minimal obstruction.  - CT chest (7/23) showed emphysema as well as several cysts and nodules raising suspicion for Langerhans cell histiocytosis. She has been referred back to Dr. [name] and has follow up this month. 4. Weight loss: unintentional. CT chest, A/P showed rectal wall thickening, concerning for malignancy. Patient tells me she had had full work up by GI w/ colonoscopy last year and had 2 polyps removed.  No malignancy on sigmoidoscopy 9/23.   Keep follow up in 2 months with Dr. Aundra Dubin, as scheduled.   Maricela Bo [facility] FNP-BC 08/06/22

## 2022-08-06 ENCOUNTER — Encounter (HOSPITAL_COMMUNITY): Payer: Self-pay

## 2022-08-06 ENCOUNTER — Ambulatory Visit (HOSPITAL_COMMUNITY)
Admission: RE | Admit: 2022-08-06 | Discharge: 2022-08-06 | Disposition: A | Discharge status: Home / Self Care | Payer: 59 | Source: Ambulatory Visit | Attending: Family Medicine | Admitting: Family Medicine

## 2022-08-06 VITALS — BP 118/72 | HR 62 | Wt 113.8 lb

## 2022-08-06 DIAGNOSIS — R634 Abnormal weight loss: Secondary | ICD-10-CM | POA: Diagnosis not present

## 2022-08-06 DIAGNOSIS — E039 Hypothyroidism, unspecified: Secondary | ICD-10-CM | POA: Insufficient documentation

## 2022-08-06 DIAGNOSIS — Z87891 Personal history of nicotine dependence: Secondary | ICD-10-CM | POA: Insufficient documentation

## 2022-08-06 DIAGNOSIS — Z7902 Long term (current) use of antithrombotics/antiplatelets: Secondary | ICD-10-CM | POA: Insufficient documentation

## 2022-08-06 DIAGNOSIS — I69392 Facial weakness following cerebral infarction: Secondary | ICD-10-CM | POA: Diagnosis not present

## 2022-08-06 DIAGNOSIS — I5022 Chronic systolic (congestive) heart failure: Secondary | ICD-10-CM | POA: Insufficient documentation

## 2022-08-06 DIAGNOSIS — Z79899 Other long term (current) drug therapy: Secondary | ICD-10-CM | POA: Insufficient documentation

## 2022-08-06 DIAGNOSIS — Z8249 Family history of ischemic heart disease and other diseases of the circulatory system: Secondary | ICD-10-CM | POA: Insufficient documentation

## 2022-08-06 DIAGNOSIS — Z6820 Body mass index (BMI) 20.0-20.9, adult: Secondary | ICD-10-CM | POA: Diagnosis not present

## 2022-08-06 DIAGNOSIS — J449 Chronic obstructive pulmonary disease, unspecified: Secondary | ICD-10-CM | POA: Diagnosis not present

## 2022-08-06 DIAGNOSIS — Z95810 Presence of automatic (implantable) cardiac defibrillator: Secondary | ICD-10-CM | POA: Diagnosis not present

## 2022-08-06 DIAGNOSIS — I447 Left bundle-branch block, unspecified: Secondary | ICD-10-CM | POA: Insufficient documentation

## 2022-08-06 DIAGNOSIS — J439 Emphysema, unspecified: Secondary | ICD-10-CM | POA: Diagnosis not present

## 2022-08-06 DIAGNOSIS — Z8673 Personal history of transient ischemic attack (TIA), and cerebral infarction without residual deficits: Secondary | ICD-10-CM | POA: Diagnosis not present

## 2022-08-06 DIAGNOSIS — I428 Other cardiomyopathies: Secondary | ICD-10-CM | POA: Diagnosis not present

## 2022-08-06 LAB — BASIC METABOLIC PANEL
Anion gap: 9 (ref 5–15)
BUN: 12 mg/dL (ref 8–23)
CO2: 26 mmol/L (ref 22–32)
Calcium: 9 mg/dL (ref 8.9–10.3)
Chloride: 105 mmol/L (ref 98–111)
Creatinine, Ser: 0.71 mg/dL (ref 0.44–1.00)
GFR, Estimated: >60 mL/min (ref >60)
Glucose, Bld: 97 mg/dL (ref 70–99)
Potassium: 4.3 mmol/L (ref 3.5–5.1)
Sodium: 140 mmol/L (ref 135–145)

## 2022-08-06 NOTE — Patient Instructions (Signed)
It was great to see you today! No medication changes are needed at this time.   Labs today We will only contact you if something comes back abnormal or we need to make some changes. Otherwise no news is good news!  Your physician has recommended that you have a cardiopulmonary stress test (CPX). CPX testing is a non-invasive measurement of heart and lung function. It replaces a traditional treadmill stress test. This type of test provides a tremendous amount of information that relates not only to your present condition but also for future outcomes. This test combines measurements of you ventilation, respiratory gas exchange in the lungs, electrocardiogram (EKG), blood pressure and physical response before, during, and following an exercise protocol.  Your physician recommends that you schedule a follow-up appointment in: 2 months with Dr Aundra Dubin   Do the following things EVERYDAY: Weigh yourself in the morning before breakfast. Write it down and keep it in a log. Take your medicines as prescribed Eat low salt foods--Limit salt (sodium) to 2000 mg per day.  Stay as active as you can everyday Limit all fluids for the day to less than 2 liters   At the [facility], you and your health needs are our priority. As part of our continuing mission to provide you with exceptional heart care, we have created designated Provider Care Teams. These Care Teams include your primary Cardiologist (physician) and Advanced Practice Providers (APPs- Physician Assistants and Nurse Practitioners) who all work together to provide you with the care you need, when you need it.   You may see any of the following providers on your designated Care Team at your next follow up: Dr Glori Bickers Dr Loralie Champagne Dr. Roxana Hires, NP Lyda Jester, [place] [facility] [place], [place] Forestine Na, NP Audry Riles, PharmD   Please be sure to bring in all your medications  bottles to every appointment.   If you have any questions or concerns before your next appointment please send [place] a message through [place] or call our office at [phone].    TO LEAVE A MESSAGE FOR THE NURSE SELECT OPTION 2, PLEASE LEAVE A MESSAGE INCLUDING: YOUR NAME DATE OF BIRTH CALL BACK NUMBER REASON FOR CALL**this is important as we prioritize the call backs  YOU WILL RECEIVE A CALL BACK THE SAME DAY AS LONG AS YOU CALL BEFORE 4:00 PM

## 2022-08-25 ENCOUNTER — Ambulatory Visit: Payer: 59 | Admitting: Internal Medicine

## 2022-08-25 NOTE — Progress Notes (Deleted)
Stacey Hartman, female    DOB: November 06, 1955,    MRN: [id number]   Brief patient profile:  36   yowf quit smoking  around 2002 with cough/sob completely improved to point where no symptoms including walking up to several miles including hills but dx fibromyalgia since 2012  then August 2021 salmonella/rx as out pt with "pcn" then severe skin peeling lost 22 lb and persistent diarrhea/sob/ cough referred to pulmonary clinic in [facility]  08/25/2021 by [name] already under care of skin doctor in [place], GI doctor in [place], rheumatology eval pending.      History of Present Illness  08/25/2021  Pulmonary/ 1st office eval/ Stacey Hartman / [place] Office  Chief Complaint  Patient presents with   Consult    Hx of COPD. After penicillin has noticed struggle with breathing since then (03/2020).   Coughs up green mucus    Dyspnea:  getting worse to point of 100 ft doe Cough: congested cough worse when lies down  Sleep: bed is flat with 3 pillows  SABA use: spiriva and albuterol helped / has not tried pred for this illness but took it previously for "bronchitis" and seemed to help Omeprazole bid per GI  Rec Stiolto one puff a day x one week and if not better start on 2 puffs each am  Omeprazole 20 mg Take 30- 60 min before your first and last meals of the day  GERD diet reviewed, bed blocks rec  Please remember to go to the lab department    > BNP 442 > echo next :   EF < 20% admitted If you do have ulcerative colitis it may be affecting your airways - discuss with your GI doctor       01/06/2022  f/u ov/[place] office/Stacey Hartman re: doe maint on stiolto  / followed now in chf clinic Chief Complaint  Patient presents with   Follow-up    Coughing and breathing has improved.  Has had cardiac issues since last visit.   Dyspnea:  still only able to walk about 100 ft flat surfaces slow pace = MMRC3 = can't walk 100 yards even at a slow pace at a flat grade s stopping due to sob   Cough: something   p supper better at hs  Sleeping: about 30 degrees hosp bed  SABA use: has neb not using  02: none  Covid status: vax x 2  Rec We will check your 0xygen level today walking Only use your albuterol-ipatropium nebulizer as a rescue medication  Ok to leave off stiolto     08/25/2022  f/u ov/ office/Stacey Hartman re: *** maint on ***  No chief complaint on file.   Dyspnea:  *** Cough: *** Sleeping: *** SABA use: *** 02: *** Covid status: *** Lung cancer screening: ***   No obvious day to day or daytime variability or assoc excess/ purulent sputum or mucus plugs or hemoptysis or cp or chest tightness, subjective wheeze or overt sinus or hb symptoms.   *** without nocturnal  or early am exacerbation  of respiratory  c/o's or need for noct saba. Also denies any obvious fluctuation of symptoms with weather or environmental changes or other aggravating or alleviating factors except as outlined above   No unusual exposure hx or h/o childhood pna/ asthma or knowledge of premature birth.  Current Allergies, Complete Past Medical History, Past Surgical History, Family History, and Social History were reviewed in [employer] record.  ROS  The following are not  active complaints unless bolded Hoarseness, sore throat, dysphagia, dental problems, itching, sneezing,  nasal congestion or discharge of excess mucus or purulent secretions, ear ache,   fever, chills, sweats, unintended wt loss or wt gain, classically pleuritic or exertional cp,  orthopnea pnd or arm/hand swelling  or leg swelling, presyncope, palpitations, abdominal pain, anorexia, nausea, vomiting, diarrhea  or change in bowel habits or change in bladder habits, change in stools or change in urine, dysuria, hematuria,  rash, arthralgias, visual complaints, headache, numbness, weakness or ataxia or problems with walking or coordination,  change in mood or  memory.        No outpatient medications have been marked  as taking for the 08/25/22 encounter (Appointment) with [name], MD.                  Objective:     08/25/2022     ***   01/06/22 118 lb 6.4 oz (53.7 kg)  12/18/21 119 lb (54 kg)  11/24/21 123 lb 12.8 oz (56.2 kg)    Vital signs reviewed  08/25/2022  - Note at rest 02 sats  ***% on ***   General appearance:    ***    min insp crackles in bases bilaterall S3 gallop / 2/6                       Assessment

## 2022-09-20 ENCOUNTER — Ambulatory Visit: Payer: 59 | Attending: Internal Medicine

## 2022-09-20 DIAGNOSIS — Z95810 Presence of automatic (implantable) cardiac defibrillator: Secondary | ICD-10-CM | POA: Insufficient documentation

## 2022-09-20 DIAGNOSIS — I447 Left bundle-branch block, unspecified: Secondary | ICD-10-CM | POA: Diagnosis not present

## 2022-09-21 ENCOUNTER — Encounter: Payer: Self-pay | Admitting: Internal Medicine

## 2022-09-21 ENCOUNTER — Ambulatory Visit: Payer: 59 | Attending: Internal Medicine | Admitting: Internal Medicine

## 2022-09-21 VITALS — Ht 63.0 in

## 2022-09-21 DIAGNOSIS — R600 Localized edema: Secondary | ICD-10-CM

## 2022-09-21 DIAGNOSIS — Z95810 Presence of automatic (implantable) cardiac defibrillator: Secondary | ICD-10-CM | POA: Diagnosis not present

## 2022-09-21 DIAGNOSIS — I5022 Chronic systolic (congestive) heart failure: Secondary | ICD-10-CM | POA: Diagnosis not present

## 2022-09-21 DIAGNOSIS — I428 Other cardiomyopathies: Secondary | ICD-10-CM | POA: Diagnosis not present

## 2022-09-21 DIAGNOSIS — I447 Left bundle-branch block, unspecified: Secondary | ICD-10-CM

## 2022-09-21 DIAGNOSIS — R0989 Other specified symptoms and signs involving the circulatory and respiratory systems: Secondary | ICD-10-CM

## 2022-09-21 NOTE — Progress Notes (Signed)
Electrophysiology TeleHealth Note   Due to national recommendations of social distancing due to COVID 19, an audio/video telehealth visit is felt to be most appropriate for this patient at this time.  See MyChart message from today for the patient's consent to telehealth for [facility].   Date:  09/21/2022   ID:  Stacey Hartman, DOB Jun 28, 1956, MRN [id number]  Location: patient's home  Provider location: [address], [place]  Evaluation Performed: Follow-up visit  PCP:  Vidal Schwalbe, MD  Cardiologist:     Electrophysiologist:  SK   Chief Complaint:  CRT-D  History of Present Illness:    Stacey Hartman is a 67 y.o. female who presents via audio/video conferencing for a telehealth visit today.  Since last being seen in our clinic, for consideration of  ICD initially deferred 2/2 unexplained weightloss and rectal mass, determined not to be malignant and CRT [employer]  implanted 10/23  for primary prevention. Class 3b CHF   Also Hx of CVA   . Seen now post implant , CHF notes improved function post CRT    DATE TEST EF    2/23 Echo   <20 %    2/23 LHC   % No obstructive CAD  2/23 cMRI  12% No LGE  2/23 PYP   Non diagnostic  5/23 Echo  <20%      Date Cr K Hgb  6/23 0.76 4.0 12.3            the patient reports less dyspnea. Orthopnea-- some swelling of L leg with erythema and pain about 1 month ago--now improved btut not fully resolved some pain but no longer swollen or red         Past Medical History:  Diagnosis Date   Anxiety    Chronic systolic CHF (congestive heart failure), NYHA class 3 (HCC)    Fibromyalgia    Hypothyroid    LBBB (left bundle branch block)    NICM (nonischemic cardiomyopathy) (HCC)    Thyroid disease    Weight loss, non-intentional     Past Surgical History:  Procedure Laterality Date   ANKLE SURGERY Right    BIV ICD INSERTION CRT-D N/A 06/21/2022   Procedure: BIV ICD INSERTION CRT-D;  Surgeon: Deboraha Sprang, MD;  Location: [place] [facility];  Service: Cardiovascular;  Laterality: N/A;   CHOLECYSTECTOMY     FLEXIBLE SIGMOIDOSCOPY N/A 05/24/2022   Procedure: FLEXIBLE SIGMOIDOSCOPY;  Surgeon: Doran Stabler, MD;  Location: [place];  Service: Gastroenterology;  Laterality: N/A;  NO SEDATION   RIGHT/LEFT HEART CATH AND CORONARY ANGIOGRAPHY N/A 10/05/2021   Procedure: RIGHT/LEFT HEART CATH AND CORONARY ANGIOGRAPHY;  Surgeon: Larey Dresser, MD;  Location: [place] [facility];  Service: Cardiovascular;  Laterality: N/A;   TUBAL LIGATION      Current Outpatient Medications  Medication Sig Dispense Refill   atorvastatin (LIPITOR) 40 MG tablet Take 1 tablet (40 mg total) by mouth daily. 90 tablet 3   carboxymethylcellulose (REFRESH TEARS) 0.5 % SOLN Place 1 drop into both eyes 3 (three) times daily as needed (dry eyes).     clonazePAM (KLONOPIN) 0.5 MG tablet Take 0.5 mg by mouth 2 (two) times daily.     clopidogrel (PLAVIX) 75 MG tablet Take 1 tablet (75 mg total) by mouth daily. 30 tablet 6   dapagliflozin propanediol (FARXIGA) 10 MG TABS tablet Take 1 tablet (10 mg total) by mouth daily. 90 tablet 3  furosemide (LASIX) 20 MG tablet Take 1 tablet (20 mg total) by mouth daily. 90 tablet 3   gabapentin (NEURONTIN) 300 MG capsule Take 300 mg by mouth at bedtime.     levothyroxine (SYNTHROID) 150 MCG tablet TAKE (1) TABLET BY MOUTH DAILY BEFORE BREAKFAST. 30 tablet 11   losartan (COZAAR) 25 MG tablet Take 0.5 tablets (12.5 mg total) by mouth at bedtime. 15 tablet 5   metoprolol succinate (TOPROL-XL) 25 MG 24 hr tablet Take 1 tablet (25 mg total) by mouth at bedtime. Take with or immediately following a meal. 90 tablet 3   mirtazapine (REMERON) 7.5 MG tablet Take 7.5 mg by mouth at bedtime.     spironolactone (ALDACTONE) 25 MG tablet Take 1 tablet (25 mg total) by mouth daily. 90 tablet 3   No current facility-administered medications for this visit.    Allergies:   Penicillins and  Sulfa antibiotics      ROS:  Please see the history of present illness.   All other systems are personally reviewed and negative.    Exam:    Vital Signs:  Ht '5\' 3"'$  (1.6 m)   LMP  (LMP Unknown)   BMI 20.16 kg/m     Data Reviewed:      Other studies personally reviewed: Additional studies/ records that were reviewed today include:      Last device remote is reviewed from [facility] PDF dated 11/23 which Foye Spurling normal device function,   arrhythmias - none    ASSESSMENT & PLAN:    Nonischemic cardiomyopathy   Left bundle branch block   Congestive heart failure-class IIIb   Weight loss-progressive with another 5% over the last 3 months despite appetite   Improved CHF  post implant Left leg swelling with eythema a few weeks ago raises concern for cellulitis but now largely resolved without ABx.  Could alos    Follow-up:  77mEP APP for device reprogrammed  Next remote: As Scheduled   Current medicines are reviewed at length with the patient today.   The patient does not have concerns regarding her medicines.  The following changes were made today:  none  Labs/ tests ordered today include: can we do venous doppler left leg for DVT  Orders Placed This Encounter  Procedures   VAS [place]OWER EXTREMITY VENOUS (DVT)    Future tests       Today, I have spent 21 minutes with the patient with telehealth technology discussing the above.  Signed, SVirl Axe MD  09/21/2022 2:02 PM     [facility] J[address] Hollow StreetSForest CityGSenaNC [phone]) [phone](fax)

## 2022-09-21 NOTE — Patient Instructions (Signed)
Medication Instructions:  Your physician recommends that you continue on your current medications as directed. Please refer to the Current Medication list given to you today.  *If you need a refill on your cardiac medications before your next appointment, please call your pharmacy*   Lab Work: None ordered.  If you have labs (blood work) drawn today and your tests are completely normal, you will receive your results only by: [place] (if you have MyChart) OR A paper copy in the mail If you have any lab test that is abnormal or we need to change your treatment, we will call you to review the results.   Testing/Procedures: Your physician has requested that you have a lower or upper extremity venous duplex. This test is an ultrasound of the veins in the legs or arms. It looks at venous blood flow that carries blood from the heart to the legs or arms. Allow one hour for a Lower Venous exam. Allow thirty minutes for an Upper Venous exam. There are no restrictions or special instructions.     Follow-Up: At [facility], you and your health needs are our priority.  As part of our continuing mission to provide you with exceptional heart care, we have created designated Provider Care Teams.  These Care Teams include your primary Cardiologist (physician) and Advanced Practice Providers (APPs -  Physician Assistants and Nurse Practitioners) who all work together to provide you with the care you need, when you need it.  We recommend signing up for the patient portal called "MyChart".  Sign up information is provided on this After Visit Summary.  MyChart is used to connect with patients for Virtual Visits (Telemedicine).  Patients are able to view lab/test results, encounter notes, upcoming appointments, etc.  Non-urgent messages can be sent to your provider as well.   To learn more about what you can do with MyChart, go to [name].    Your next appointment:   10/22/2022  with Lily Kocher

## 2022-09-22 LAB — CUP PACEART REMOTE DEVICE CHECK
Battery Remaining Longevity: 62 mo
Battery Remaining Percentage: 92 %
Battery Voltage: 3.16 V
Brady Statistic AP VP Percent: 11 %
Brady Statistic AP VS Percent: 1 %
Brady Statistic AS VP Percent: 88 %
Brady Statistic AS VS Percent: 1 %
Brady Statistic RA Percent Paced: 11 %
Date Time Interrogation Session: 20240122020025
HighPow Impedance: 61 Ohm
HighPow Impedance: 61 Ohm
Implantable Lead Connection Status: 753985
Implantable Lead Connection Status: 753985
Implantable Lead Connection Status: 753985
Implantable Lead Implant Date: 20231023
Implantable Lead Implant Date: 20231023
Implantable Lead Implant Date: 20231023
Implantable Lead Location: 753858
Implantable Lead Location: 753859
Implantable Lead Location: 753860
Implantable Lead Model: 7122
Implantable Pulse Generator Implant Date: 20231023
Lead Channel Impedance Value: 430 Ohm
Lead Channel Impedance Value: 430 Ohm
Lead Channel Impedance Value: 890 Ohm
Lead Channel Pacing Threshold Amplitude: 0.5 V
Lead Channel Pacing Threshold Amplitude: 0.5 V
Lead Channel Pacing Threshold Amplitude: 2.875 V
Lead Channel Pacing Threshold Pulse Width: 0.5 ms
Lead Channel Pacing Threshold Pulse Width: 0.5 ms
Lead Channel Pacing Threshold Pulse Width: 0.5 ms
Lead Channel Sensing Intrinsic Amplitude: 11.3 mV
Lead Channel Sensing Intrinsic Amplitude: 2.4 mV
Lead Channel Setting Pacing Amplitude: 2 V
Lead Channel Setting Pacing Amplitude: 3.5 V
Lead Channel Setting Pacing Amplitude: 3.875
Lead Channel Setting Pacing Pulse Width: 0.5 ms
Lead Channel Setting Pacing Pulse Width: 0.5 ms
Lead Channel Setting Sensing Sensitivity: 0.5 mV
Pulse Gen Serial Number: 8949490
Zone Setting Status: 755011

## 2022-09-28 ENCOUNTER — Ambulatory Visit (HOSPITAL_COMMUNITY): Payer: 59

## 2022-10-08 ENCOUNTER — Ambulatory Visit (HOSPITAL_COMMUNITY)
Admission: RE | Admit: 2022-10-08 | Discharge: 2022-10-08 | Disposition: A | Discharge status: Home / Self Care | Payer: 59 | Source: Ambulatory Visit | Attending: Internal Medicine | Admitting: Internal Medicine

## 2022-10-08 ENCOUNTER — Encounter: Payer: Self-pay | Admitting: Cardiology

## 2022-10-08 DIAGNOSIS — R0989 Other specified symptoms and signs involving the circulatory and respiratory systems: Secondary | ICD-10-CM | POA: Diagnosis present

## 2022-10-08 DIAGNOSIS — R600 Localized edema: Secondary | ICD-10-CM | POA: Diagnosis present

## 2022-10-12 ENCOUNTER — Encounter (HOSPITAL_COMMUNITY): Payer: 59 | Admitting: Cardiology

## 2022-10-22 ENCOUNTER — Encounter: Payer: 59 | Admitting: Student

## 2022-10-22 NOTE — Progress Notes (Incomplete)
Cardiology Office Note Date:  10/22/2022  Patient ID:  Stacey Hartman, Stacey Hartman 1956-04-16, MRN [id number] PCP:  Vidal Schwalbe, MD  Cardiologist:  Berniece Salines, DO HF Cardiologist: Loralie Champagne, MD Electrophysiologist: Virl Axe, MD  ***refresh   Chief Complaint: ***  History of Present Illness: Stacey Hartman is a 67 y.o. female with PMH notable for NICM, LBBB, HFrEF s/p CRT-D , former tobacco abuse, COPD, weight loss; seen today for Virl Axe, MD for routine electrophysiology followup.  Televisit with Dr. Caryl Comes 08/2022, having L leg swelling that improved with abx but still present, concern for DVT. U/S negative for DVT. Follows regularly with HF team, last seen 07/2022. Strong family history of HF.  *** fluid status *** exercise tolerance *** due for HF appt  *** note v-pacing, want high   Since last being seen in our clinic the patient reports doing ***.  she denies chest pain, palpitations, dyspnea, PND, orthopnea, nausea, vomiting, dizziness, syncope, edema, weight gain, or early satiety.     Device Information: [employer] Jude CRT-D, imp 05/2022; dx HFrEF, LBBB  Past Medical History:  Diagnosis Date   Anxiety    Chronic systolic CHF (congestive heart failure), NYHA class 3 (HCC)    Fibromyalgia    Hypothyroid    LBBB (left bundle branch block)    NICM (nonischemic cardiomyopathy) (HCC)    Thyroid disease    Weight loss, non-intentional     Past Surgical History:  Procedure Laterality Date   ANKLE SURGERY Right    BIV ICD INSERTION CRT-D N/A 06/21/2022   Procedure: BIV ICD INSERTION CRT-D;  Surgeon: Deboraha Sprang, MD;  Location: [facility];  Service: Cardiovascular;  Laterality: N/A;   CHOLECYSTECTOMY     FLEXIBLE SIGMOIDOSCOPY N/A 05/24/2022   Procedure: FLEXIBLE SIGMOIDOSCOPY;  Surgeon: Doran Stabler, MD;  Location: [place];  Service: Gastroenterology;  Laterality: N/A;  NO SEDATION   RIGHT/LEFT HEART CATH AND CORONARY ANGIOGRAPHY N/A  10/05/2021   Procedure: RIGHT/LEFT HEART CATH AND CORONARY ANGIOGRAPHY;  Surgeon: Larey Dresser, MD;  Location: [facility];  Service: Cardiovascular;  Laterality: N/A;   TUBAL LIGATION      Current Outpatient Medications  Medication Instructions   atorvastatin (LIPITOR) 40 mg, Oral, Daily   carboxymethylcellulose (REFRESH TEARS) 0.5 % SOLN 1 drop, Both Eyes, 3 times daily PRN   clonazePAM (KLONOPIN) 0.5 mg, Oral, 2 times daily   clopidogrel (PLAVIX) 75 mg, Oral, Daily   dapagliflozin propanediol (FARXIGA) 10 mg, Oral, Daily   furosemide (LASIX) 20 mg, Oral, Daily   gabapentin (NEURONTIN) 300 mg, Oral, Daily at bedtime   levothyroxine (SYNTHROID) 150 MCG tablet TAKE (1) TABLET BY MOUTH DAILY BEFORE BREAKFAST.   losartan (COZAAR) 12.5 mg, Oral, Daily at bedtime   metoprolol succinate (TOPROL-XL) 25 mg, Oral, Daily at bedtime, Take with or immediately following a meal.   mirtazapine (REMERON) 7.5 mg, Oral, Daily at bedtime   spironolactone (ALDACTONE) 25 mg, Oral, Daily      Social History:  The patient  reports that she has quit smoking. Her smoking use included cigarettes. She has been exposed to tobacco smoke. She has never used smokeless tobacco. She reports that she does not currently use alcohol. She reports that she does not use drugs.   Family History:  *** include only if pertinent The patient's family history includes Colon cancer in her maternal grandmother; Healthy in her son; Heart attack in her father; Heart disease in her father; Heart  failure in her brother; Melanoma in her brother, mother, and sister; Stomach cancer in her maternal uncle.*** Nephew died of HF at 1yr. Father died at 458from "MI" Brother has HF  ROS:  Please see the history of present illness. All other systems are reviewed and otherwise negative.   PHYSICAL EXAM: *** VS:  LMP  (LMP Unknown)  BMI: There is no height or weight on file to calculate BMI.  GEN- The patient is well appearing,  alert and oriented x 3 today.   HEENT: normocephalic, atraumatic; sclera clear, conjunctiva pink; hearing intact; oropharynx clear; neck supple, no JVP Lungs- Clear to ausculation bilaterally, normal work of breathing.  No wheezes, rales, rhonchi Heart- {Blank single:19197::"Regular","Irregularly irregular"} rate and rhythm, no murmurs, rubs or gallops, PMI not laterally displaced GI- soft, non-tender, non-distended, bowel sounds present, no hepatosplenomegaly Extremities- {EDEMA LEVEL:28147::"No"} peripheral edema. no clubbing or cyanosis; DP/PT/radial pulses 2+ bilaterally MS- no significant deformity or atrophy Skin- warm and dry, no rash or lesion, *** device pocket well-healed Psych- euthymic mood, full affect Neuro- strength and sensation are intact   Device interrogation done today and reviewed by myself:  Battery *** Lead thresholds, impedence, sensing stable *** *** episodes *** changes made today  EKG {ACTION; IS/IS NGI:087931ordered. Personal review of EKG from {Blank single:19197::"today","***"} shows:  ***  Recent Labs: 06/07/2022: B Natriuretic Peptide 442.0 06/14/2022: Hemoglobin 15.3; Platelets 212 08/06/2022: BUN 12; Creatinine, Ser 0.71; Potassium 4.3; Sodium 140  01/26/2022: Cholesterol 114; HDL 47; LDL Cholesterol 48; Total CHOL/HDL Ratio 2.4; Triglycerides 93; VLDL 19   CrCl cannot be calculated (Patient's most recent lab result is older than the maximum 21 days allowed.).   Wt Readings from Last 3 Encounters:  08/06/22 113 lb 12.8 oz (51.6 kg)  06/21/22 113 lb (51.3 kg)  06/14/22 113 lb 12.8 oz (51.6 kg)     Additional studies reviewed include: Previous EP, cardiology notes.   Left lower extremity venous ultrasound, 10/08/2022 RIGHT:  - No evidence of common femoral vein obstruction.  LEFT:  - No evidence of deep vein thrombosis in the lower extremity. No indirect  evidence of obstruction proximal to the inguinal ligament.  - No cystic structure found  in the popliteal fossa.   TTE, 01/26/22  1. Left ventricular ejection fraction, by estimation, is <20%. The left ventricle has severely decreased function. The left ventricle demonstrates global hypokinesis. The left ventricular internal cavity size was moderately dilated. Left ventricular diastolic parameters are consistent with Grade I diastolic dysfunction (impaired relaxation). The average left ventricular global longitudinal strain is -10.2 %. The global longitudinal strain is abnormal.   2. Right ventricular systolic function is normal. The right ventricular size is normal. Tricuspid regurgitation signal is inadequate for assessing PA pressure.   3. Left atrial size was mildly dilated.   4. A small pericardial effusion is present.   5. The mitral valve is normal in structure. Trivial mitral valve regurgitation. No evidence of mitral stenosis.   6. The aortic valve is tricuspid. Aortic valve regurgitation is not visualized. No aortic stenosis is present.   7. The inferior vena cava is normal in size with greater than 50% respiratory variability, suggesting right atrial pressure of 3 mmHg.   ASSESSMENT AND PLAN:  #) ***   #) ***    Current medicines are reviewed at length with the patient today.   The patient {ACTIONS; HAS/DOES NOT HAVE:19233} concerns regarding her medicines.  The following changes were made today:  {NONE DEFAULTED:18576}  Labs/  tests ordered today include: *** No orders of the defined types were placed in this encounter.    Disposition: Follow up with {EPMDS:28135} in {EPFOLLOW UP:28173}   Signed, Mamie Levers, NP  10/22/22  9:35 AM  Electrophysiology [facility]

## 2022-10-27 ENCOUNTER — Ambulatory Visit: Payer: 59 | Admitting: Internal Medicine

## 2022-10-27 NOTE — Progress Notes (Deleted)
Stacey Hartman, female    DOB: Apr 23, 1956,    MRN: [id number]   Brief patient profile:  73   yowf quit smoking  around 2002 with cough/sob completely improved to point where no symptoms including walking up to several miles including hills but dx fibromyalgia since 2012  then August 2021 salmonella/rx as out pt with "pcn" then severe skin peeling lost 22 lb and persistent diarrhea/sob/ cough referred to pulmonary clinic in [facility]  08/25/2021 by [name] already under care of skin doctor in [place], GI doctor in [place], rheumatology eval pending.      History of Present Illness  08/25/2021  Pulmonary/ 1st office eval/ Yandel Zeiner / [place] [facility]  Chief Complaint  Patient presents with   Consult    Hx of COPD. After penicillin has noticed struggle with breathing since then (03/2020).   Coughs up green mucus    Dyspnea:  getting worse to point of 100 ft doe Cough: congested cough worse when lies down  Sleep: bed is flat with 3 pillows  SABA use: spiriva and albuterol helped / has not tried pred for this illness but took it previously for "bronchitis" and seemed to help Omeprazole bid per GI  Rec Stiolto one puff a day x one week and if not better start on 2 puffs each am  Omeprazole 20 mg Take 30- 60 min before your first and last meals of the day  GERD diet reviewed, bed blocks rec  Please remember to go to the lab department    > BNP 442 > echo next :   EF < 20% admitted If you do have ulcerative colitis it may be affecting your airways - discuss with your GI doctor       01/06/2022  f/u ov/[place] office/Fue Cervenka re: doe maint on stiolto  / followed now in [facility] clinic Chief Complaint  Patient presents with   Follow-up    Coughing and breathing has improved.  Has had cardiac issues since last visit.   Dyspnea:  still only able to walk about 100 ft flat surfaces slow pace = MMRC3 = can't walk 100 yards even at a slow pace at a flat grade s stopping due to sob   Cough: something   p supper better at hs  Sleeping: about 30 degrees hosp bed  SABA use: has neb not using  02: none  Covid status: vax x 2  Rec We will check your 0xygen level today walking Only use your albuterol-ipatropium nebulizer as a rescue medication  Ok to leave off stiolto    10/27/2022  f/u ov/[place] office/Delita Chiquito re: *** maint on ***  No chief complaint on file.   Dyspnea:  *** Cough: *** Sleeping: *** SABA use: *** 02: *** Covid status: *** Lung cancer screening: ***   No obvious day to day or daytime variability or assoc excess/ purulent sputum or mucus plugs or hemoptysis or cp or chest tightness, subjective wheeze or overt sinus or hb symptoms.   *** without nocturnal  or early am exacerbation  of respiratory  c/o's or need for noct saba. Also denies any obvious fluctuation of symptoms with weather or environmental changes or other aggravating or alleviating factors except as outlined above   No unusual exposure hx or h/o childhood pna/ asthma or knowledge of premature birth.  Current Allergies, Complete Past Medical History, Past Surgical History, Family History, and Social History were reviewed in [employer] record.  ROS  The following are not active  complaints unless bolded Hoarseness, sore throat, dysphagia, dental problems, itching, sneezing,  nasal congestion or discharge of excess mucus or purulent secretions, ear ache,   fever, chills, sweats, unintended wt loss or wt gain, classically pleuritic or exertional cp,  orthopnea pnd or arm/hand swelling  or leg swelling, presyncope, palpitations, abdominal pain, anorexia, nausea, vomiting, diarrhea  or change in bowel habits or change in bladder habits, change in stools or change in urine, dysuria, hematuria,  rash, arthralgias, visual complaints, headache, numbness, weakness or ataxia or problems with walking or coordination,  change in mood or  memory.        No outpatient medications have been marked as  taking for the 10/27/22 encounter (Appointment) with [name], MD.                    Objective:     Wts  10/27/2022        ***  01/06/22 118 lb 6.4 oz (53.7 kg)  12/18/21 119 lb (54 kg)  11/24/21 123 lb 12.8 oz (56.2 kg)    Vital signs reviewed  10/27/2022  - Note at rest 02 sats  ***% on ***   General appearance:    ***      S3 gallop / 2/6 ***                      Assessment

## 2022-10-27 NOTE — Progress Notes (Deleted)
Stacey Hartman, female    DOB: [birth year],    MRN: [id number]   Brief patient profile:  86  yowf quit smoking  around 2002 with cough/sob completely improved to point where no symptoms including walking up to several miles including hills but dx fibromyalgia since 2012  then August 2021 salmonella/rx as out pt with "pcn" then severe skin peeling lost 22 lb and persistent diarrhea/sob/ cough referred to pulmonary clinic in [facility]  08/25/2021 by [name] already under care of skin doctor in [place], GI doctor in [place], rheumatology eval pending.      History of Present Illness  08/25/2021  Pulmonary/ 1st office eval/ Jamerson Vonbargen / [place] [facility]  Chief Complaint  Patient presents with   Consult    Hx of COPD. After penicillin has noticed struggle with breathing since then (03/2020).   Coughs up green mucus    Dyspnea:  getting worse to point of 100 ft doe Cough: congested cough worse when lies down  Sleep: bed is flat with 3 pillows  SABA use: spiriva and albuterol helped / has not tried pred for this illness but took it previously for "bronchitis" and seemed to help Omeprazole bid per GI  Rec Stiolto one puff a day x one week and if not better start on 2 puffs each am  Omeprazole 20 mg Take 30- 60 min before your first and last meals of the day  GERD diet reviewed, bed blocks rec  Please remember to go to the lab department    > BNP 442 > echo next :   EF < 20% admitted If you do have ulcerative colitis it may be affecting your airways - discuss with your GI doctor       01/06/2022  f/u ov/[place] office/Illa Enlow re: doe maint on stiolto  / followed now in [facility] clinic Chief Complaint  Patient presents with   Follow-up    Coughing and breathing has improved.  Has had cardiac issues since last visit.   Dyspnea:  still only able to walk about 100 ft flat surfaces slow pace = MMRC3 = can't walk 100 yards even at a slow pace at a flat grade s stopping due to sob   Cough: something   p supper better at hs  Sleeping: about 30 degrees hosp bed  SABA use: has neb not using  02: none  Covid status: vax x 2  Rec We will check your 0xygen level today walking Only use your albuterol-ipatropium nebulizer as a rescue medication  Ok to leave off stiolto    10/28/2022  f/u ov/[place] office/Lorin Hauck re: doe with nl pfts p spiriva 12/08/21  maint on ***  No chief complaint on file.   Dyspnea:  *** Cough: *** Sleeping: *** SABA use: *** 02: *** Covid status: *** Lung cancer screening: ***   No obvious day to day or daytime variability or assoc excess/ purulent sputum or mucus plugs or hemoptysis or cp or chest tightness, subjective wheeze or overt sinus or hb symptoms.   *** without nocturnal  or early am exacerbation  of respiratory  c/o's or need for noct saba. Also denies any obvious fluctuation of symptoms with weather or environmental changes or other aggravating or alleviating factors except as outlined above   No unusual exposure hx or h/o childhood pna/ asthma or knowledge of premature birth.  Current Allergies, Complete Past Medical History, Past Surgical History, Family History, and Social History were reviewed in [employer] record.  ROS  The following are not active complaints unless bolded Hoarseness, sore throat, dysphagia, dental problems, itching, sneezing,  nasal congestion or discharge of excess mucus or purulent secretions, ear ache,   fever, chills, sweats, unintended wt loss or wt gain, classically pleuritic or exertional cp,  orthopnea pnd or arm/hand swelling  or leg swelling, presyncope, palpitations, abdominal pain, anorexia, nausea, vomiting, diarrhea  or change in bowel habits or change in bladder habits, change in stools or change in urine, dysuria, hematuria,  rash, arthralgias, visual complaints, headache, numbness, weakness or ataxia or problems with walking or coordination,  change in mood or  memory.        No outpatient  medications have been marked as taking for the 10/28/22 encounter (Appointment) with [name], MD.                    Objective:     Wts  10/28/2022        ***  01/06/22 118 lb 6.4 oz (53.7 kg)  12/18/21 119 lb (54 kg)  11/24/21 123 lb 12.8 oz (56.2 kg)    Vital signs reviewed  10/28/2022  - Note at rest 02 sats  ***% on ***   General appearance:    ***      S3 gallop / 2/6 ***                      Assessment

## 2022-10-28 ENCOUNTER — Ambulatory Visit: Payer: 59 | Admitting: Internal Medicine

## 2022-11-07 NOTE — Progress Notes (Deleted)
Cardiology Office Note Date:  11/07/2022  Patient ID:  Stacey, Hartman 1956/07/10, MRN [id number] PCP:  Vidal Schwalbe, MD  Cardiologist:  Dr. Aundra Dubin Electrophysiologist: Dr. Caryl Comes    Chief Complaint:  *** 1 mo  History of Present Illness: Stacey Hartman is a 68 y.o. female with history of COPD (former smoker), chronic CHF (systolic), hypothyroidism, LBBB, stroke, Raynaud's, stroke  --Referred to AHF team for DOE, Echo 2/23 EF < 20% RV normal.   --Sent to ED from cardiology office after echo showed EF < 20%. With c/o CP and SOB in waiting room/triage and taken for urgent R/LHC.  --Cardiac cath with no significant CAD, normal PA and PCWP, LVEDP 28 mmHg, CO preserved.   --cMRI with LVEF 12%, no myocardial LGE, elevated T1 indices, ECV 42% in basal septum.  Developed acute left sided facial numbness during cMRI. Code stroke called.  --MRI brain with punctate acute infarct in high right frontal cortex.  No AF detected during admit.  --30-day monitor arranged to evaluate for arrhythmias, showed no AF either.  --PYP obtained and equivocal for transthyretin amyloidosis (grade 1, H/CL 1.47).   TTR gene testing negative  Echo 5/23, EF remained < 20%, moderate LV dilation, normal RV, IVC normal  6/23 with Dr. Caryl Comes to discuss CRT-D. Concern for on-going weight loss and CT chest A/P ordered to rule out malignancy before proceeding with CRT. This showed rectal wall thickening, concerning for malignancy, however no other findings to explain weight loss.  She saw GI, noted to have an unremarkable c-scope about a year ago.  GI thinks unlikely malignancy but has arranged for sigmoidoscopy.   She saw Dr. Aundra Dubin 05/10/22, a little more SOB, winded with stairs, long walks, + orthonea is chronic Meds adjusted, did not think BP would tolerate Entresto  05/24/22 flex sigmoidoscopy The entire examined colon is normal. - No specimens collected. Normal rectum. CT artifact. No source seen for weight  loss, which may be due to the patient's advanced heart failure.  She saw L. Suzie Portela, PA-C 06/07/22, discussed progressive DOE, slight worsening exertional capacity, not acutely volume OL, no med changes made.  ICD implanted 06/21/22  She saw HF team 08/06/22, device with stable volume status, BP 99%, some ongoing DOE< discussed cards vs pulm etiology and planned for CPX  She had a televisit with Dr. Caryl Comes 09/21/22, pt reported improved functional status, reporyted LLE swelling/erythema, both were improved but not resolved.  Planned for doppler and EP APP visit for device programming  Venous doppler was negative for DVT  *** reduce outputs *** ? AFib (hx of stroke) *** symptoms *** volume *** BP%  Device information Abbott CRT-D implanted 06/21/22  Past Medical History:  Diagnosis Date   Anxiety    Chronic systolic CHF (congestive heart failure), NYHA class 3 (HCC)    Fibromyalgia    Hypothyroid    LBBB (left bundle branch block)    NICM (nonischemic cardiomyopathy) (HCC)    Thyroid disease    Weight loss, non-intentional     Past Surgical History:  Procedure Laterality Date   ANKLE SURGERY Right    BIV ICD INSERTION CRT-D N/A 06/21/2022   Procedure: BIV ICD INSERTION CRT-D;  Surgeon: Deboraha Sprang, MD;  Location: [facility];  Service: Cardiovascular;  Laterality: N/A;   CHOLECYSTECTOMY     FLEXIBLE SIGMOIDOSCOPY N/A 05/24/2022   Procedure: FLEXIBLE SIGMOIDOSCOPY;  Surgeon: Doran Stabler, MD;  Location: [facility];  Service: Gastroenterology;  Laterality: N/A;  NO SEDATION   RIGHT/LEFT HEART CATH AND CORONARY ANGIOGRAPHY N/A 10/05/2021   Procedure: RIGHT/LEFT HEART CATH AND CORONARY ANGIOGRAPHY;  Surgeon: Larey Dresser, MD;  Location: [facility];  Service: Cardiovascular;  Laterality: N/A;   TUBAL LIGATION      Current Outpatient Medications  Medication Sig Dispense Refill   atorvastatin (LIPITOR) 40 MG tablet Take 1 tablet (40 mg total) by mouth  daily. 90 tablet 3   carboxymethylcellulose (REFRESH TEARS) 0.5 % SOLN Place 1 drop into both eyes 3 (three) times daily as needed (dry eyes).     clonazePAM (KLONOPIN) 0.5 MG tablet Take 0.5 mg by mouth 2 (two) times daily.     clopidogrel (PLAVIX) 75 MG tablet Take 1 tablet (75 mg total) by mouth daily. 30 tablet 6   dapagliflozin propanediol (FARXIGA) 10 MG TABS tablet Take 1 tablet (10 mg total) by mouth daily. 90 tablet 3   furosemide (LASIX) 20 MG tablet Take 1 tablet (20 mg total) by mouth daily. 90 tablet 3   gabapentin (NEURONTIN) 300 MG capsule Take 300 mg by mouth at bedtime.     levothyroxine (SYNTHROID) 150 MCG tablet TAKE (1) TABLET BY MOUTH DAILY BEFORE BREAKFAST. 30 tablet 11   losartan (COZAAR) 25 MG tablet Take 0.5 tablets (12.5 mg total) by mouth at bedtime. 15 tablet 5   metoprolol succinate (TOPROL-XL) 25 MG 24 hr tablet Take 1 tablet (25 mg total) by mouth at bedtime. Take with or immediately following a meal. 90 tablet 3   mirtazapine (REMERON) 7.5 MG tablet Take 7.5 mg by mouth at bedtime.     spironolactone (ALDACTONE) 25 MG tablet Take 1 tablet (25 mg total) by mouth daily. 90 tablet 3   No current facility-administered medications for this visit.    Allergies:   Penicillins and Sulfa antibiotics   Social History:  The patient  reports that she has quit smoking. Her smoking use included cigarettes. She has been exposed to tobacco smoke. She has never used smokeless tobacco. She reports that she does not currently use alcohol. She reports that she does not use drugs.   Family History:  The patient's family history includes Colon cancer in her maternal grandmother; Healthy in her son; Heart attack in her father; Heart disease in her father; Heart failure in her brother; Melanoma in her brother, mother, and sister; Stomach cancer in her maternal uncle.  ROS:  Please see the history of present illness.    All other systems are reviewed and otherwise negative.   PHYSICAL  EXAM:  VS:  LMP  (LMP Unknown)  BMI: There is no height or weight on file to calculate BMI. Well nourished, well developed, in no acute distress HEENT: normocephalic, atraumatic Neck: no JVD, carotid bruits or masses Cardiac: ***  RRR; no significant murmurs, no rubs, or gallops Lungs: ***  CTA b/l, no wheezing, rhonchi or rales Abd: soft, nontender MS: no deformity or  atrophy Ext: *** no edema Skin: warm and dry, no rash Neuro:  No gross deficits appreciated Psych: euthymic mood, full affect  ICD site: *** stable without erythema, edema, tethering, tenderness  EKG:  not done today  Device interrogation done today and reviewed by myself ***    Recent Labs: 06/07/2022: B Natriuretic Peptide 442.0 06/14/2022: Hemoglobin 15.3; Platelets 212 08/06/2022: BUN 12; Creatinine, Ser 0.71; Potassium 4.3; Sodium 140  01/26/2022: Cholesterol 114; HDL 47; LDL Cholesterol 48; Total CHOL/HDL Ratio 2.4; Triglycerides 93; VLDL 19  CrCl cannot be calculated (Patient's most recent lab result is older than the maximum 21 days allowed.).   Wt Readings from Last 3 Encounters:  08/06/22 113 lb 12.8 oz (51.6 kg)  06/21/22 113 lb (51.3 kg)  06/14/22 113 lb 12.8 oz (51.6 kg)     Other studies reviewed: Additional studies/records reviewed today include: summarized above  ASSESSMENT AND PLAN:  NICM LBBB Chronic CHF (systolic) ***  4. ICD ***  Disposition: ***  Current medicines are reviewed at length with the patient today.  The patient did not have any concerns regarding medicines.  Venetia Night, PA-C 11/07/2022 9:51 AM     [facility] [address] [place] [address] [phone] (office)  [phone] (fax)

## 2022-11-08 ENCOUNTER — Encounter: Payer: 59 | Admitting: Physician Assistant

## 2022-11-08 NOTE — Progress Notes (Signed)
Remote ICD transmission.   

## 2022-11-15 NOTE — Progress Notes (Addendum)
Cardiology Office Note Date:  11/22/2022  Patient ID:  Stacey, Hartman Dec 27, 1955, MRN [id number] PCP:  Vidal Schwalbe, MD  Cardiologist:  Berniece Salines, DO HF Cardiologist: Loralie Champagne, MD Electrophysiologist: Virl Axe, MD   Chief Complaint: 56mon post-CRT-D insertion  History of Present Illness: Stacey Hartman is a 67 y.o. female with PMH notable for HFrEF, LBBB s/p CRT-D, COPD, CVA, hypothyroid; seen today for Virl Axe, MD for routine electrophysiology followup.  She is s/p [employer] CRT-D for primary prevention 05/2022.  She had e-visit with Dr. Caryl Comes 08/2022, was doing well. Concern for L DVT with increased L leg edema. He ordered a L venous u/s that was negative for DVT.   Today, she states that she is more SOB than she has been previously, feels like she's having a hard time getting air in. Sleeping with HOB elevated on 2 pillows. Good appetite, no abd bloating. Has some dependent edema at the end of the day, but goes away by morning. She has called her pulm MD and is awaiting an appt. She is past due for f/up with HF.   she denies chest pain, palpitations, PND, orthopnea, nausea, vomiting, dizziness, syncope, weight gain, or early satiety.     Device Information: Abbott CRT-D, imp 05/2022; dx NICM and CHF, LBBB   Past Medical History:  Diagnosis Date   Anxiety    Chronic systolic CHF (congestive heart failure), NYHA class 3 (HCC)    Fibromyalgia    Hypothyroid    LBBB (left bundle branch block)    NICM (nonischemic cardiomyopathy) ([place])    Thyroid disease    Weight loss, non-intentional     Past Surgical History:  Procedure Laterality Date   ANKLE SURGERY Right    BIV ICD INSERTION CRT-D N/A 06/21/2022   Procedure: BIV ICD INSERTION CRT-D;  Surgeon: Deboraha Sprang, MD;  Location: [facility] [facility];  Service: Cardiovascular;  Laterality: N/A;   CHOLECYSTECTOMY     FLEXIBLE SIGMOIDOSCOPY N/A 05/24/2022   Procedure: FLEXIBLE SIGMOIDOSCOPY;  Surgeon: Doran Stabler, MD;  Location: [facility];  Service: Gastroenterology;  Laterality: N/A;  NO SEDATION   RIGHT/LEFT HEART CATH AND CORONARY ANGIOGRAPHY N/A 10/05/2021   Procedure: RIGHT/LEFT HEART CATH AND CORONARY ANGIOGRAPHY;  Surgeon: Larey Dresser, MD;  Location: [facility];  Service: Cardiovascular;  Laterality: N/A;   TUBAL LIGATION      Current Outpatient Medications  Medication Instructions   atorvastatin (LIPITOR) 40 mg, Oral, Daily   carboxymethylcellulose (REFRESH TEARS) 0.5 % SOLN 1 drop, Both Eyes, 3 times daily PRN   clonazePAM (KLONOPIN) 0.5 mg, Oral, 2 times daily   clopidogrel (PLAVIX) 75 mg, Oral, Daily   dapagliflozin propanediol (FARXIGA) 10 mg, Oral, Daily   esomeprazole (NEXIUM) 20 mg, Oral, Daily PRN   furosemide (LASIX) 20 mg, Oral, Daily   gabapentin (NEURONTIN) 300 mg, Oral, Daily at bedtime   levothyroxine (SYNTHROID) 150 MCG tablet TAKE (1) TABLET BY MOUTH DAILY BEFORE BREAKFAST.   losartan (COZAAR) 12.5 mg, Oral, Daily at bedtime   metoprolol succinate (TOPROL-XL) 25 mg, Oral, Daily at bedtime, Take with or immediately following a meal.   mirtazapine (REMERON) 7.5 mg, Oral, Daily at bedtime   spironolactone (ALDACTONE) 25 mg, Oral, Daily      Social History:  The patient  reports that she has quit smoking. Her smoking use included cigarettes. She has been exposed to tobacco smoke. She has never used smokeless tobacco. She reports that she  does not currently use alcohol. She reports that she does not use drugs.   Family History:  The patient's family history includes Colon cancer in her maternal grandmother; Healthy in her son; Heart attack in her father at 76; Heart disease in her father; Heart failure in her brother (diag young); Melanoma in her brother, mother, and sister; Stomach cancer in her maternal uncle.  ROS:  Please see the history of present illness. All other systems are reviewed and otherwise negative.   PHYSICAL EXAM:  VS:  BP 112/66    Pulse 78   Ht 5\' 3"  (1.6 m)   Wt 111 lb 9.6 oz (50.6 kg)   LMP  (LMP Unknown)   SpO2 96%   BMI 19.77 kg/m  BMI: Body mass index is 19.77 kg/m.  GEN- The patient is well appearing, alert and oriented x 3 today.   HEENT: normocephalic, atraumatic; sclera clear, conjunctiva pink; hearing intact; oropharynx clear; neck supple, no JVP Lungs- Clear to ausculation bilaterally, normal work of breathing.  No wheezes, rales, rhonchi Heart- Regular rate and rhythm, no murmurs, rubs or gallops, PMI not laterally displaced GI- soft, non-tender, non-distended, bowel sounds present, no hepatosplenomegaly Extremities- No peripheral edema. no clubbing or cyanosis; DP/PT/radial pulses 2+ bilaterally MS- no significant deformity or atrophy Skin- warm and dry, no rash or lesion, device pocket well-healed Psych- euthymic mood, full affect Neuro- strength and sensation are intact   Device interrogation done today and reviewed by myself:  Battery good Lead thresholds, impedence, sensing stable  BiV pacing 99% Some FF sensing on atrial lead  Lower pulse amplitudes from implantation levels    EKG is not ordered.   Recent Labs: 06/07/2022: B Natriuretic Peptide 442.0 06/14/2022: Hemoglobin 15.3; Platelets 212 08/06/2022: BUN 12; Creatinine, Ser 0.71; Potassium 4.3; Sodium 140  01/26/2022: Cholesterol 114; HDL 47; LDL Cholesterol 48; Total CHOL/HDL Ratio 2.4; Triglycerides 93; VLDL 19   CrCl cannot be calculated (Patient's most recent lab result is older than the maximum 21 days allowed.).   Wt Readings from Last 3 Encounters:  11/22/22 111 lb 9.6 oz (50.6 kg)  08/06/22 113 lb 12.8 oz (51.6 kg)  06/21/22 113 lb (51.3 kg)     Additional studies reviewed include: Previous EP, cardiology notes.   TTE 01/26/2022 1. Left ventricular ejection fraction, by estimation, is <20%. The left ventricle has severely decreased function. The left ventricle demonstrates global hypokinesis. The left ventricular  internal cavity size was moderately dilated. Left ventricular diastolic parameters are consistent with Grade I diastolic dysfunction (impaired relaxation). The average left ventricular global longitudinal strain is -10.2 %. The global longitudinal strain is abnormal.   2. Right ventricular systolic function is normal. The right ventricular size is normal. Tricuspid regurgitation signal is inadequate for assessing PA pressure.   3. Left atrial size was mildly dilated.   4. A small pericardial effusion is present.   5. The mitral valve is normal in structure. Trivial mitral valve regurgitation. No evidence of mitral stenosis.   6. The aortic valve is tricuspid. Aortic valve regurgitation is not visualized. No aortic stenosis is present.   7. The inferior vena cava is normal in size with greater than 50% respiratory variability, suggesting right atrial pressure of 3 mmHg.   ASSESSMENT AND PLAN:  #) HFrEF  NICM  LBBB s/p CRT-D Device functioning well, see paceart for details Some FF sensing on atrial lead CoreVue stable High BiV pacing Does not appear fluid overloaded by exam - no edema, lungs CTA  #)  SOB Unclear etiology. She has called pulm clinic for appointment to eval.   Current medicines are reviewed at length with the patient today.   The patient does not have concerns regarding her medicines.  The following changes were made today:  none  Labs/ tests ordered today include:  No orders of the defined types were placed in this encounter.    Disposition: Follow up with Dr. Caryl Comes in in 6 months   Signed, Mamie Levers, NP  11/22/22  11:09 AM  Electrophysiology [facility]   11/26/22 ADDENDUM: On re-review of device interrogation, there is concern that LV was not fully captured during threshold testing starting at 2.61mV at 0.39ms. The patient was called and asked to come in for re-interrogation, but was unavailable this week. She has been scheduled with PA Tillery for  12/02/22.

## 2022-11-16 DIAGNOSIS — Z950 Presence of cardiac pacemaker: Secondary | ICD-10-CM | POA: Insufficient documentation

## 2022-11-22 ENCOUNTER — Ambulatory Visit: Payer: 59 | Attending: Student | Admitting: Cardiology

## 2022-11-22 ENCOUNTER — Encounter: Payer: Self-pay | Admitting: Cardiology

## 2022-11-22 VITALS — BP 112/66 | HR 78 | Ht 63.0 in | Wt 111.6 lb

## 2022-11-22 DIAGNOSIS — Z950 Presence of cardiac pacemaker: Secondary | ICD-10-CM | POA: Diagnosis not present

## 2022-11-22 DIAGNOSIS — I5022 Chronic systolic (congestive) heart failure: Secondary | ICD-10-CM

## 2022-11-22 DIAGNOSIS — I428 Other cardiomyopathies: Secondary | ICD-10-CM

## 2022-11-22 DIAGNOSIS — I447 Left bundle-branch block, unspecified: Secondary | ICD-10-CM

## 2022-11-22 DIAGNOSIS — R0609 Other forms of dyspnea: Secondary | ICD-10-CM

## 2022-11-22 LAB — CUP PACEART INCLINIC DEVICE CHECK
Battery Remaining Longevity: 76 mo
Brady Statistic RA Percent Paced: 9.9 %
Brady Statistic RV Percent Paced: 99.3 %
Date Time Interrogation Session: 20240325111225
HighPow Impedance: 59.625
Implantable Lead Connection Status: 753985
Implantable Lead Connection Status: 753985
Implantable Lead Connection Status: 753985
Implantable Lead Implant Date: 20231023
Implantable Lead Implant Date: 20231023
Implantable Lead Implant Date: 20231023
Implantable Lead Location: 753858
Implantable Lead Location: 753859
Implantable Lead Location: 753860
Implantable Lead Model: 7122
Implantable Pulse Generator Implant Date: 20231023
Lead Channel Impedance Value: 475 Ohm
Lead Channel Impedance Value: 487.5 Ohm
Lead Channel Impedance Value: 900 Ohm
Lead Channel Pacing Threshold Amplitude: 0.25 V
Lead Channel Pacing Threshold Amplitude: 0.25 V
Lead Channel Pacing Threshold Amplitude: 0.5 V
Lead Channel Pacing Threshold Amplitude: 0.5 V
Lead Channel Pacing Threshold Amplitude: 0.75 V
Lead Channel Pacing Threshold Pulse Width: 0.5 ms
Lead Channel Pacing Threshold Pulse Width: 0.5 ms
Lead Channel Pacing Threshold Pulse Width: 0.5 ms
Lead Channel Pacing Threshold Pulse Width: 0.5 ms
Lead Channel Pacing Threshold Pulse Width: 0.5 ms
Lead Channel Sensing Intrinsic Amplitude: 12 mV
Lead Channel Sensing Intrinsic Amplitude: 2.6 mV
Lead Channel Setting Pacing Amplitude: 2 V
Lead Channel Setting Pacing Amplitude: 2 V
Lead Channel Setting Pacing Amplitude: 2.5 V
Lead Channel Setting Pacing Pulse Width: 0.5 ms
Lead Channel Setting Pacing Pulse Width: 0.5 ms
Lead Channel Setting Sensing Sensitivity: 0.5 mV
Pulse Gen Serial Number: 8949490
Zone Setting Status: 755011

## 2022-11-22 NOTE — Patient Instructions (Signed)
Medication Instructions:   Your physician recommends that you continue on your current medications as directed. Please refer to the Current Medication list given to you today.  *If you need a refill on your cardiac medications before your next appointment, please call your pharmacy*   Lab Work: Stacey Hartman   If you have labs (blood work) drawn today and your tests are completely normal, you will receive your results only by: Stacey Hartman (if you have MyChart) OR A paper copy in the mail If you have any lab test that is abnormal or we need to change your treatment, we will call you to review the results.   Testing/Procedures: NONE ORDERED  TODAY    Follow-Up: At [facility], you and your health needs are our priority.  As part of our continuing mission to provide you with exceptional heart care, we have created designated Provider Care Teams.  These Care Teams include your primary Cardiologist (physician) and Advanced Practice Providers (APPs -  Physician Assistants and Nurse Practitioners) who all work together to provide you with the care you need, when you need it.  We recommend signing up for the patient portal called "MyChart".  Sign up information is provided on this After Visit Summary.  MyChart is used to connect with patients for Virtual Visits (Telemedicine).  Patients are able to view lab/test results, encounter notes, upcoming appointments, etc.  Non-urgent messages can be sent to your provider as well.   To learn more about what you can do with MyChart, go to [name].    Your next appointment: DUE HEART FAILURE ( RECALL) NEXT AVAILABLE  6 month(s)  Provider:   You may see Stacey Axe, MD or one of the following Advanced Practice Providers on your designated Care Team:   Stacey Hartman, Stacey Hartman, Stacey  Other Instructions

## 2022-11-23 ENCOUNTER — Other Ambulatory Visit (HOSPITAL_COMMUNITY): Payer: Self-pay

## 2022-11-23 MED ORDER — LOSARTAN POTASSIUM 25 MG PO TABS: 12.5000 mg | ORAL_TABLET | Freq: Every day | ORAL | 5 refills | Status: DC

## 2022-11-23 NOTE — Progress Notes (Deleted)
Cardiology Office Note Date:  11/07/2022  Patient ID:  Stacey Hartman, Stacey Hartman 10-03-1955, MRN [id number] PCP:  Vidal Schwalbe, MD  Cardiologist:  Dr. Aundra Dubin Electrophysiologist: Dr. Caryl Comes    Chief Complaint:  *** 1 mo  History of Present Illness: Stacey Hartman is a 67 y.o. female with history of COPD (former smoker), chronic CHF (systolic), hypothyroidism, LBBB, stroke, Raynaud's, stroke  --Referred to AHF team for DOE, Echo 2/23 EF < 20% RV normal.   --Sent to ED from cardiology office after echo showed EF < 20%. With c/o CP and SOB in waiting room/triage and taken for urgent R/LHC.  --Cardiac cath with no significant CAD, normal PA and PCWP, LVEDP 28 mmHg, CO preserved.   --cMRI with LVEF 12%, no myocardial LGE, elevated T1 indices, ECV 42% in basal septum.  Developed acute left sided facial numbness during cMRI. Code stroke called.  --MRI brain with punctate acute infarct in high right frontal cortex.  No AF detected during admit.  --30-day monitor arranged to evaluate for arrhythmias, showed no AF either.  --PYP obtained and equivocal for transthyretin amyloidosis (grade 1, H/CL 1.47).   TTR gene testing negative  Echo 5/23, EF remained < 20%, moderate LV dilation, normal RV, IVC normal  6/23 with Dr. Caryl Comes to discuss CRT-D. Concern for on-going weight loss and CT chest A/P ordered to rule out malignancy before proceeding with CRT. This showed rectal wall thickening, concerning for malignancy, however no other findings to explain weight loss.  She saw GI, noted to have an unremarkable c-scope about a year ago.  GI thinks unlikely malignancy but has arranged for sigmoidoscopy.   She saw Dr. Aundra Dubin 05/10/22, a little more SOB, winded with stairs, long walks, + orthonea is chronic Meds adjusted, did not think BP would tolerate Entresto  05/24/22 flex sigmoidoscopy The entire examined colon is normal. - No specimens collected. Normal rectum. CT artifact. No source seen for weight  loss, which may be due to the patient's advanced heart failure.  She saw L. Suzie Portela, PA-C 06/07/22, discussed progressive DOE, slight worsening exertional capacity, not acutely volume OL, no med changes made.  ICD implanted 06/21/22  She saw HF team 08/06/22, device with stable volume status, BP 99%, some ongoing DOE< discussed cards vs pulm etiology and planned for CPX  She had a televisit with Dr. Caryl Comes 09/21/22, pt reported improved functional status, reporyted LLE swelling/erythema, both were improved but not resolved.  Planned for doppler and EP APP visit for device programming  Venous doppler was negative for DVT  She saw S. Riddle, NP 11/22/22, reported some increased SOB, sleeping with 2 pillows. No other symptoms, and by exam not felt to be overtly volume OL, was due to see the HF team, no changes were made to her meds. Noted some infrequent FF on the A channel with false AMS, otherwise felt to have stable device findings. Acute implant lead outputs reduced to clinic standard/safety margins  In further review after the patient left, LV threshold test result unclear and called to come back in to recheck LV threshold, revisit volume status, OptiVol.  *** ? AFib (hx of stroke) *** symptoms *** volume *** BP%  Device information Abbott CRT-D implanted 06/21/22  Past Medical History:  Diagnosis Date   Anxiety    Chronic systolic CHF (congestive heart failure), NYHA class 3 (HCC)    Fibromyalgia    Hypothyroid    LBBB (left bundle branch block)    NICM (nonischemic cardiomyopathy) (  [place])    Thyroid disease    Weight loss, non-intentional     Past Surgical History:  Procedure Laterality Date   ANKLE SURGERY Right    BIV ICD INSERTION CRT-D N/A 06/21/2022   Procedure: BIV ICD INSERTION CRT-D;  Surgeon: Deboraha Sprang, MD;  Location: [place] [facility];  Service: Cardiovascular;  Laterality: N/A;   CHOLECYSTECTOMY     FLEXIBLE SIGMOIDOSCOPY N/A 05/24/2022   Procedure: FLEXIBLE  SIGMOIDOSCOPY;  Surgeon: Doran Stabler, MD;  Location: [place];  Service: Gastroenterology;  Laterality: N/A;  NO SEDATION   RIGHT/LEFT HEART CATH AND CORONARY ANGIOGRAPHY N/A 10/05/2021   Procedure: RIGHT/LEFT HEART CATH AND CORONARY ANGIOGRAPHY;  Surgeon: Larey Dresser, MD;  Location: [place] [facility];  Service: Cardiovascular;  Laterality: N/A;   TUBAL LIGATION      Current Outpatient Medications  Medication Sig Dispense Refill   atorvastatin (LIPITOR) 40 MG tablet Take 1 tablet (40 mg total) by mouth daily. 90 tablet 3   carboxymethylcellulose (REFRESH TEARS) 0.5 % SOLN Place 1 drop into both eyes 3 (three) times daily as needed (dry eyes).     clonazePAM (KLONOPIN) 0.5 MG tablet Take 0.5 mg by mouth 2 (two) times daily.     clopidogrel (PLAVIX) 75 MG tablet Take 1 tablet (75 mg total) by mouth daily. 30 tablet 6   dapagliflozin propanediol (FARXIGA) 10 MG TABS tablet Take 1 tablet (10 mg total) by mouth daily. 90 tablet 3   furosemide (LASIX) 20 MG tablet Take 1 tablet (20 mg total) by mouth daily. 90 tablet 3   gabapentin (NEURONTIN) 300 MG capsule Take 300 mg by mouth at bedtime.     levothyroxine (SYNTHROID) 150 MCG tablet TAKE (1) TABLET BY MOUTH DAILY BEFORE BREAKFAST. 30 tablet 11   losartan (COZAAR) 25 MG tablet Take 0.5 tablets (12.5 mg total) by mouth at bedtime. 15 tablet 5   metoprolol succinate (TOPROL-XL) 25 MG 24 hr tablet Take 1 tablet (25 mg total) by mouth at bedtime. Take with or immediately following a meal. 90 tablet 3   mirtazapine (REMERON) 7.5 MG tablet Take 7.5 mg by mouth at bedtime.     spironolactone (ALDACTONE) 25 MG tablet Take 1 tablet (25 mg total) by mouth daily. 90 tablet 3   No current facility-administered medications for this visit.    Allergies:   Penicillins and Sulfa antibiotics   Social History:  The patient  reports that she has quit smoking. Her smoking use included cigarettes. She has been exposed to tobacco smoke. She has never  used smokeless tobacco. She reports that she does not currently use alcohol. She reports that she does not use drugs.   Family History:  The patient's family history includes Colon cancer in her maternal grandmother; Healthy in her son; Heart attack in her father; Heart disease in her father; Heart failure in her brother; Melanoma in her brother, mother, and sister; Stomach cancer in her maternal uncle.  ROS:  Please see the history of present illness.    All other systems are reviewed and otherwise negative.   PHYSICAL EXAM:  VS:  LMP  (LMP Unknown)  BMI: There is no height or weight on file to calculate BMI. Well nourished, well developed, in no acute distress HEENT: normocephalic, atraumatic Neck: no JVD, carotid bruits or masses Cardiac: ***  RRR; no significant murmurs, no rubs, or gallops Lungs: ***  CTA b/l, no wheezing, rhonchi or rales Abd: soft, nontender MS: no deformity or  atrophy Ext: *** no edema Skin: warm and dry, no rash Neuro:  No gross deficits appreciated Psych: euthymic mood, full affect  ICD site: *** stable without erythema, edema, tethering, tenderness  EKG:  not done today  Device interrogation done today and reviewed by myself ***  01/26/22: TTE 1. Left ventricular ejection fraction, by estimation, is <20%. The left  ventricle has severely decreased function. The left ventricle demonstrates  global hypokinesis. The left ventricular internal cavity size was  moderately dilated. Left ventricular  diastolic parameters are consistent with Grade I diastolic dysfunction  (impaired relaxation). The average left ventricular global longitudinal  strain is -10.2 %. The global longitudinal strain is abnormal.   2. Right ventricular systolic function is normal. The right ventricular  size is normal. Tricuspid regurgitation signal is inadequate for assessing  PA pressure.   3. Left atrial size was mildly dilated.   4. A small pericardial effusion is present.   5.  The mitral valve is normal in structure. Trivial mitral valve  regurgitation. No evidence of mitral stenosis.   6. The aortic valve is tricuspid. Aortic valve regurgitation is not  visualized. No aortic stenosis is present.   7. The inferior vena cava is normal in size with greater than 50%  respiratory variability, suggesting right atrial pressure of 3 mmHg.    10/05/21: R/LHC 1. Normal RA pressure and PCWP though LVEDP was elevated.  PCWP was repeated and remained low.  2. Preserved cardiac output.  3. Normal PA pressure.  4. No significant coronary disease.   Recent Labs: 06/07/2022: B Natriuretic Peptide 442.0 06/14/2022: Hemoglobin 15.3; Platelets 212 08/06/2022: BUN 12; Creatinine, Ser 0.71; Potassium 4.3; Sodium 140  01/26/2022: Cholesterol 114; HDL 47; LDL Cholesterol 48; Total CHOL/HDL Ratio 2.4; Triglycerides 93; VLDL 19   CrCl cannot be calculated (Patient's most recent lab result is older than the maximum 21 days allowed.).   Wt Readings from Last 3 Encounters:  08/06/22 113 lb 12.8 oz (51.6 kg)  06/21/22 113 lb (51.3 kg)  06/14/22 113 lb 12.8 oz (51.6 kg)     Other studies reviewed: Additional studies/records reviewed today include: summarized above  ASSESSMENT AND PLAN:  NICM LBBB Chronic CHF (systolic) ***  4. ICD ***  Disposition: ***  Current medicines are reviewed at length with the patient today.  The patient did not have any concerns regarding medicines.  Venetia Night, PA-C 11/07/2022 9:51 AM     [facility] [address] [place] [address] [phone] (office)  [phone] (fax)

## 2022-11-24 ENCOUNTER — Encounter: Payer: 59 | Admitting: Physician Assistant

## 2022-11-25 NOTE — Progress Notes (Deleted)
Cardiology Office Note Date:  11/25/2022  Patient ID:  Stacey Hartman 1956/04/19, MRN [id number] PCP:  Vidal Schwalbe, MD  Cardiologist:  Berniece Salines, DO HF Cardiologist: Loralie Champagne, MD Electrophysiologist: Virl Axe, MD   Chief Complaint: 40mon post-CRT-D insertion  History of Present Illness: Stacey Hartman is a 67 y.o. female with PMH notable for HFrEF, LBBB s/p CRT-D, COPD, CVA, hypothyroid; seen today for Virl Axe, MD for routine electrophysiology followup.  She is s/p [employer] CRT-D for primary prevention 05/2022.  She had e-visit with Dr. Caryl Comes 08/2022, was doing well. Concern for L DVT with increased L leg edema. He ordered a L venous u/s that was negative for DVT.  I saw her earlier this week, was having more SOB than she has been previously, feels like she's having a hard time getting air in. Sleeping with HOB elevated on 2 pillows. Good appetite, no abd bloating. Has some dependent edema at the end of the day, but goes away by morning.  Pulm appt 4/10 for COPD follow-up.  On further review of her device interrogation after her appointment, there was concern for a lack of ventricular capture during threshold testing. She presents today for eval.     she denies chest pain, palpitations, PND, orthopnea, nausea, vomiting, dizziness, syncope, weight gain, or early satiety.     Device Information: Abbott CRT-D, imp 05/2022; dx NICM and CHF, LBBB   Past Medical History:  Diagnosis Date   Anxiety    Chronic systolic CHF (congestive heart failure), NYHA class 3 (HCC)    Fibromyalgia    Hypothyroid    LBBB (left bundle branch block)    NICM (nonischemic cardiomyopathy) ([place])    Thyroid disease    Weight loss, non-intentional     Past Surgical History:  Procedure Laterality Date   ANKLE SURGERY Right    BIV ICD INSERTION CRT-D N/A 06/21/2022   Procedure: BIV ICD INSERTION CRT-D;  Surgeon: Deboraha Sprang, MD;  Location: [place] [facility];  Service:  Cardiovascular;  Laterality: N/A;   CHOLECYSTECTOMY     FLEXIBLE SIGMOIDOSCOPY N/A 05/24/2022   Procedure: FLEXIBLE SIGMOIDOSCOPY;  Surgeon: Doran Stabler, MD;  Location: [place];  Service: Gastroenterology;  Laterality: N/A;  NO SEDATION   RIGHT/LEFT HEART CATH AND CORONARY ANGIOGRAPHY N/A 10/05/2021   Procedure: RIGHT/LEFT HEART CATH AND CORONARY ANGIOGRAPHY;  Surgeon: Larey Dresser, MD;  Location: [place] [facility];  Service: Cardiovascular;  Laterality: N/A;   TUBAL LIGATION      Current Outpatient Medications  Medication Instructions   atorvastatin (LIPITOR) 40 mg, Oral, Daily   carboxymethylcellulose (REFRESH TEARS) 0.5 % SOLN 1 drop, Both Eyes, 3 times daily PRN   clonazePAM (KLONOPIN) 0.5 mg, Oral, 2 times daily   clopidogrel (PLAVIX) 75 mg, Oral, Daily   dapagliflozin propanediol (FARXIGA) 10 mg, Oral, Daily   esomeprazole (NEXIUM) 20 mg, Oral, Daily PRN   furosemide (LASIX) 20 mg, Oral, Daily   gabapentin (NEURONTIN) 300 mg, Oral, Daily at bedtime   levothyroxine (SYNTHROID) 150 MCG tablet TAKE (1) TABLET BY MOUTH DAILY BEFORE BREAKFAST.   losartan (COZAAR) 12.5 mg, Oral, Daily at bedtime   metoprolol succinate (TOPROL-XL) 25 mg, Oral, Daily at bedtime, Take with or immediately following a meal.   mirtazapine (REMERON) 7.5 mg, Oral, Daily at bedtime   spironolactone (ALDACTONE) 25 mg, Oral, Daily      Social History:  The patient  reports that she has quit smoking. Her smoking use  included cigarettes. She has been exposed to tobacco smoke. She has never used smokeless tobacco. She reports that she does not currently use alcohol. She reports that she does not use drugs.   Family History:  The patient's family history includes Colon cancer in her maternal grandmother; Healthy in her son; Heart attack in her father at 79; Heart disease in her father; Heart failure in her brother (diag young); Melanoma in her brother, mother, and sister; Stomach cancer in her maternal  uncle.  ROS:  Please see the history of present illness. All other systems are reviewed and otherwise negative.   PHYSICAL EXAM:  VS:  LMP  (LMP Unknown)  BMI: There is no height or weight on file to calculate BMI.  GEN- The patient is well appearing, alert and oriented x 3 today.   HEENT: normocephalic, atraumatic; sclera clear, conjunctiva pink; hearing intact; oropharynx clear; neck supple, no JVP Lungs- Clear to ausculation bilaterally, normal work of breathing.  No wheezes, rales, rhonchi Heart- Regular rate and rhythm, no murmurs, rubs or gallops, PMI not laterally displaced GI- soft, non-tender, non-distended, bowel sounds present, no hepatosplenomegaly Extremities- No peripheral edema. no clubbing or cyanosis; DP/PT/radial pulses 2+ bilaterally MS- no significant deformity or atrophy Skin- warm and dry, no rash or lesion, device pocket well-healed Psych- euthymic mood, full affect Neuro- strength and sensation are intact   Device interrogation done today and reviewed by myself:  Battery good Lead thresholds, impedence, sensing stable  BiV pacing 99% Some FF sensing on atrial lead  Lower pulse amplitudes from implantation levels    EKG is not ordered.   Recent Labs: 06/07/2022: B Natriuretic Peptide 442.0 06/14/2022: Hemoglobin 15.3; Platelets 212 08/06/2022: BUN 12; Creatinine, Ser 0.71; Potassium 4.3; Sodium 140  01/26/2022: Cholesterol 114; HDL 47; LDL Cholesterol 48; Total CHOL/HDL Ratio 2.4; Triglycerides 93; VLDL 19   CrCl cannot be calculated (Patient's most recent lab result is older than the maximum 21 days allowed.).   Wt Readings from Last 3 Encounters:  11/22/22 111 lb 9.6 oz (50.6 kg)  08/06/22 113 lb 12.8 oz (51.6 kg)  06/21/22 113 lb (51.3 kg)     Additional studies reviewed include: Previous EP, cardiology notes.   TTE 01/26/2022 1. Left ventricular ejection fraction, by estimation, is <20%. The left ventricle has severely decreased function. The left  ventricle demonstrates global hypokinesis. The left ventricular internal cavity size was moderately dilated. Left ventricular diastolic parameters are consistent with Grade I diastolic dysfunction (impaired relaxation). The average left ventricular global longitudinal strain is -10.2 %. The global longitudinal strain is abnormal.   2. Right ventricular systolic function is normal. The right ventricular size is normal. Tricuspid regurgitation signal is inadequate for assessing PA pressure.   3. Left atrial size was mildly dilated.   4. A small pericardial effusion is present.   5. The mitral valve is normal in structure. Trivial mitral valve regurgitation. No evidence of mitral stenosis.   6. The aortic valve is tricuspid. Aortic valve regurgitation is not visualized. No aortic stenosis is present.   7. The inferior vena cava is normal in size with greater than 50% respiratory variability, suggesting right atrial pressure of 3 mmHg.   ASSESSMENT AND PLAN:  #) HFrEF  NICM  LBBB s/p CRT-D Device functioning well, see paceart for details Some FF sensing on atrial lead CoreVue stable High BiV pacing Does not appear fluid overloaded by exam - no edema, lungs CTA  #) SOB Unclear etiology. She has called pulm  clinic for appointment to eval.   Current medicines are reviewed at length with the patient today.   The patient does not have concerns regarding her medicines.  The following changes were made today:  none  Labs/ tests ordered today include:  No orders of the defined types were placed in this encounter.    Disposition: Follow up with Dr. Caryl Comes in in 6 months   Signed, Mamie Levers, NP  11/25/22  1:00 PM  Electrophysiology [facility]

## 2022-11-26 ENCOUNTER — Ambulatory Visit: Payer: 59 | Admitting: Physician Assistant

## 2022-11-26 DIAGNOSIS — I428 Other cardiomyopathies: Secondary | ICD-10-CM

## 2022-11-26 DIAGNOSIS — Z950 Presence of cardiac pacemaker: Secondary | ICD-10-CM

## 2022-11-26 DIAGNOSIS — I5022 Chronic systolic (congestive) heart failure: Secondary | ICD-10-CM

## 2022-11-26 DIAGNOSIS — I447 Left bundle-branch block, unspecified: Secondary | ICD-10-CM

## 2022-11-29 ENCOUNTER — Other Ambulatory Visit (HOSPITAL_COMMUNITY): Payer: Self-pay | Admitting: Internal Medicine

## 2022-12-01 ENCOUNTER — Encounter: Payer: 59 | Admitting: Student

## 2022-12-02 ENCOUNTER — Ambulatory Visit: Payer: 59 | Admitting: Student

## 2022-12-06 ENCOUNTER — Other Ambulatory Visit: Payer: Self-pay | Admitting: *Deleted

## 2022-12-06 DIAGNOSIS — L720 Epidermal cyst: Secondary | ICD-10-CM

## 2022-12-06 NOTE — Progress Notes (Deleted)
Cardiology Office Note Date:  12/06/2022  Patient ID:  Stacey, Hartman 1956/08/17, MRN [id number] PCP:  Smith Robert, MD (Inactive)  Cardiologist:  Dr. Shirlee Latch Electrophysiologist: Dr. Graciela Husbands    Chief Complaint:  *** 1 mo  History of Present Illness: Stacey Hartman is a 67 y.o. female with history of COPD (former smoker), chronic CHF (systolic), hypothyroidism, LBBB, stroke, Raynaud's, stroke  --Referred to AHF team for DOE, Echo 2/23 EF < 20% RV normal.   --Sent to ED from cardiology office after echo showed EF < 20%. With c/o CP and SOB in waiting room/triage and taken for urgent R/LHC.  --Cardiac cath with no significant CAD, normal PA and PCWP, LVEDP 28 mmHg, CO preserved.   --cMRI with LVEF 12%, no myocardial LGE, elevated T1 indices, ECV 42% in basal septum.  Developed acute left sided facial numbness during cMRI. Code stroke called.  --MRI brain with punctate acute infarct in high right frontal cortex.  No AF detected during admit.  --30-day monitor arranged to evaluate for arrhythmias, showed no AF either.  --PYP obtained and equivocal for transthyretin amyloidosis (grade 1, H/CL 1.47).   TTR gene testing negative  Echo 5/23, EF remained < 20%, moderate LV dilation, normal RV, IVC normal  6/23 with Dr. Graciela Husbands to discuss CRT-D. Concern for on-going weight loss and CT chest A/P ordered to rule out malignancy before proceeding with CRT. This showed rectal wall thickening, concerning for malignancy, however no other findings to explain weight loss.  She saw GI, noted to have an unremarkable c-scope about a year ago.  GI thinks unlikely malignancy but has arranged for sigmoidoscopy.   She saw Dr. Shirlee Latch 05/10/22, a little more SOB, winded with stairs, long walks, + orthonea is chronic Meds adjusted, did not think BP would tolerate Entresto  05/24/22 flex sigmoidoscopy The entire examined colon is normal. - No specimens collected. Normal rectum. CT artifact. No source seen  for weight loss, which may be due to the patient's advanced heart failure.  She saw L. Dorothyann Gibbs, PA-C 06/07/22, discussed progressive DOE, slight worsening exertional capacity, not acutely volume OL, no med changes made.  ICD implanted 06/21/22  She saw HF team 08/06/22, device with stable volume status, BP 99%, some ongoing DOE< discussed cards vs pulm etiology and planned for CPX  She had a televisit with Dr. Graciela Husbands 09/21/22, pt reported improved functional status, reporyted LLE swelling/erythema, both were improved but not resolved.  Planned for doppler and EP APP visit for device programming  Venous doppler was negative for DVT  She saw S. Riddle, NP 11/22/22, reported some increased SOB, sleeping with 2 pillows. No other symptoms, and by exam not felt to be overtly volume OL, was due to see the HF team, no changes were made to her meds. Noted some infrequent FF on the A channel with false AMS, otherwise felt to have stable device findings. Acute implant lead outputs reduced to clinic standard/safety margins  In further review after the patient left, LV threshold test result unclear and called to come back in to recheck LV threshold, revisit volume status, OptiVol. She was scheduled and canceled/rescheduled to revisit her LV lead a number of times.  *** ? AFib (hx of stroke) *** symptoms *** volume *** BP%  Device information Abbott CRT-D implanted 06/21/22  Past Medical History:  Diagnosis Date   Anxiety    Chronic systolic CHF (congestive heart failure), NYHA class 3 (HCC)    Fibromyalgia  Hypothyroid    LBBB (left bundle branch block)    NICM (nonischemic cardiomyopathy) (HCC)    Thyroid disease    Weight loss, non-intentional     Past Surgical History:  Procedure Laterality Date   ANKLE SURGERY Right    BIV ICD INSERTION CRT-D N/A 06/21/2022   Procedure: BIV ICD INSERTION CRT-D;  Surgeon: Duke Salvia, MD;  Location: [facility] INVASIVE CV LAB;  Service: Cardiovascular;   Laterality: N/A;   CHOLECYSTECTOMY     FLEXIBLE SIGMOIDOSCOPY N/A 05/24/2022   Procedure: FLEXIBLE SIGMOIDOSCOPY;  Surgeon: Sherrilyn Rist, MD;  Location: [facility] ENDOSCOPY;  Service: Gastroenterology;  Laterality: N/A;  NO SEDATION   RIGHT/LEFT HEART CATH AND CORONARY ANGIOGRAPHY N/A 10/05/2021   Procedure: RIGHT/LEFT HEART CATH AND CORONARY ANGIOGRAPHY;  Surgeon: Laurey Morale, MD;  Location: [facility] INVASIVE CV LAB;  Service: Cardiovascular;  Laterality: N/A;   TUBAL LIGATION      Current Outpatient Medications  Medication Sig Dispense Refill   atorvastatin (LIPITOR) 40 MG tablet Take 1 tablet (40 mg total) by mouth daily. 90 tablet 3   carboxymethylcellulose (REFRESH TEARS) 0.5 % SOLN Place 1 drop into both eyes 3 (three) times daily as needed (dry eyes).     clonazePAM (KLONOPIN) 0.5 MG tablet Take 0.5 mg by mouth 2 (two) times daily.     clopidogrel (PLAVIX) 75 MG tablet TAKE ONE TABLET BY MOUTH EVERY DAY 30 tablet 6   dapagliflozin propanediol (FARXIGA) 10 MG TABS tablet Take 1 tablet (10 mg total) by mouth daily. 90 tablet 3   esomeprazole (NEXIUM) 20 MG capsule Take 20 mg by mouth daily as needed.     furosemide (LASIX) 20 MG tablet Take 1 tablet (20 mg total) by mouth daily. 90 tablet 3   gabapentin (NEURONTIN) 300 MG capsule Take 300 mg by mouth at bedtime.     levothyroxine (SYNTHROID) 150 MCG tablet TAKE (1) TABLET BY MOUTH DAILY BEFORE BREAKFAST. 30 tablet 11   losartan (COZAAR) 25 MG tablet Take 0.5 tablets (12.5 mg total) by mouth at bedtime. 15 tablet 5   metoprolol succinate (TOPROL-XL) 25 MG 24 hr tablet Take 1 tablet (25 mg total) by mouth at bedtime. Take with or immediately following a meal. 90 tablet 3   mirtazapine (REMERON) 7.5 MG tablet Take 7.5 mg by mouth at bedtime.     spironolactone (ALDACTONE) 25 MG tablet Take 1 tablet (25 mg total) by mouth daily. 90 tablet 3   No current facility-administered medications for this visit.    Allergies:   Penicillins and Sulfa  antibiotics   Social History:  The patient  reports that she has quit smoking. Her smoking use included cigarettes. She has been exposed to tobacco smoke. She has never used smokeless tobacco. She reports that she does not currently use alcohol. She reports that she does not use drugs.   Family History:  The patient's family history includes Colon cancer in her maternal grandmother; Healthy in her son; Heart attack in her father; Heart disease in her father; Heart failure in her brother; Melanoma in her brother, mother, and sister; Stomach cancer in her maternal uncle.  ROS:  Please see the history of present illness.    All other systems are reviewed and otherwise negative.   PHYSICAL EXAM:  VS:  LMP  (LMP Unknown)  BMI: There is no height or weight on file to calculate BMI. Well nourished, well developed, in no acute distress HEENT: normocephalic, atraumatic Neck: no JVD, carotid bruits  or masses Cardiac: ***  RRR; no significant murmurs, no rubs, or gallops Lungs: ***  CTA b/l, no wheezing, rhonchi or rales Abd: soft, nontender MS: no deformity or  atrophy Ext: *** no edema Skin: warm and dry, no rash Neuro:  No gross deficits appreciated Psych: euthymic mood, full affect  ICD site: *** stable without erythema, edema, tethering, tenderness  EKG:  not done today  Device interrogation done today and reviewed by myself ***  01/26/22: TTE 1. Left ventricular ejection fraction, by estimation, is <20%. The left  ventricle has severely decreased function. The left ventricle demonstrates  global hypokinesis. The left ventricular internal cavity size was  moderately dilated. Left ventricular  diastolic parameters are consistent with Grade I diastolic dysfunction  (impaired relaxation). The average left ventricular global longitudinal  strain is -10.2 %. The global longitudinal strain is abnormal.   2. Right ventricular systolic function is normal. The right ventricular  size is  normal. Tricuspid regurgitation signal is inadequate for assessing  PA pressure.   3. Left atrial size was mildly dilated.   4. A small pericardial effusion is present.   5. The mitral valve is normal in structure. Trivial mitral valve  regurgitation. No evidence of mitral stenosis.   6. The aortic valve is tricuspid. Aortic valve regurgitation is not  visualized. No aortic stenosis is present.   7. The inferior vena cava is normal in size with greater than 50%  respiratory variability, suggesting right atrial pressure of 3 mmHg.    10/05/21: R/LHC 1. Normal RA pressure and PCWP though LVEDP was elevated.  PCWP was repeated and remained low.  2. Preserved cardiac output.  3. Normal PA pressure.  4. No significant coronary disease.   Recent Labs: 06/07/2022: B Natriuretic Peptide 442.0 06/14/2022: Hemoglobin 15.3; Platelets 212 08/06/2022: BUN 12; Creatinine, Ser 0.71; Potassium 4.3; Sodium 140  01/26/2022: Cholesterol 114; HDL 47; LDL Cholesterol 48; Total CHOL/HDL Ratio 2.4; Triglycerides 93; VLDL 19   CrCl cannot be calculated (Patient's most recent lab result is older than the maximum 21 days allowed.).   Wt Readings from Last 3 Encounters:  11/22/22 111 lb 9.6 oz (50.6 kg)  08/06/22 113 lb 12.8 oz (51.6 kg)  06/21/22 113 lb (51.3 kg)     Other studies reviewed: Additional studies/records reviewed today include: summarized above  ASSESSMENT AND PLAN:  NICM LBBB Chronic CHF (systolic) ***  4. ICD ***  Disposition: ***  Current medicines are reviewed at length with the patient today.  The patient did not have any concerns regarding medicines.  Stacey FredricksonSigned, Stacey Taves, PA-C 12/06/2022 1:36 PM     [facility] [address] [place] [address] [phone] (office)  [phone] (fax)

## 2022-12-07 NOTE — Progress Notes (Deleted)
Stacey Hartman, female    DOB: 1955-10-01,    MRN: [id number]   Brief patient profile:  89 yowf quit smoking  around 2002 with cough/sob completely improved to point where no symptoms including walking up to several miles including hills but dx fibromyalgia since 2012  then August 2021 salmonella/rx as out pt with "pcn" then severe skin peeling lost 22 lb and persistent diarrhea/sob/ cough referred to pulmonary clinic in [facility]  08/25/2021 by Bryna Colander already under care of skin doctor in [place], GI doctor in [place], rheumatology eval pending.      History of Present Illness  08/25/2021  Pulmonary/ 1st office eval/ Conal Shetley / [place] [facility]  Chief Complaint  Patient presents with   Consult    Hx of COPD. After penicillin has noticed struggle with breathing since then (03/2020).   Coughs up green mucus    Dyspnea:  getting worse to point of 100 ft doe Cough: congested cough worse when lies down  Sleep: bed is flat with 3 pillows  SABA use: spiriva and albuterol helped / has not tried pred for this illness but took it previously for "bronchitis" and seemed to help Omeprazole bid per GI  Rec Stiolto one puff a day x one week and if not better start on 2 puffs each am  Omeprazole 20 mg Take 30- 60 min before your first and last meals of the day  GERD diet reviewed, bed blocks rec  Please remember to go to the lab department    > BNP 442 > echo next :   EF < 20% admitted If you do have ulcerative colitis it may be affecting your airways - discuss with your GI doctor       01/06/2022  f/u ov/[place] office/Corrina Steffensen re: doe maint on stiolto  / followed now in chf clinic Chief Complaint  Patient presents with   Follow-up    Coughing and breathing has improved.  Has had cardiac issues since last visit.   Dyspnea:  still only able to walk about 100 ft flat surfaces slow pace = MMRC3 = can't walk 100 yards even at a slow pace at a flat grade s stopping due to sob   Cough: something   p supper better at hs  Sleeping: about 30 degrees hosp bed  SABA use: has neb not using  02: none  Covid status: vax x 2   Rec Only use your albuterol-ipatropium nebulizer as a rescue medication  Ok to leave off stiolto  F.u pnr   12/08/2022  f/u ov/[place] office/Yareliz Thorstenson re: *** maint on ***  No chief complaint on file.   Dyspnea:  *** Cough: *** Sleeping: *** SABA use: *** 02: *** Covid status: *** Lung cancer screening: ***   No obvious day to day or daytime variability or assoc excess/ purulent sputum or mucus plugs or hemoptysis or cp or chest tightness, subjective wheeze or overt sinus or hb symptoms.   *** without nocturnal  or early am exacerbation  of respiratory  c/o's or need for noct saba. Also denies any obvious fluctuation of symptoms with weather or environmental changes or other aggravating or alleviating factors except as outlined above   No unusual exposure hx or h/o childhood pna/ asthma or knowledge of premature birth.  Current Allergies, Complete Past Medical History, Past Surgical History, Family History, and Social History were reviewed in [employer] record.  ROS  The following are not active complaints unless bolded Hoarseness, sore throat, dysphagia,  dental problems, itching, sneezing,  nasal congestion or discharge of excess mucus or purulent secretions, ear ache,   fever, chills, sweats, unintended wt loss or wt gain, classically pleuritic or exertional cp,  orthopnea pnd or arm/hand swelling  or leg swelling, presyncope, palpitations, abdominal pain, anorexia, nausea, vomiting, diarrhea  or change in bowel habits or change in bladder habits, change in stools or change in urine, dysuria, hematuria,  rash, arthralgias, visual complaints, headache, numbness, weakness or ataxia or problems with walking or coordination,  change in mood or  memory.        No outpatient medications have been marked as taking for the 12/08/22 encounter  (Appointment) with Nyoka Cowden, MD.             Objective:    Wts  12/08/2022       ***   01/06/22 118 lb 6.4 oz (53.7 kg)  12/18/21 119 lb (54 kg)  11/24/21 123 lb 12.8 oz (56.2 kg)   Vital signs reviewed  12/08/2022  - Note at rest 02 sats  ***% on ***   General appearance:    ***    min insp crackles in bases bilaterally S3 gallop / 2/6 ***                      Assessment

## 2022-12-08 ENCOUNTER — Ambulatory Visit: Payer: 59 | Admitting: Internal Medicine

## 2022-12-09 ENCOUNTER — Ambulatory Visit: Payer: 59 | Admitting: General Surgery

## 2022-12-09 ENCOUNTER — Ambulatory Visit: Payer: 59 | Admitting: Physician Assistant

## 2022-12-09 NOTE — Progress Notes (Signed)
Cardiology Office Note Date:  12/06/2022  Patient ID:  Stacey Hartman, Stacey Hartman [birth year], MRN [id number] PCP:  Smith Robert, MD (Inactive)  Cardiologist:  Dr. Shirlee Latch Electrophysiologist: Dr. Graciela Husbands    Chief Complaint:   1 mo, check LV threshold/volume  History of Present Illness: Stacey Hartman is a 67 y.o. female with history of COPD (former smoker), chronic CHF (systolic), hypothyroidism, LBBB, stroke, Raynaud's, stroke  --Referred to AHF team for DOE, Echo 2/23 EF < 20% RV normal.   --Sent to ED from cardiology office after echo showed EF < 20%. With c/o CP and SOB in waiting room/triage and taken for urgent R/LHC.  --Cardiac cath with no significant CAD, normal PA and PCWP, LVEDP 28 mmHg, CO preserved.   --cMRI with LVEF 12%, no myocardial LGE, elevated T1 indices, ECV 42% in basal septum.  Developed acute left sided facial numbness during cMRI. Code stroke called.  --MRI brain with punctate acute infarct in high right frontal cortex.  No AF detected during admit.  --30-day monitor arranged to evaluate for arrhythmias, showed no AF either.  --PYP obtained and equivocal for transthyretin amyloidosis (grade 1, H/CL 1.47).   TTR gene testing negative  Echo 5/23, EF remained < 20%, moderate LV dilation, normal RV, IVC normal  6/23 with Dr. Graciela Husbands to discuss CRT-D. Concern for on-going weight loss and CT chest A/P ordered to rule out malignancy before proceeding with CRT. This showed rectal wall thickening, concerning for malignancy, however no other findings to explain weight loss.  She saw GI, noted to have an unremarkable c-scope about a year ago.  GI thinks unlikely malignancy but has arranged for sigmoidoscopy.   She saw Dr. Shirlee Latch 05/10/22, a little more SOB, winded with stairs, long walks, + orthonea is chronic Meds adjusted, did not think BP would tolerate Entresto  05/24/22 flex sigmoidoscopy The entire examined colon is normal. - No specimens collected. Normal rectum. CT  artifact. No source seen for weight loss, which may be due to the patient's advanced heart failure.  She saw L. Dorothyann Gibbs, PA-C 06/07/22, discussed progressive DOE, slight worsening exertional capacity, not acutely volume OL, no med changes made.  ICD implanted 06/21/22  She saw HF team 08/06/22, device with stable volume status, BP 99%, some ongoing DOE< discussed cards vs pulm etiology and planned for CPX  She had a televisit with Dr. Graciela Husbands 09/21/22, pt reported improved functional status, reporyted LLE swelling/erythema, both were improved but not resolved.  Planned for doppler and EP APP visit for device programming  Venous doppler was negative for DVT  She saw S. Riddle, NP 11/22/22, reported some increased SOB, sleeping with 2 pillows. No other symptoms, and by exam not felt to be overtly volume OL, was due to see the HF team, no changes were made to her meds. Noted some infrequent FF on the A channel with false AMS, otherwise felt to have stable device findings. Acute implant lead outputs reduced to clinic standard/safety margins  In further review after the patient left, LV threshold test result unclear and called to come back in to recheck LV threshold, revisit volume status, OptiVol. She was scheduled and canceled/rescheduled to revisit her LV lead a number of times.  TODAY Overall remains generally fatigued, still get winded with exertion, but all of this is improved post CRT implant.  Just not as good as she would like. He real primary concern is that despite a good appetite and eating well without any real restrictions in  food choices she continues to lose weight.  Her PMD has retired, though did get a call from the office that her TSH done a few months prior was very high, but a recheck last week was wnl.  No CP, palpitations or cardiac awareness No rest SOB No near syncope or syncope.   Device information [employer] CRT-D implanted 06/21/22 LV lead in in [place]  Past Medical  History:  Diagnosis Date   Anxiety    Chronic systolic CHF (congestive heart failure), NYHA class 3 (HCC)    Fibromyalgia    Hypothyroid    LBBB (left bundle branch block)    NICM (nonischemic cardiomyopathy) (HCC)    Thyroid disease    Weight loss, non-intentional     Past Surgical History:  Procedure Laterality Date   ANKLE SURGERY Right    BIV ICD INSERTION CRT-D N/A 06/21/2022   Procedure: BIV ICD INSERTION CRT-D;  Surgeon: Duke SalviaKlein, Steven C, MD;  [facility];  Service: Cardiovascular;  Laterality: N/A;   CHOLECYSTECTOMY     FLEXIBLE SIGMOIDOSCOPY N/A 05/24/2022   Procedure: FLEXIBLE SIGMOIDOSCOPY;  Surgeon: Sherrilyn Ristanis, Henry L III, MD;  [facility];  Service: Gastroenterology;  Laterality: N/A;  NO SEDATION   RIGHT/LEFT HEART CATH AND CORONARY ANGIOGRAPHY N/A 10/05/2021   Procedure: RIGHT/LEFT HEART CATH AND CORONARY ANGIOGRAPHY;  Surgeon: Laurey MoraleMcLean, Dalton S, MD;  [facility];  Service: Cardiovascular;  Laterality: N/A;   TUBAL LIGATION      Current Outpatient Medications  Medication Sig Dispense Refill   atorvastatin (LIPITOR) 40 MG tablet Take 1 tablet (40 mg total) by mouth daily. 90 tablet 3   carboxymethylcellulose (REFRESH TEARS) 0.5 % SOLN Place 1 drop into both eyes 3 (three) times daily as needed (dry eyes).     clonazePAM (KLONOPIN) 0.5 MG tablet Take 0.5 mg by mouth 2 (two) times daily.     clopidogrel (PLAVIX) 75 MG tablet TAKE ONE TABLET BY MOUTH EVERY DAY 30 tablet 6   dapagliflozin propanediol (FARXIGA) 10 MG TABS tablet Take 1 tablet (10 mg total) by mouth daily. 90 tablet 3   esomeprazole (NEXIUM) 20 MG capsule Take 20 mg by mouth daily as needed.     furosemide (LASIX) 20 MG tablet Take 1 tablet (20 mg total) by mouth daily. 90 tablet 3   gabapentin (NEURONTIN) 300 MG capsule Take 300 mg by mouth at bedtime.     levothyroxine (SYNTHROID) 150 MCG tablet TAKE (1) TABLET BY MOUTH DAILY BEFORE BREAKFAST. 30 tablet 11   losartan  (COZAAR) 25 MG tablet Take 0.5 tablets (12.5 mg total) by mouth at bedtime. 15 tablet 5   metoprolol succinate (TOPROL-XL) 25 MG 24 hr tablet Take 1 tablet (25 mg total) by mouth at bedtime. Take with or immediately following a meal. 90 tablet 3   mirtazapine (REMERON) 7.5 MG tablet Take 7.5 mg by mouth at bedtime.     spironolactone (ALDACTONE) 25 MG tablet Take 1 tablet (25 mg total) by mouth daily. 90 tablet 3   No current facility-administered medications for this visit.    Allergies:   Penicillins and Sulfa antibiotics   Social History:  The patient  reports that she has quit smoking. Her smoking use included cigarettes. She has been exposed to tobacco smoke. She has never used smokeless tobacco. She reports that she does not currently use alcohol. She reports that she does not use drugs.   Family History:  The patient's family history includes Colon cancer in  her maternal grandmother; Healthy in her son; Heart attack in her father; Heart disease in her father; Heart failure in her brother; Melanoma in her brother, mother, and sister; Stomach cancer in her maternal uncle.  ROS:  Please see the history of present illness.    All other systems are reviewed and otherwise negative.   PHYSICAL EXAM:  VS:  LMP  (LMP Unknown)  BMI: There is no height or weight on file to calculate BMI. Well nourished, well developed, in no acute distress HEENT: normocephalic, atraumatic Neck: no JVD, carotid bruits or masses Cardiac:  RRR; no significant murmurs, no rubs, or gallops Lungs:  CTA b/l, no wheezing, rhonchi or rales Abd: soft, nontender MS: no deformity, marked, advanced atrophy Ext: no edema Skin: warm and dry, no rash Neuro:  No gross deficits appreciated Psych: euthymic mood, full affect  ICD site: no erythema, edema, tenderness or tethering, no skin thinning  EKG:  done today and reviewed by myself (post programming) SR/V pacing/fusing, QRS , LBBB morphology  Device  interrogation done today and reviewed by myself Battery, RA/RV lead measurements are good LV impedance stable LV threshold 3.5/0.5 and 3.25/0.5 2.75/1.0 2.75/1.0 Programmed 3.5/1.0 Had >99 BP%, though was not not capturing on the LV lead. 81 AMS episodes All available EGMs reviewed, all were FF and old, none since January   01/26/22: TTE 1. Left ventricular ejection fraction, by estimation, is <20%. The left  ventricle has severely decreased function. The left ventricle demonstrates  global hypokinesis. The left ventricular internal cavity size was  moderately dilated. Left ventricular  diastolic parameters are consistent with Grade I diastolic dysfunction  (impaired relaxation). The average left ventricular global longitudinal  strain is -10.2 %. The global longitudinal strain is abnormal.   2. Right ventricular systolic function is normal. The right ventricular  size is normal. Tricuspid regurgitation signal is inadequate for assessing  PA pressure.   3. Left atrial size was mildly dilated.   4. A small pericardial effusion is present.   5. The mitral valve is normal in structure. Trivial mitral valve  regurgitation. No evidence of mitral stenosis.   6. The aortic valve is tricuspid. Aortic valve regurgitation is not  visualized. No aortic stenosis is present.   7. The inferior vena cava is normal in size with greater than 50%  respiratory variability, suggesting right atrial pressure of 3 mmHg.    10/05/21: R/LHC 1. Normal RA pressure and PCWP though LVEDP was elevated.  PCWP was repeated and remained low.  2. Preserved cardiac output.  3. Normal PA pressure.  4. No significant coronary disease.   Recent Labs: 06/07/2022: B Natriuretic Peptide 442.0 06/14/2022: Hemoglobin 15.3; Platelets 212 08/06/2022: BUN 12; Creatinine, Ser 0.71; Potassium 4.3; Sodium 140  01/26/2022: Cholesterol 114; HDL 47; LDL Cholesterol 48; Total CHOL/HDL Ratio 2.4; Triglycerides 93; VLDL 19   CrCl  cannot be calculated (Patient's most recent lab result is older than the maximum 21 days allowed.).   Wt Readings from Last 3 Encounters:  11/22/22 111 lb 9.6 oz (50.6 kg)  08/06/22 113 lb 12.8 oz (51.6 kg)  06/21/22 113 lb (51.3 kg)     Other studies reviewed: Additional studies/records reviewed today include: summarized above  ASSESSMENT AND PLAN:  NICM LBBB Chronic CHF (systolic) She does not appear volume OL CorVue appears to be making a turn up Continue meds  4. ICD As above > LV lead programmed with capture. EKG is less then ideal Will have her back in a  few weeks, re-assess lead, EKG and plan optimization  5. Unintentional weight loss I have urged her to re-visit with GI, discuss further given she is waiting on a new PMD via the old's clinic    Disposition: as above, sooner if needed.  Current medicines are reviewed at length with the patient today.  The patient did not have any concerns regarding medicines.  Norma Fredrickson, PA-C 12/06/2022 1:36 PM     [facility] [address] [place] [address] [phone] (office)  [phone] (fax)

## 2022-12-09 NOTE — Progress Notes (Deleted)
Stacey Hartman, female    DOB: Jan 19, 1956,    MRN: [id number]   Brief patient profile:  28 yowf quit smoking  around 2002 with cough/sob completely improved to point where no symptoms including walking up to several miles including hills but dx fibromyalgia since 2012  then August 2021 salmonella/rx as out pt with "pcn" then severe skin peeling lost 22 lb and persistent diarrhea/sob/ cough referred to pulmonary clinic in [facility]  08/25/2021 by Bryna Colander already under care of skin doctor in [place], GI doctor in [place], rheumatology eval pending.      History of Present Illness  08/25/2021  Pulmonary/ 1st office eval/ Stacey Hartman / [place] [facility]  Chief Complaint  Patient presents with   Consult    Hx of COPD. After penicillin has noticed struggle with breathing since then (03/2020).   Coughs up green mucus    Dyspnea:  getting worse to point of 100 ft doe Cough: congested cough worse when lies down  Sleep: bed is flat with 3 pillows  SABA use: spiriva and albuterol helped / has not tried pred for this illness but took it previously for "bronchitis" and seemed to help Omeprazole bid per GI  Rec Stiolto one puff a day x one week and if not better start on 2 puffs each am  Omeprazole 20 mg Take 30- 60 min before your first and last meals of the day  GERD diet reviewed, bed blocks rec  Please remember to go to the lab department    > BNP 442 > echo next :   EF < 20% admitted If you do have ulcerative colitis it may be affecting your airways - discuss with your GI doctor       01/06/2022  f/u ov/Stacey Hartman office/Stacey Hartman re: doe maint on stiolto  / followed now in chf clinic Chief Complaint  Patient presents with   Follow-up    Coughing and breathing has improved.  Has had cardiac issues since last visit.   Dyspnea:  still only able to walk about 100 ft flat surfaces slow pace = MMRC3 = can't walk 100 yards even at a slow pace at a flat grade s stopping due to sob   Cough: something   p supper better at hs  Sleeping: about 30 degrees hosp bed  SABA use: has neb not using  02: none  Covid status: vax x 2   Rec Only use your albuterol-ipatropium nebulizer as a rescue medication  Ok to leave off stiolto  F.u pnr   12/10/2022  f/u ov/ office/Stacey Hartman re: *** maint on ***  No chief complaint on file.   Dyspnea:  *** Cough: *** Sleeping: *** SABA use: *** 02: *** Covid status: *** Lung cancer screening: ***   No obvious day to day or daytime variability or assoc excess/ purulent sputum or mucus plugs or hemoptysis or cp or chest tightness, subjective wheeze or overt sinus or hb symptoms.   *** without nocturnal  or early am exacerbation  of respiratory  c/o's or need for noct saba. Also denies any obvious fluctuation of symptoms with weather or environmental changes or other aggravating or alleviating factors except as outlined above   No unusual exposure hx or h/o childhood pna/ asthma or knowledge of premature birth.  Current Allergies, Complete Past Medical History, Past Surgical History, Family History, and Social History were reviewed in [employer] record.  ROS  The following are not active complaints unless bolded Hoarseness, sore throat, dysphagia,  dental problems, itching, sneezing,  nasal congestion or discharge of excess mucus or purulent secretions, ear ache,   fever, chills, sweats, unintended wt loss or wt gain, classically pleuritic or exertional cp,  orthopnea pnd or arm/hand swelling  or leg swelling, presyncope, palpitations, abdominal pain, anorexia, nausea, vomiting, diarrhea  or change in bowel habits or change in bladder habits, change in stools or change in urine, dysuria, hematuria,  rash, arthralgias, visual complaints, headache, numbness, weakness or ataxia or problems with walking or coordination,  change in mood or  memory.        No outpatient medications have been marked as taking for the 12/10/22 encounter  (Appointment) with Stacey Cowden, MD.             Objective:    Wts  12/10/2022       ***   01/06/22 118 lb 6.4 oz (53.7 kg)  12/18/21 119 lb (54 kg)  11/24/21 123 lb 12.8 oz (56.2 kg)   Vital signs reviewed  12/10/2022  - Note at rest 02 sats  ***% on ***   General appearance:    ***    min insp crackles in bases bilaterally S3 gallop / 2/6 ***                      Assessment

## 2022-12-10 ENCOUNTER — Ambulatory Visit: Payer: 59 | Admitting: Internal Medicine

## 2022-12-10 ENCOUNTER — Encounter: Payer: Self-pay | Admitting: Physician Assistant

## 2022-12-10 ENCOUNTER — Ambulatory Visit: Payer: 59 | Attending: Student | Admitting: Physician Assistant

## 2022-12-10 VITALS — BP 96/58 | HR 86 | Ht 63.0 in | Wt 111.0 lb

## 2022-12-10 DIAGNOSIS — I447 Left bundle-branch block, unspecified: Secondary | ICD-10-CM

## 2022-12-10 DIAGNOSIS — I428 Other cardiomyopathies: Secondary | ICD-10-CM

## 2022-12-10 DIAGNOSIS — I5022 Chronic systolic (congestive) heart failure: Secondary | ICD-10-CM

## 2022-12-10 DIAGNOSIS — Z95810 Presence of automatic (implantable) cardiac defibrillator: Secondary | ICD-10-CM | POA: Diagnosis not present

## 2022-12-10 LAB — CUP PACEART INCLINIC DEVICE CHECK
Battery Remaining Longevity: 56 mo
Brady Statistic RA Percent Paced: 9.1 %
Brady Statistic RV Percent Paced: 99.35 %
Date Time Interrogation Session: 20240412184420
HighPow Impedance: 57.375
Implantable Lead Connection Status: 753985
Implantable Lead Connection Status: 753985
Implantable Lead Connection Status: 753985
Implantable Lead Implant Date: 20231023
Implantable Lead Implant Date: 20231023
Implantable Lead Implant Date: 20231023
Implantable Lead Location: 753858
Implantable Lead Location: 753859
Implantable Lead Location: 753860
Implantable Lead Model: 7122
Implantable Pulse Generator Implant Date: 20231023
Lead Channel Impedance Value: 437.5 Ohm
Lead Channel Impedance Value: 475 Ohm
Lead Channel Impedance Value: 887.5 Ohm
Lead Channel Pacing Threshold Amplitude: 0.5 V
Lead Channel Pacing Threshold Amplitude: 0.5 V
Lead Channel Pacing Threshold Amplitude: 0.75 V
Lead Channel Pacing Threshold Amplitude: 2.75 V
Lead Channel Pacing Threshold Amplitude: 2.75 V
Lead Channel Pacing Threshold Pulse Width: 0.5 ms
Lead Channel Pacing Threshold Pulse Width: 0.5 ms
Lead Channel Pacing Threshold Pulse Width: 0.5 ms
Lead Channel Pacing Threshold Pulse Width: 1 ms
Lead Channel Pacing Threshold Pulse Width: 1 ms
Lead Channel Sensing Intrinsic Amplitude: 1 mV
Lead Channel Sensing Intrinsic Amplitude: 12 mV
Lead Channel Setting Pacing Amplitude: 2 V
Lead Channel Setting Pacing Amplitude: 2 V
Lead Channel Setting Pacing Amplitude: 3.5 V
Lead Channel Setting Pacing Pulse Width: 0.5 ms
Lead Channel Setting Pacing Pulse Width: 1 ms
Lead Channel Setting Sensing Sensitivity: 0.5 mV
Pulse Gen Serial Number: 8949490
Zone Setting Status: 755011

## 2022-12-10 NOTE — Patient Instructions (Signed)
Medication Instructions:   Your physician recommends that you continue on your current medications as directed. Please refer to the Current Medication list given to you today.  *If you need a refill on your cardiac medications before your next appointment, please call your pharmacy*   Lab Work:  .NONE ORDERED  TODAY   If you have labs (blood work) drawn today and your tests are completely normal, you will receive your results only by: MyChart Message (if you have MyChart) OR A paper copy in the mail If you have any lab test that is abnormal or we need to change your treatment, we will call you to review the results.   Testing/Procedures:  NONE ORDERED  TODAY     Follow-Up: At [facility], you and your health needs are our priority.  As part of our continuing mission to provide you with exceptional heart care, we have created designated Provider Care Teams.  These Care Teams include your primary Cardiologist (physician) and Advanced Practice Providers (APPs -  Physician Assistants and Nurse Practitioners) who all work together to provide you with the care you need, when you need it.  We recommend signing up for the patient portal called "MyChart".  Sign up information is provided on this After Visit Summary.  MyChart is used to connect with patients for Virtual Visits (Telemedicine).  Patients are able to view lab/test results, encounter notes, upcoming appointments, etc.  Non-urgent messages can be sent to your provider as well.   To learn more about what you can do with MyChart, go to [email or web address].    Your next appointment:   6-8 week(s)  Provider:   Renee Ursuy, PA-C    Other Instructions  

## 2022-12-13 ENCOUNTER — Ambulatory Visit: Payer: 59 | Admitting: Student

## 2022-12-20 ENCOUNTER — Ambulatory Visit (INDEPENDENT_AMBULATORY_CARE_PROVIDER_SITE_OTHER): Payer: 59

## 2022-12-20 DIAGNOSIS — I447 Left bundle-branch block, unspecified: Secondary | ICD-10-CM

## 2022-12-21 LAB — CUP PACEART REMOTE DEVICE CHECK
Battery Remaining Longevity: 60 mo
Battery Remaining Percentage: 89 %
Battery Voltage: 3.08 V
Brady Statistic AP VP Percent: 4.6 %
Brady Statistic AP VS Percent: 1 %
Brady Statistic AS VP Percent: 95 %
Brady Statistic AS VS Percent: 1 %
Brady Statistic RA Percent Paced: 4.6 %
Date Time Interrogation Session: 20240422020015
HighPow Impedance: 60 Ohm
HighPow Impedance: 60 Ohm
Implantable Lead Connection Status: 753985
Implantable Lead Connection Status: 753985
Implantable Lead Connection Status: 753985
Implantable Lead Implant Date: 20231023
Implantable Lead Implant Date: 20231023
Implantable Lead Implant Date: 20231023
Implantable Lead Location: 753858
Implantable Lead Location: 753859
Implantable Lead Location: 753860
Implantable Lead Model: 7122
Implantable Pulse Generator Implant Date: 20231023
Lead Channel Impedance Value: 480 Ohm
Lead Channel Impedance Value: 490 Ohm
Lead Channel Impedance Value: 980 Ohm
Lead Channel Pacing Threshold Amplitude: 0.5 V
Lead Channel Pacing Threshold Amplitude: 0.625 V
Lead Channel Pacing Threshold Amplitude: 2.75 V
Lead Channel Pacing Threshold Pulse Width: 0.5 ms
Lead Channel Pacing Threshold Pulse Width: 0.5 ms
Lead Channel Pacing Threshold Pulse Width: 1 ms
Lead Channel Sensing Intrinsic Amplitude: 12 mV
Lead Channel Sensing Intrinsic Amplitude: 2.6 mV
Lead Channel Setting Pacing Amplitude: 2 V
Lead Channel Setting Pacing Amplitude: 2 V
Lead Channel Setting Pacing Amplitude: 3.5 V
Lead Channel Setting Pacing Pulse Width: 0.5 ms
Lead Channel Setting Pacing Pulse Width: 1 ms
Lead Channel Setting Sensing Sensitivity: 0.5 mV
Pulse Gen Serial Number: 8949490
Zone Setting Status: 755011

## 2023-01-04 ENCOUNTER — Ambulatory Visit: Payer: 59 | Admitting: General Surgery

## 2023-01-06 NOTE — Progress Notes (Signed)
Stacey Hartman, female    DOB: Aug 13, 1956,    MRN: [id number]  Brief patient profile:  6 yowf quit smoking  around 2002 with cough/sob completely improved to point where no symptoms including walking up to several miles including hills but dx fibromyalgia since 2012  then August 2021 salmonella/rx as out pt with "pcn" then severe skin peeling lost 22 lb and persistent diarrhea/sob/ cough referred to pulmonary clinic in [facility]  08/25/2021 by Bryna Colander already under care of skin doctor in [place], GI doctor in [place], rheumatology eval pending.      History of Present Illness  08/25/2021  Pulmonary/ 1st office eval/ Stacey Hartman / [place] [facility]  Chief Complaint  Patient presents with   Consult    Hx of COPD. After penicillin has noticed struggle with breathing since then (03/2020).   Coughs up green mucus    Dyspnea:  getting worse to point of 100 ft doe Cough: congested cough worse when lies down  Sleep: bed is flat with 3 pillows  SABA use: spiriva and albuterol helped / has not tried pred for this illness but took it previously for "bronchitis" and seemed to help Omeprazole bid per GI  Rec Stiolto one puff a day x one week and if not better start on 2 puffs each am  Omeprazole 20 mg Take 30- 60 min before your first and last meals of the day  GERD diet reviewed, bed blocks rec  Please remember to go to the lab department    > BNP 442 > echo next :   EF < 20% admitted If you do have ulcerative colitis it may be affecting your airways - discuss with your GI doctor       01/06/2022  f/u ov/[place] office/Stacey Hartman re: doe maint on stiolto  / followed now in chf clinic Chief Complaint  Patient presents with   Follow-up    Coughing and breathing has improved.  Has had cardiac issues since last visit.   Dyspnea:  still only able to walk about 100 ft flat surfaces slow pace = MMRC3 = can't walk 100 yards even at a slow pace at a flat grade s stopping due to sob   Cough: something  p  supper better at hs  Sleeping: about 30 degrees hosp bed  SABA use: has neb not using  02: none  Covid status: vax x 2   Rec Only use your albuterol-ipatropium nebulizer as a rescue medication  Ok to leave off stiolto    -  PFT's  12/08/21  FEV1 2.27 (97 % ) ratio 0.82  p 0 % improvement from saba p 0  prior to study with DLCO  19.12 (87%)   FV curve min concavity at low volumes   - 01/06/2022   Walked on RA  x  3  lap(s) =  approx 450  ft  @ moderate pace, stopped due to end of study with lowest 02 sats 94% and no reported sob   F/u prn   01/07/2023  f/u ov/[place] office/Stacey Hartman re: doe with nl pfts ? Etiology  maint on no rx.  Chief Complaint  Patient presents with   Follow-up    SOB has improved with pace marker.  Prod cough with tan to green sputum and wheezing.    Dyspnea:  resolved p pacemeker  Cough: assoc with slt discolored nasal discharge  onset p pacemaker 06/21/22  Worse at bedtime and in am assoc with subjective "wheeze"  Sleeping: bed is flat  with 2 pillows - cough no better sitting up  SABA use: none 02: none   Has UC, no follow up with painful knots in abd    No obvious day to day or daytime variability or assoc mucus plugs or hemoptysis or cp or chest tightness,   or overt sinus or hb symptoms.     Also denies any obvious fluctuation of symptoms with weather or environmental changes or other aggravating or alleviating factors except as outlined above   No unusual exposure hx or h/o childhood pna/ asthma or knowledge of premature birth.  Current Allergies, Complete Past Medical History, Past Surgical History, Family History, and Social History were reviewed in [employer] record.  ROS  The following are not active complaints unless bolded Hoarseness, sore throat, dysphagia, dental problems, itching, sneezing,  nasal congestion or discharge of excess mucus or purulent secretions, ear ache,   fever, chills, sweats, unintended wt loss or wt  gain, classically pleuritic or exertional cp,  orthopnea pnd or arm/hand swelling  or leg swelling, presyncope, palpitations, abdominal pain, anorexia, nausea, vomiting, diarrhea  or change in bowel habits/constipated or change in bladder habits, change in stools or change in urine, dysuria, hematuria,  rash, arthralgias, visual complaints, headache, numbness, weakness or ataxia or problems with walking or coordination,  change in mood or  memory.        Current Meds  Medication Sig   atorvastatin (LIPITOR) 40 MG tablet Take 1 tablet (40 mg total) by mouth daily.   carboxymethylcellulose (REFRESH TEARS) 0.5 % SOLN Place 1 drop into both eyes 3 (three) times daily as needed (dry eyes).   clonazePAM (KLONOPIN) 0.5 MG tablet Take 0.5 mg by mouth 2 (two) times daily.   clopidogrel (PLAVIX) 75 MG tablet TAKE ONE TABLET BY MOUTH EVERY DAY   dapagliflozin propanediol (FARXIGA) 10 MG TABS tablet Take 1 tablet (10 mg total) by mouth daily.   esomeprazole (NEXIUM) 20 MG capsule Take 20 mg by mouth daily as needed.   furosemide (LASIX) 20 MG tablet Take 1 tablet (20 mg total) by mouth daily.   gabapentin (NEURONTIN) 300 MG capsule Take 300 mg by mouth at bedtime.   levothyroxine (SYNTHROID) 150 MCG tablet TAKE (1) TABLET BY MOUTH DAILY BEFORE BREAKFAST.   losartan (COZAAR) 25 MG tablet Take 0.5 tablets (12.5 mg total) by mouth at bedtime.   metoprolol succinate (TOPROL-XL) 25 MG 24 hr tablet Take 1 tablet (25 mg total) by mouth at bedtime. Take with or immediately following a meal.   spironolactone (ALDACTONE) 25 MG tablet Take 1 tablet (25 mg total) by mouth daily.             Objective:    Wts  01/07/2023       112   01/06/22 118 lb 6.4 oz (53.7 kg)  12/18/21 119 lb (54 kg)  11/24/21 123 lb 12.8 oz (56.2 kg)   Vital signs reviewed  01/07/2023  - Note at rest 02 sats  95% on RA   General appearance:    elderly thin wf nad     HEENT : Oropharynx  clear      Nasal turbinates mod edema   NECK :   without  apparent JVD/ palpable Nodes/TM    LUNGS: no acc muscle use,  Nl contour chest which is clear to A and P bilaterally without cough on insp or exp maneuvers   CV:  RRR  no s3 or murmur or increase in P2, and  no edema   ABD:  soft and nontender    MS:  Nl gait/ ext warm without deformities Or obvious joint restrictions  calf tenderness, cyanosis or clubbing    SKIN: warm and dry without lesions    NEURO:  alert, approp, nl sensorium with  no motor or cerebellar deficits apparent.     CXR PA and Lateral:   01/07/2023 :    I personally reviewed images and impression is as follows:     Mild/mod t kyphosis / mild hyperinflation   Assessment

## 2023-01-07 ENCOUNTER — Ambulatory Visit (HOSPITAL_COMMUNITY)
Admission: RE | Admit: 2023-01-07 | Discharge: 2023-01-07 | Disposition: A | Discharge status: Home / Self Care | Payer: 59 | Source: Ambulatory Visit | Attending: Internal Medicine | Admitting: Internal Medicine

## 2023-01-07 ENCOUNTER — Encounter: Payer: Self-pay | Admitting: Internal Medicine

## 2023-01-07 ENCOUNTER — Ambulatory Visit (INDEPENDENT_AMBULATORY_CARE_PROVIDER_SITE_OTHER): Payer: 59 | Admitting: Internal Medicine

## 2023-01-07 VITALS — BP 107/67 | HR 80 | Temp 98.4°F | Ht 63.0 in | Wt 112.0 lb

## 2023-01-07 DIAGNOSIS — R058 Other specified cough: Secondary | ICD-10-CM | POA: Diagnosis not present

## 2023-01-07 DIAGNOSIS — R0609 Other forms of dyspnea: Secondary | ICD-10-CM | POA: Diagnosis not present

## 2023-01-07 DIAGNOSIS — R0602 Shortness of breath: Secondary | ICD-10-CM | POA: Insufficient documentation

## 2023-01-07 NOTE — Patient Instructions (Addendum)
Please see Dr Myrtie Neither about your ulcerative colitis - it may be the cause of your cough.  For cough mucinex dm 600 - 1200 mg every 12 hours as needed   Go ahead and start esomeprazole 20 mg Take 30- 60 min before your first and last meals of the day   Ask your pharmacist your records about when you last took amoxicillin because we need to be able to given you omnicef which is a distant cousin  My office will be contacting you by phone for referral to ENT in [place] next available   - if you don't hear back from my office within one week please call us back or notify us thru MyChart and we'll address it right away.   Please remember to go to the  x-ray department  for your tests - we will call you with the results when they are available    Pulmonary follow up is as needed

## 2023-01-08 ENCOUNTER — Encounter: Payer: Self-pay | Admitting: Internal Medicine

## 2023-01-08 NOTE — Assessment & Plan Note (Signed)
Onset p pacemaker placed 06/21/22 and multiple drug intolerance in setting of UC - Referred to ENT 01/07/2023 >>>   Her lungs are clear on exam with chronic cough and nasal discharge - would like to rx with omnicef but allergy to pcn included sob/ swelling so will need to ascertain she's had amoxicillin subsequent to that event and be sure she tolerated it before rx for this chronic problem.   The other issue is whether the cough could represent UC involving the airways though this is less likely with clear lungs as UC does not usually affect the upper airway from my review.   Once established with ent then pulmonary f/u can be prn

## 2023-01-08 NOTE — Assessment & Plan Note (Signed)
Onset was August 2021 s/p quit smoking 2003 s residual doe - assoc with chronic diarrhea at wt loss ? Inflammatory bowel dz?  - 08/25/2021   Walked on RA  x  3  lap(s) =  approx 450  ft  @ mod to fast pace, stopped due to end of study, sob p 1st lap with lowest 02 sats 97% - 08/25/2021  After extensive coaching inhaler device,  effectiveness =    75% with respimat > try stiolto 1 qd x one week then 2 qd p that if tolerates/effective   - Labs ordered 08/25/21   :  allergy profile  Eos 0.3  / IgE 46    alpha one AT phenotype  MM  Level 146 - 10/01/2021 echo  1. Left ventricular ejection fraction, by estimation, is <20%. The left ventricle has severely decreased function. The left ventricle demonstrates global hypokinesis. The left ventricular internal cavity size was moderately dilated. Left ventricular  diastolic parameters are consistent with Grade I diastolic dysfunction (impaired relaxation).  2. Right ventricular systolic function is normal. The right ventricular size is normal. There is mildly elevated pulmonary artery systolic pressure. The estimated right ventricular systolic pressure is 45.0 mmHg.  3. Left atrial size was moderately dilated.  4. Right atrial size was moderately dilated.  5. The mitral valve is degenerative. Mild to moderate mitral valve regurgitation. No evidence of mitral stenosis.  6. The aortic valve is normal in structure. Aortic valve regurgitation is not visualized. No aortic stenosis is present.  7. The inferior vena cava is normal in size with greater than 50% respiratory variability, suggesting right atrial pressure of 3 mmHg> referred to cardiology  -  PFT's  12/08/21  FEV1 2.27 (97 % ) ratio 0.82  p 0 % improvement from saba p 0  prior to study with DLCO  19.12 (87%)   FV curve min concavity at low volumes   - 01/06/2022   Walked on RA  x  3  lap(s) =  approx 450  ft  @ moderate pace, stopped due to end of study with lowest 02 sats 94% and no reported sob   Resolved p  pacemaker placement. No further pulmonary f/u needed.         Each maintenance medication was reviewed in detail including emphasizing most importantly the difference between maintenance and prns and under what circumstances the prns are to be triggered using an action plan format where appropriate.  Total time for H and P, chart review, counseling, and generating customized AVS unique to this office visit / same day charting > 30 min for   refractory respiratory  symptoms of uncertain etiology

## 2023-01-19 ENCOUNTER — Other Ambulatory Visit (HOSPITAL_COMMUNITY): Payer: Self-pay

## 2023-01-19 ENCOUNTER — Ambulatory Visit (HOSPITAL_COMMUNITY)
Admission: RE | Admit: 2023-01-19 | Discharge: 2023-01-19 | Disposition: A | Discharge status: Home / Self Care | Payer: 59 | Source: Ambulatory Visit | Attending: Cardiology | Admitting: Cardiology

## 2023-01-19 ENCOUNTER — Encounter (HOSPITAL_COMMUNITY): Payer: Self-pay | Admitting: Cardiology

## 2023-01-19 VITALS — BP 104/60 | HR 66 | Wt 109.8 lb

## 2023-01-19 DIAGNOSIS — I447 Left bundle-branch block, unspecified: Secondary | ICD-10-CM | POA: Insufficient documentation

## 2023-01-19 DIAGNOSIS — Z8673 Personal history of transient ischemic attack (TIA), and cerebral infarction without residual deficits: Secondary | ICD-10-CM | POA: Insufficient documentation

## 2023-01-19 DIAGNOSIS — R0602 Shortness of breath: Secondary | ICD-10-CM | POA: Insufficient documentation

## 2023-01-19 DIAGNOSIS — E039 Hypothyroidism, unspecified: Secondary | ICD-10-CM | POA: Insufficient documentation

## 2023-01-19 DIAGNOSIS — Z7984 Long term (current) use of oral hypoglycemic drugs: Secondary | ICD-10-CM | POA: Insufficient documentation

## 2023-01-19 DIAGNOSIS — Z8249 Family history of ischemic heart disease and other diseases of the circulatory system: Secondary | ICD-10-CM | POA: Insufficient documentation

## 2023-01-19 DIAGNOSIS — J439 Emphysema, unspecified: Secondary | ICD-10-CM | POA: Diagnosis not present

## 2023-01-19 DIAGNOSIS — Z79899 Other long term (current) drug therapy: Secondary | ICD-10-CM | POA: Diagnosis not present

## 2023-01-19 DIAGNOSIS — I428 Other cardiomyopathies: Secondary | ICD-10-CM | POA: Diagnosis not present

## 2023-01-19 DIAGNOSIS — R42 Dizziness and giddiness: Secondary | ICD-10-CM | POA: Diagnosis not present

## 2023-01-19 DIAGNOSIS — Z7902 Long term (current) use of antithrombotics/antiplatelets: Secondary | ICD-10-CM | POA: Insufficient documentation

## 2023-01-19 DIAGNOSIS — C966 Unifocal Langerhans-cell histiocytosis: Secondary | ICD-10-CM | POA: Insufficient documentation

## 2023-01-19 DIAGNOSIS — I5022 Chronic systolic (congestive) heart failure: Secondary | ICD-10-CM | POA: Diagnosis present

## 2023-01-19 DIAGNOSIS — Z87891 Personal history of nicotine dependence: Secondary | ICD-10-CM | POA: Diagnosis not present

## 2023-01-19 DIAGNOSIS — I959 Hypotension, unspecified: Secondary | ICD-10-CM | POA: Insufficient documentation

## 2023-01-19 LAB — BASIC METABOLIC PANEL
Anion gap: 11 (ref 5–15)
BUN: 17 mg/dL (ref 8–23)
CO2: 28 mmol/L (ref 22–32)
Calcium: 9.3 mg/dL (ref 8.9–10.3)
Chloride: 100 mmol/L (ref 98–111)
Creatinine, Ser: 0.87 mg/dL (ref 0.44–1.00)
GFR, Estimated: >60 mL/min (ref >60)
Glucose, Bld: 104 mg/dL — ABNORMAL HIGH (ref 70–99)
Potassium: 3.8 mmol/L (ref 3.5–5.1)
Sodium: 139 mmol/L (ref 135–145)

## 2023-01-19 LAB — BRAIN NATRIURETIC PEPTIDE: B Natriuretic Peptide: 73.2 pg/mL (ref 0.0–100.0)

## 2023-01-19 MED ORDER — ENTRESTO 24-26 MG PO TABS: 1.0000 | ORAL_TABLET | Freq: Two times a day (BID) | ORAL | 3 refills | Status: DC

## 2023-01-19 NOTE — Patient Instructions (Signed)
STOP Losartan  STOP Lasix.  START  Entresto 24/26 mg Twice daily  Labs done today, your results will be available in MyChart, we will contact you for abnormal readings.  Repeat blood work in 10 days.  Expect to be in the lab for 2 hours. Please plan to arrive 30 minutes prior to your appointment. You may be asked to reschedule your test if you arrive 20 minutes or more after your scheduled appointment time.  Main Campus address: [address] [place], [place] [address] You may arrive to the Main Entrance A or Entrance C (free valet parking is available at both). -Main Entrance A (on [address]) :proceed to admitting for check in -Entrance C (on [facility]): proceed to [employer] parking or under hospital deck parking using this code _________  Check In: [facility] waiting room (1st floor)   General Instructions for the day of the test (Please follow all instructions from your physician): Refrain from ingesting a heavy meal, alcohol, or caffeine or using tobacco products within 2 hours of the test (DO NOT FAST for mare than 8 hours). You may have all other non-alcoholic, non -caffeinated beverage,a light snack (crackers,a piece of fruit, carrot sticks, toast bagel,etc) up to your appointment. Avoid significant exertion or exercise within 24 hours of your test. Be prepared to exercise and sweat. Your clothing should permit freedom of movement and include walking or running shoes. Women bring loose fitting short sleeved blouse.  This evaluation may be fatiguing and you may wish ti have someone accompany you to the assessment to drive you home afterward. Bring a list of your medications with you, including dosage and frequency you take the medications (  I.e.,once per day, twice per day, etc). Take all medications as prescribed, unless noted below or instructed to do so by your physician.  Please do not take the following medications prior to your  CPX:  _________________________________________________  _________________________________________________  Brief description of the test: A brief lung test will be performed. This will involve you taking deep breaths and blowing hard and fast through your mouth. During these , a clip will be on your nose and you will be breathing through a breathing device.   For the exercise portion of the test you will be walking on a treadmill, or riding a stationary bike, to your maximal effor or until symptoms such as chest pain, shortness of breath, leg pain or dizziness limit your exercise. You will be breathing in and out of a breathing device through your mouth (a clip will be on your nose again). Your heart rate, ECG, blood pressure, oxygen saturations, breathing rate and depth, amount of oxygen you consume and amount of carbon dioxide you produce will be measured and monitored throughout the exercise test.  If you need to cancel or reschedule your appointment please call [phone] If you have further questions please call your physician or Philip Aspen, MS, ACSM-RCEP at [phone]   Your physician has requested that you have an echocardiogram. Echocardiography is a painless test that uses sound waves to create images of your heart. It provides your doctor with information about the size and shape of your heart and how well your heart's chambers and valves are working. This procedure takes approximately one hour. There are no restrictions for this procedure. Please do NOT wear cologne, perfume, aftershave, or lotions (deodorant is allowed). Please arrive 15 minutes prior to your appointment time.  Your physician recommends that you schedule a follow-up appointment  in: 3 months  If you have any questions or concerns before your next appointment please send [place] a message through [place] or call our office at [phone].    TO LEAVE A MESSAGE FOR THE NURSE SELECT OPTION 2, PLEASE LEAVE A MESSAGE  INCLUDING: YOUR NAME DATE OF BIRTH CALL BACK NUMBER REASON FOR CALL**this is important as we prioritize the call backs  YOU WILL RECEIVE A CALL BACK THE SAME DAY AS LONG AS YOU CALL BEFORE 4:00 PM  At the [facility], you and your health needs are our priority. As part of our continuing mission to provide you with exceptional heart care, we have created designated Provider Care Teams. These Care Teams include your primary Cardiologist (physician) and Advanced Practice Providers (APPs- Physician Assistants and Nurse Practitioners) who all work together to provide you with the care you need, when you need it.   You may see any of the following providers on your designated Care Team at your next follow up: Dr Arvilla Meres Dr Marca Ancona Dr. Marcos Eke, NP Robbie Lis, [place] [facility] [place], [place] Brynda Peon, NP Karle Plumber, PharmD   Please be sure to bring in all your medications bottles to every appointment.    Thank you for choosing [facility]

## 2023-01-20 ENCOUNTER — Other Ambulatory Visit (HOSPITAL_COMMUNITY): Payer: Self-pay

## 2023-01-20 DIAGNOSIS — I5022 Chronic systolic (congestive) heart failure: Secondary | ICD-10-CM

## 2023-01-20 NOTE — Progress Notes (Deleted)
Cardiology Office Note Date:  12/06/2022  Patient ID:  Michaya, Khalsa May 22, 1956, MRN [id number] PCP:  Smith Robert, MD (Inactive)  Cardiologist:  Dr. Shirlee Latch Electrophysiologist: Dr. Graciela Husbands    Chief Complaint:   *** consider optimization  History of Present Illness: JAYLANIE LAIRD is a 67 y.o. female with history of COPD/emphysema (former smoker), chronic CHF (systolic), hypothyroidism, LBBB, stroke, Raynaud's, stroke  --Referred to AHF team for DOE, Echo 2/23 EF < 20% RV normal.   --Sent to ED from cardiology office after echo showed EF < 20%. With c/o CP and SOB in waiting room/triage and taken for urgent R/LHC.  --Cardiac cath with no significant CAD, normal PA and PCWP, LVEDP 28 mmHg, CO preserved.   --cMRI with LVEF 12%, no myocardial LGE, elevated T1 indices, ECV 42% in basal septum.  Developed acute left sided facial numbness during cMRI. Code stroke called.  --MRI brain with punctate acute infarct in high right frontal cortex.  No AF detected during admit.  --30-day monitor arranged to evaluate for arrhythmias, showed no AF either.  --PYP obtained and equivocal for transthyretin amyloidosis (grade 1, H/CL 1.47).   TTR gene testing negative  Echo 5/23, EF remained < 20%, moderate LV dilation, normal RV, IVC normal  6/23 with Dr. Graciela Husbands to discuss CRT-D. Concern for on-going weight loss and CT chest A/P ordered to rule out malignancy before proceeding with CRT. This showed rectal wall thickening, concerning for malignancy, however no other findings to explain weight loss.  She saw GI, noted to have an unremarkable c-scope about a year ago.  GI thinks unlikely malignancy but has arranged for sigmoidoscopy.   She saw Dr. Shirlee Latch 05/10/22, a little more SOB, winded with stairs, long walks, + orthonea is chronic Meds adjusted, did not think BP would tolerate Entresto  05/24/22 flex sigmoidoscopy The entire examined colon is normal. - No specimens collected. Normal rectum. CT  artifact. No source seen for weight loss, which may be due to the patient's advanced heart failure.  She saw L. Dorothyann Gibbs, PA-C 06/07/22, discussed progressive DOE, slight worsening exertional capacity, not acutely volume OL, no med changes made.  ICD implanted 06/21/22  She saw HF team 08/06/22, device with stable volume status, BP 99%, some ongoing DOE< discussed cards vs pulm etiology and planned for CPX  She had a televisit with Dr. Graciela Husbands 09/21/22, pt reported improved functional status, reporyted LLE swelling/erythema, both were improved but not resolved.  Planned for doppler and EP APP visit for device programming  Venous doppler was negative for DVT  She saw S. Riddle, NP 11/22/22, reported some increased SOB, sleeping with 2 pillows. No other symptoms, and by exam not felt to be overtly volume OL, was due to see the HF team, no changes were made to her meds. Noted some infrequent FF on the A channel with false AMS, otherwise felt to have stable device findings. Acute implant lead outputs reduced to clinic standard/safety margins  In further review after the patient left, LV threshold test result unclear and called to come back in to recheck LV threshold, revisit volume status, OptiVol. She was scheduled and canceled/rescheduled to revisit her LV lead a number of times.  I saw her 12/10/22 Overall remains generally fatigued, still get winded with exertion, but all of this is improved post CRT implant.  Just not as good as she would like. He real primary concern is that despite a good appetite and eating well without any real restrictions  in food choices she continues to lose weight. Her PMD has retired, though did get a call from the office that her TSH done a few months prior was very high, but a recheck last week was wnl. No CP, palpitations or cardiac awareness No rest SOB No near syncope or syncope. LV outputs adjusted AMS all reviewed, were false for FF Post programming EKG with  LBBB, planned to have her back reassess LV capture and optimize if able  She saw Dr. Shirlee Latch 01/19/23, feeling improved post CRT, improved exertional capacity, losartan > entresto.  Planned for CPX to better identify if functional limitation is cardiac/pulmonary, likely both.  *** optimize *** volume *** BP% *** symptoms   Device information [employer] CRT-D implanted 06/21/22 LV lead in in [place]  Past Medical History:  Diagnosis Date   Anxiety    Chronic systolic CHF (congestive heart failure), NYHA class 3 (HCC)    Fibromyalgia    Hypothyroid    LBBB (left bundle branch block)    NICM (nonischemic cardiomyopathy) (HCC)    Thyroid disease    Weight loss, non-intentional     Past Surgical History:  Procedure Laterality Date   ANKLE SURGERY Right    BIV ICD INSERTION CRT-D N/A 06/21/2022   Procedure: BIV ICD INSERTION CRT-D;  Surgeon: Duke Salvia, MD;  Location: [facility] INVASIVE CV LAB;  Service: Cardiovascular;  Laterality: N/A;   CHOLECYSTECTOMY     FLEXIBLE SIGMOIDOSCOPY N/A 05/24/2022   Procedure: FLEXIBLE SIGMOIDOSCOPY;  Surgeon: Sherrilyn Rist, MD;  Location: [facility] ENDOSCOPY;  Service: Gastroenterology;  Laterality: N/A;  NO SEDATION   RIGHT/LEFT HEART CATH AND CORONARY ANGIOGRAPHY N/A 10/05/2021   Procedure: RIGHT/LEFT HEART CATH AND CORONARY ANGIOGRAPHY;  Surgeon: Laurey Morale, MD;  Location: [facility] INVASIVE CV LAB;  Service: Cardiovascular;  Laterality: N/A;   TUBAL LIGATION      Current Outpatient Medications  Medication Sig Dispense Refill   atorvastatin (LIPITOR) 40 MG tablet Take 1 tablet (40 mg total) by mouth daily. 90 tablet 3   carboxymethylcellulose (REFRESH TEARS) 0.5 % SOLN Place 1 drop into both eyes 3 (three) times daily as needed (dry eyes).     clonazePAM (KLONOPIN) 0.5 MG tablet Take 0.5 mg by mouth 2 (two) times daily.     clopidogrel (PLAVIX) 75 MG tablet TAKE ONE TABLET BY MOUTH EVERY DAY 30 tablet 6   dapagliflozin propanediol (FARXIGA) 10 MG TABS tablet  Take 1 tablet (10 mg total) by mouth daily. 90 tablet 3   esomeprazole (NEXIUM) 20 MG capsule Take 20 mg by mouth daily as needed.     furosemide (LASIX) 20 MG tablet Take 1 tablet (20 mg total) by mouth daily. 90 tablet 3   gabapentin (NEURONTIN) 300 MG capsule Take 300 mg by mouth at bedtime.     levothyroxine (SYNTHROID) 150 MCG tablet TAKE (1) TABLET BY MOUTH DAILY BEFORE BREAKFAST. 30 tablet 11   losartan (COZAAR) 25 MG tablet Take 0.5 tablets (12.5 mg total) by mouth at bedtime. 15 tablet 5   metoprolol succinate (TOPROL-XL) 25 MG 24 hr tablet Take 1 tablet (25 mg total) by mouth at bedtime. Take with or immediately following a meal. 90 tablet 3   mirtazapine (REMERON) 7.5 MG tablet Take 7.5 mg by mouth at bedtime.     spironolactone (ALDACTONE) 25 MG tablet Take 1 tablet (25 mg total) by mouth daily. 90 tablet 3   No current facility-administered medications for this visit.    Allergies:   Penicillins  and Sulfa antibiotics   Social History:  The patient  reports that she has quit smoking. Her smoking use included cigarettes. She has been exposed to tobacco smoke. She has never used smokeless tobacco. She reports that she does not currently use alcohol. She reports that she does not use drugs.   Family History:  The patient's family history includes Colon cancer in her maternal grandmother; Healthy in her son; Heart attack in her father; Heart disease in her father; Heart failure in her brother; Melanoma in her brother, mother, and sister; Stomach cancer in her maternal uncle.  ROS:  Please see the history of present illness.    All other systems are reviewed and otherwise negative.   PHYSICAL EXAM:  VS:  LMP  (LMP Unknown)  BMI: There is no height or weight on file to calculate BMI. Well nourished, well developed, in no acute distress HEENT: normocephalic, atraumatic Neck: no JVD, carotid bruits or masses Cardiac: *** RRR; no significant murmurs, no rubs, or gallops Lungs: *** CTA  b/l, no wheezing, rhonchi or rales Abd: soft, nontender MS: no deformity, marked, advanced atrophy Ext: *** no edema Skin: warm and dry, no rash Neuro:  No gross deficits appreciated Psych: euthymic mood, full affect  *** ICD site: no erythema, edema, tenderness or tethering, no skin thinning  EKG:  done today and reviewed by myself (post programming) ***  Device interrogation done today and reviewed by myself ***   01/26/22: TTE 1. Left ventricular ejection fraction, by estimation, is <20%. The left  ventricle has severely decreased function. The left ventricle demonstrates  global hypokinesis. The left ventricular internal cavity size was  moderately dilated. Left ventricular  diastolic parameters are consistent with Grade I diastolic dysfunction  (impaired relaxation). The average left ventricular global longitudinal  strain is -10.2 %. The global longitudinal strain is abnormal.   2. Right ventricular systolic function is normal. The right ventricular  size is normal. Tricuspid regurgitation signal is inadequate for assessing  PA pressure.   3. Left atrial size was mildly dilated.   4. A small pericardial effusion is present.   5. The mitral valve is normal in structure. Trivial mitral valve  regurgitation. No evidence of mitral stenosis.   6. The aortic valve is tricuspid. Aortic valve regurgitation is not  visualized. No aortic stenosis is present.   7. The inferior vena cava is normal in size with greater than 50%  respiratory variability, suggesting right atrial pressure of 3 mmHg.    10/05/21: R/LHC 1. Normal RA pressure and PCWP though LVEDP was elevated.  PCWP was repeated and remained low.  2. Preserved cardiac output.  3. Normal PA pressure.  4. No significant coronary disease.   Recent Labs: 06/07/2022: B Natriuretic Peptide 442.0 06/14/2022: Hemoglobin 15.3; Platelets 212 08/06/2022: BUN 12; Creatinine, Ser 0.71; Potassium 4.3; Sodium 140  01/26/2022:  Cholesterol 114; HDL 47; LDL Cholesterol 48; Total CHOL/HDL Ratio 2.4; Triglycerides 93; VLDL 19   CrCl cannot be calculated (Patient's most recent lab result is older than the maximum 21 days allowed.).   Wt Readings from Last 3 Encounters:  11/22/22 111 lb 9.6 oz (50.6 kg)  08/06/22 113 lb 12.8 oz (51.6 kg)  06/21/22 113 lb (51.3 kg)     Other studies reviewed: Additional studies/records reviewed today include: summarized above  ASSESSMENT AND PLAN:  NICM LBBB Chronic CHF (systolic) *** She does not appear volume OL *** CorVue appears to be making a turn up *** Continue meds  4. ICD *** ***  5. Unintentional weight loss *** I have urged her to re-visit with GI, discuss further given she is waiting on a new PMD via the old's clinic    Disposition: ***  Current medicines are reviewed at length with the patient today.  The patient did not have any concerns regarding medicines.  Norma Fredrickson, PA-C 12/06/2022 1:36 PM     [facility] [address] [place] [address] [phone] (office)  [phone] (fax)

## 2023-01-20 NOTE — Progress Notes (Signed)
ADVANCED HF CLINIC NOTE   Primary Care: Smith Robert, MD HF Cardiologist: Dr. Shirlee Latch  HPI: [name] is a 67 y.o. with a history of hypothyroidism, former tobacco abuse (quit 20 years ago), COPD, and systolic heart failure with severely reduced EF.    Father with "MI," nephew died with heart failure at 65 yrs old.    Echo 2/23 EF < 20% RV normal.  Sent to ED from cardiology office after echo completed.  Had CP and SOB in waiting room/triage and taken for urgent R/LHC. Cardiac cath with no significant CAD, normal PA and PCWP, LVEDP 28 mmHg, CO preserved.  cMRI with LVEF 12%, no myocardial LGE, elevated T1 indices, ECV 42% in basal septum. Developed acute left sided facial numbness during cMRI. Code stroke called. MRI brain with punctate acute infarct in high right frontal cortex. No AF detected during admit. 30-day monitor showed no AF either. PYP equivocal for transthyretin amyloidosis (grade 1, H/CL 1.47). She was discharged home, weight 122 lbs.  Echo 5/23, EF remained < 20%, moderate LV dilation, normal RV, IVC normal.  Referred to EP for CRT-D.  Follow up 6/23 with Dr. Graciela Husbands to discuss CRT-D. Concern for on-going weight loss and CT chest A/P ordered to rule out malignancy before proceeding with CRT. This showed rectal wall thickening, concerning for malignancy, however no other findings to explain weight loss.  She saw GI, noted to have an unremarkable c-scope about a year ago.  Had sigmoidoscopy 09/23 with no evidence of malignancy.  S/p St. Jude CRT-D 10/23.  Today she returns for HF follow up. Weight stable.  Says she does feel better s/p CRT, able to walk farther.  She is short of breath after walking 100-200 yards.  Rare orthopnea.  No chest pain. Mild lightheadedness occasionally if stands too fast.    Labs (2/23): K 3.9, creatinine 0.63, hgb 12.3 Labs (4/23): K 4.2, creatinine 0.75 Labs (6/23): K 4.0, creatinine 0.76, LDL 48, HDL 47 Labs (9/23): K 4.2, creatinine 0.62, BNP  366 Labs (10/23): K 4.5, creatinine 0.93 Labs (12/23): K 4.3, creatinine 0.71  PMH: 1. Hypothyroidism 2. 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 Fibromyalgia 4. Raynaud's phenomenon 5. Chronic systolic CHF: Nonischemic cardiomyopathy. Negative Inv[employer]ae gene testing.  - Echo (2/23): EF < 20%, RV normal.  - LHC/RHC (2/23): mean RA 2, PA 20/12 mean 13, mean PCWP 7, CI 2.2 Fick/2.79 thermo; no significant CAD.  - Cardiac MRI (2/23): LV EF 12%. No myocardial LGE. Elevated T1 indices, ECV was 42% in the basal septum.  - PYP (2/23): Grade 1, H/CL 1.47 (equivocal); TTR gene testing negative.  - Echo (5/23): EF < 20%, moderate LV dilation, normal RV, IVC normal.  - s/p St. Jude CRT-D (10/23). 6. CVA: 2/23.  - Zio monitor x 1 month showed no AF.  7. HCV: Treated.   Current Outpatient Medications  Medication Sig Dispense Refill   atorvastatin (LIPITOR) 40 MG tablet Take 1 tablet (40 mg total) by mouth daily. 90 tablet 3   carboxymethylcellulose (REFRESH TEARS) 0.5 % SOLN Place 1 drop into both eyes 3 (three) times daily as needed (dry eyes).     clonazePAM (KLONOPIN) 0.5 MG tablet Take 0.5 mg by mouth 2 (two) times daily.     clopidogrel (PLAVIX) 75 MG tablet TAKE ONE TABLET BY MOUTH EVERY DAY 30 tablet 6   dapagliflozin propanediol (FARXIGA) 10 MG TABS tablet Take 1 tablet (10  mg total) by mouth daily. 90 tablet 3   esomeprazole (NEXIUM) 20 MG capsule Take 20 mg by mouth daily as needed.     gabapentin (NEURONTIN) 300 MG capsule Take 300 mg by mouth at bedtime.     levothyroxine (SYNTHROID) 150 MCG tablet TAKE (1) TABLET BY MOUTH DAILY BEFORE BREAKFAST. 30 tablet 11   metoprolol succinate (TOPROL-XL) 25 MG 24 hr tablet Take 1 tablet (25 mg total) by mouth at bedtime. Take with or immediately following a meal. 90 tablet 3   sacubitril-valsartan (ENTRESTO) 24-26 MG Take 1 tablet by mouth 2 (two) times daily. 180 tablet 3   spironolactone  (ALDACTONE) 25 MG tablet Take 1 tablet (25 mg total) by mouth daily. 90 tablet 3   No current facility-administered medications for this encounter.   Allergies  Allergen Reactions   Penicillins Shortness Of Breath    Trouble breathing    Sulfa Antibiotics Other (See Comments)    Causes Thrush    Social History   Socioeconomic History   Marital status: Unknown    Spouse name: Not on file   Number of children: 2   Years of education: Not on file   Highest education level: Not on file  Occupational History   Occupation: disabled  Tobacco Use   Smoking status: Former    Packs/day: 0.50    Years: 25.00    Additional pack years: 0.00    Total pack years: 12.50    Types: Cigarettes    Quit date: 2002    Years since quitting: 22.4    Passive exposure: Current   Smokeless tobacco: Never  Vaping Use   Vaping Use: Never used  Substance and Sexual Activity   Alcohol use: Not Currently   Drug use: Never   Sexual activity: Not on file  Other Topics Concern   Not on file  Social History Narrative   Not on file   Social Determinants of Health   Financial Resource Strain: Not on file  Food Insecurity: Not on file  Transportation Needs: Not on file  Physical Activity: Not on file  Stress: Not on file  Social Connections: Not on file  Intimate Partner Violence: Not on file   Family History  Problem Relation Age of Onset   Melanoma Mother    Heart attack Father    Heart disease Father    Melanoma Sister    Melanoma Brother    Heart failure Brother    Colon cancer Maternal Grandmother    Healthy Son    Stomach cancer Maternal Uncle    Esophageal cancer Neg Hx    Rectal cancer Neg Hx    BP 104/60   Pulse 66   Wt 49.8 kg (109 lb 12.8 oz)   LMP  (LMP Unknown)   SpO2 94%   BMI 19.45 kg/m   Wt Readings from Last 3 Encounters:  01/19/23 49.8 kg (109 lb 12.8 oz)  01/07/23 50.8 kg (112 lb)  12/10/22 50.3 kg (111 lb)   General: NAD Neck: No JVD, no thyromegaly or  thyroid nodule.  Lungs: Clear to auscultation bilaterally with normal respiratory effort. CV: Nondisplaced PMI.  Heart regular S1/S2, no S3/S4, no murmur.  No peripheral edema.  No carotid bruit.  Normal pedal pulses.  Abdomen: Soft, nontender, no hepatosplenomegaly, no distention.  Skin: Intact without lesions or rashes.  Neurologic: Alert and oriented x 3.  Psych: Normal affect. Extremities: No clubbing or cyanosis.  HEENT: Normal.   ASSESSMENT & PLAN: 1.  Chronic systolic CHF/NICM/LBBB: Etiology not certain. No CAD on LHC. 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 She has a strong family history of CHF (father died at 19 with "MI", brother with "CHF," nephew with "CHF" died at 25). Possible familial cardiomyopathy but Invitae gene testing was negative.  Echo 2/23 showed EF < 20% with diffuse hypokinesis and normal RV.  No LV thrombus noted. R/LHC in 2/23 showed no significant coronary disease, normal RA pressure and PCWP, LVEDP 28 mmHg, preserved CO. cMRI (2/23) showed no myocardial LGE, ECV 42% in the basal septum. PYP not suggestive of TTR amyloidosis (at most equivocal) and genetic testing for TTR mutation was negative. Echo 5/23 EF remains < 20% with relatively normal RV.  Now s/p St Jude CRT-D.  NYHA II symptoms. GDMT has been limited by low BP.  - Continue Toprol XL 25 mg qhs. - Continue spiro 25 mg daily. - Stop losartan, start Entresto 24/26 bid and stop Lasix.  Can go back to losartan if she has symptomatic orthostasis.  BMET/BNP today, BMET 10 days.   - Continue Farxiga 10 mg daily.  - ? how much of her functional limitation is due to heart failure vs pulmonary disease, suspect combination of both. Arrange for CPX. - Needs echo at followup in 3 months.  2. CVA: Code Stroke during cMRI. Head CT negative, MRI brain + punctate acute infarct in the high right frontal cortex. CTA Head and Neck no LVO. Suspect infarct due to probable cardio embolic and small vessel disease. No Afib  detected during admit. 30 day monitor in 3/23 showed no Afib. - Continue Plavix, no ASA.  - Continue atorvastatin 3. 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  However, PFTs in 4/23 showed only minimal obstruction.  CT chest (7/23) showed emphysema as well as several cysts and nodules raising suspicion for Langerhans cell histiocytosis. She has seen Dr. Sherene Sires for pulmonary, not thought to have major lung pathology.  4. Weight loss:  CT chest, A/P showed rectal wall thickening, concerning for malignancy. Patient tells me she had had full work up by GI w/ colonoscopy last year and had 2 polyps removed.  No malignancy on sigmoidoscopy 9/23.   Keep follow up in 3 months with echo.   Stacey Hartman  01/20/23

## 2023-01-21 ENCOUNTER — Encounter: Payer: Self-pay | Admitting: Physician Assistant

## 2023-01-21 ENCOUNTER — Ambulatory Visit: Payer: 59 | Attending: Physician Assistant | Admitting: Physician Assistant

## 2023-01-22 ENCOUNTER — Other Ambulatory Visit (HOSPITAL_COMMUNITY): Payer: Self-pay | Admitting: Cardiology

## 2023-01-23 NOTE — Progress Notes (Deleted)
Stacey Hartman, female    DOB: [birth year],    MRN: [id number]  Brief patient profile:  62 yowf quit smoking  around 2002 with cough/sob completely improved to point where no symptoms including walking up to several miles including hills but dx fibromyalgia since 2012  then August 2021 salmonella/rx as out pt with "pcn" then severe skin peeling lost 22 lb and persistent diarrhea/sob/ cough referred to pulmonary clinic in [facility]  08/25/2021 by Bryna Colander already under care of skin doctor in [place], GI doctor in [place], rheumatology eval pending.    History of Present Illness  08/25/2021  Pulmonary/ 1st office eval/ Stacey Hartman / [place] [facility]  Chief Complaint  Patient presents with   Consult    Hx of COPD. After penicillin has noticed struggle with breathing since then (03/2020).   Coughs up green mucus    Dyspnea:  getting worse to point of 100 ft doe Cough: congested cough worse when lies down  Sleep: bed is flat with 3 pillows  SABA use: spiriva and albuterol helped / has not tried pred for this illness but took it previously for "bronchitis" and seemed to help Omeprazole bid per GI  Rec Stiolto one puff a day x one week and if not better start on 2 puffs each am  Omeprazole 20 mg Take 30- 60 min before your first and last meals of the day  GERD diet reviewed, bed blocks rec  Please remember to go to the lab department    > BNP 442 > echo next :   EF < 20% admitted If you do have ulcerative colitis it may be affecting your airways - discuss with your GI doctor   - PFT's  12/08/21  FEV1 2.27 (97 % ) ratio 0.82  p 0 % improvement from saba p 0  prior to study with DLCO  19.12 (87%)   FV curve min concavity at low volumes   - 01/06/2022   Walked on RA  x  3  lap(s) =  approx 450  ft  @ moderate pace, stopped due to end of study with lowest 02 sats 94% and no reported sob     01/07/2023  f/u ov/ office/Stacey Hartman re: doe with nl pfts ? Etiology  maint on no rx.  Chief Complaint   Patient presents with   Follow-up    SOB has improved with pace marker.  Prod cough with tan to green sputum and wheezing.    Dyspnea:  resolved p pacemeker  Cough: assoc with slt discolored nasal discharge  onset p pacemaker 06/21/22  Worse at bedtime and in am assoc with subjective "wheeze"  Sleeping: bed is flat with 2 pillows - cough no better sitting up  SABA use: none 02: none Rec Please see Dr Myrtie Neither about your ulcerative colitis - it may be the cause of your cough. For cough mucinex dm 600 - 1200 mg every 12 hours as needed  Go ahead and start esomeprazole 20 mg Take 30- 60 min before your first and last meals of the day  Ask your pharmacist your records about when you last took amoxicillin because we need to be able to given you omnicef which is a distant cousin My office will be contacting you by phone for referral to ENT in [place] next available  ***  Pulmonary follow up is as needed    01/25/2023  f/u ov/Stacey Hartman re: UC/ doe  maint on ***  No chief complaint on file.   Dyspnea:  ***  Cough: *** Sleeping: *** SABA use: *** 02: *** Covid status:   *** Lung cancer screening :  ***    No obvious day to day or daytime variability or assoc excess/ purulent sputum or mucus plugs or hemoptysis or cp or chest tightness, subjective wheeze or overt sinus or hb symptoms.   *** without nocturnal  or early am exacerbation  of respiratory  c/o's or need for noct saba. Also denies any obvious fluctuation of symptoms with weather or environmental changes or other aggravating or alleviating factors except as outlined above   No unusual exposure hx or h/o childhood pna/ asthma or knowledge of premature birth.  Current Allergies, Complete Past Medical History, Past Surgical History, Family History, and Social History were reviewed in [employer] record.  ROS  The following are not active complaints unless bolded Hoarseness, sore throat, dysphagia, dental problems,  itching, sneezing,  nasal congestion or discharge of excess mucus or purulent secretions, ear ache,   fever, chills, sweats, unintended wt loss or wt gain, classically pleuritic or exertional cp,  orthopnea pnd or arm/hand swelling  or leg swelling, presyncope, palpitations, abdominal pain, anorexia, nausea, vomiting, diarrhea  or change in bowel habits or change in bladder habits, change in stools or change in urine, dysuria, hematuria,  rash, arthralgias, visual complaints, headache, numbness, weakness or ataxia or problems with walking or coordination,  change in mood or  memory.        No outpatient medications have been marked as taking for the 01/25/23 encounter (Appointment) with Nyoka Cowden, MD.         Objective:    Wts  01/25/2023        *** 01/07/2023       112   01/06/22 118 lb 6.4 oz (53.7 kg)  12/18/21 119 lb (54 kg)  11/24/21 123 lb 12.8 oz (56.2 kg)   Vital signs reviewed  01/25/2023  - Note at rest 02 sats  ***% on ***   General appearance:    ***    Assessment

## 2023-01-25 ENCOUNTER — Ambulatory Visit: Payer: 59 | Admitting: Internal Medicine

## 2023-01-26 NOTE — Progress Notes (Signed)
Remote ICD transmission.   

## 2023-01-27 ENCOUNTER — Ambulatory Visit: Payer: 59 | Admitting: General Surgery

## 2023-02-04 ENCOUNTER — Other Ambulatory Visit (HOSPITAL_COMMUNITY): Payer: 59

## 2023-02-07 ENCOUNTER — Other Ambulatory Visit (HOSPITAL_COMMUNITY): Payer: Self-pay | Admitting: Cardiology

## 2023-02-07 DIAGNOSIS — I5022 Chronic systolic (congestive) heart failure: Secondary | ICD-10-CM

## 2023-02-07 NOTE — Addendum Note (Signed)
Addended by: Theresia Bough on: 02/07/2023 03:25 PM   Modules accepted: Orders

## 2023-02-10 ENCOUNTER — Ambulatory Visit (INDEPENDENT_AMBULATORY_CARE_PROVIDER_SITE_OTHER): Payer: 59 | Admitting: General Surgery

## 2023-02-10 ENCOUNTER — Encounter: Payer: Self-pay | Admitting: General Surgery

## 2023-02-10 VITALS — BP 96/63 | HR 68 | Temp 98.2°F | Resp 12 | Ht 63.0 in | Wt 110.0 lb

## 2023-02-10 DIAGNOSIS — L720 Epidermal cyst: Secondary | ICD-10-CM

## 2023-02-10 NOTE — Progress Notes (Signed)
Stacey Hartman; [id number]; 1956/06/25   HPI Patient is a 67 year old white female who referred herself to my care for evaluation and treatment of a tender subcutaneous nodule over her right hip.  She states she has lost a lot of weight recently and started having a pea-sized subcutaneous nodule over the right hip.  She recently underwent AICD placement for severe cardiomyopathy and left bundle branch block.  She has had a significant weight loss due to her multiple medical problems. Past Medical History:  Diagnosis Date   Anxiety    Chronic systolic CHF (congestive heart failure), NYHA class 3 (HCC)    Fibromyalgia    Hypothyroid    LBBB (left bundle branch block)    NICM (nonischemic cardiomyopathy) (HCC)    Thyroid disease    Weight loss, non-intentional     Past Surgical History:  Procedure Laterality Date   ANKLE SURGERY Right    BIV ICD INSERTION CRT-D N/A 06/21/2022   Procedure: BIV ICD INSERTION CRT-D;  Surgeon: Duke Salvia, MD;  Location[facility] INVASIVE CV LAB;  Service: Cardiovascular;  Laterality: N/A;   CHOLECYSTECTOMY     FLEXIBLE SIGMOIDOSCOPY N/A 05/24/2022   Procedure: FLEXIBLE SIGMOIDOSCOPY;  Surgeon: Sherrilyn Rist, MD;  Location: [facility] ENDOSCOPY;  Service: Gastroenterology;  Laterality: N/A;  NO SEDATION   RIGHT/LEFT HEART CATH AND CORONARY ANGIOGRAPHY N/A 10/05/2021   Procedure: RIGHT/LEFT HEART CATH AND CORONARY ANGIOGRAPHY;  Surgeon: Laurey Morale, MD;  Location: [facility] INVASIVE CV LAB;  Service: Cardiovascular;  Laterality: N/A;   TUBAL LIGATION      Family History  Problem Relation Age of Onset   Melanoma Mother    Heart attack Father    Heart disease Father    Melanoma Sister    Melanoma Brother    Heart failure Brother    Colon cancer Maternal Grandmother    Healthy Son    Stomach cancer Maternal Uncle    Esophageal cancer Neg Hx    Rectal cancer Neg Hx     Current Outpatient Medications on File Prior to Visit  Medication Sig Dispense Refill    atorvastatin (LIPITOR) 40 MG tablet Take 1 tablet (40 mg total) by mouth daily. 90 tablet 3   carboxymethylcellulose (REFRESH TEARS) 0.5 % SOLN Place 1 drop into both eyes 3 (three) times daily as needed (dry eyes).     clonazePAM (KLONOPIN) 0.5 MG tablet Take 0.5 mg by mouth 2 (two) times daily.     clopidogrel (PLAVIX) 75 MG tablet TAKE ONE TABLET BY MOUTH EVERY DAY 30 tablet 6   dapagliflozin propanediol (FARXIGA) 10 MG TABS tablet Take 1 tablet (10 mg total) by mouth daily. 90 tablet 3   esomeprazole (NEXIUM) 20 MG capsule Take 20 mg by mouth daily as needed.     gabapentin (NEURONTIN) 300 MG capsule Take 300 mg by mouth at bedtime.     levothyroxine (SYNTHROID) 150 MCG tablet TAKE (1) TABLET BY MOUTH DAILY BEFORE BREAKFAST. 30 tablet 11   metoprolol succinate (TOPROL-XL) 25 MG 24 hr tablet Take 1 tablet (25 mg total) by mouth at bedtime. Take with or immediately following a meal. 90 tablet 3   sacubitril-valsartan (ENTRESTO) 24-26 MG Take 1 tablet by mouth 2 (two) times daily. 180 tablet 3   spironolactone (ALDACTONE) 25 MG tablet Take 1 tablet (25 mg total) by mouth daily. 90 tablet 3   No current facility-administered medications on file prior to visit.    Allergies  Allergen Reactions   Penicillins  Shortness Of Breath    Trouble breathing    Sulfa Antibiotics Other (See Comments)    Causes Thrush     Social History   Substance and Sexual Activity  Alcohol Use Not Currently    Social History   Tobacco Use  Smoking Status Former   Packs/day: 0.50   Years: 25.00   Additional pack years: 0.00   Total pack years: 12.50   Types: Cigarettes   Quit date: 2002   Years since quitting: 22.4   Passive exposure: Current  Smokeless Tobacco Never    Review of Systems  Constitutional:  Positive for malaise/fatigue.  HENT:  Positive for sinus pain.   Eyes: Negative.   Respiratory:  Positive for cough and shortness of breath.   Cardiovascular: Negative.   Gastrointestinal:   Positive for abdominal pain.  Genitourinary: Negative.   Musculoskeletal:  Positive for back pain and joint pain.  Skin: Negative.   Neurological: Negative.   Endo/Heme/Allergies: Negative.   Psychiatric/Behavioral: Negative.      Objective   Vitals:   02/10/23 1041  BP: 96/63  Pulse: 68  Resp: 12  Temp: 98.2 F (36.8 C)  SpO2: 94%    Physical Exam Vitals reviewed.  Constitutional:      Appearance: Normal appearance. She is normal weight. She is not ill-appearing.  HENT:     Head: Normocephalic and atraumatic.  Cardiovascular:     Rate and Rhythm: Normal rate and regular rhythm.     Heart sounds: Normal heart sounds. No murmur heard.    No friction rub. No gallop.  Pulmonary:     Effort: Pulmonary effort is normal. No respiratory distress.     Breath sounds: Normal breath sounds. No stridor. No wheezing, rhonchi or rales.  Skin:    General: Skin is warm and dry.     Comments: Small several millimeter subcutaneous mobile nodules present over the right hip.  A punctum is present over it.  Neurological:     Mental Status: She is alert and oriented to person, place, and time.    Cardiac history reviewed.  Last echo showed less than 20% ejection fraction Assessment  Epidermoid cyst, right hip region. Severe congestive heart failure Plan  I reassured the patient that this appeared to be just a residual cyst.  Any surgical invention would have to be under local anesthesia given her severe cardiomyopathy.  She will monitor the nodule.  Follow-up as needed.

## 2023-02-21 ENCOUNTER — Other Ambulatory Visit (HOSPITAL_COMMUNITY): Payer: Self-pay | Admitting: Cardiology

## 2023-02-21 ENCOUNTER — Encounter (HOSPITAL_COMMUNITY): Payer: 59

## 2023-02-21 ENCOUNTER — Ambulatory Visit (HOSPITAL_COMMUNITY)
Admission: RE | Admit: 2023-02-21 | Discharge: 2023-02-21 | Disposition: A | Discharge status: Home / Self Care | Payer: 59 | Source: Ambulatory Visit | Attending: Cardiology | Admitting: Cardiology

## 2023-02-21 DIAGNOSIS — I5022 Chronic systolic (congestive) heart failure: Secondary | ICD-10-CM | POA: Insufficient documentation

## 2023-02-21 LAB — BASIC METABOLIC PANEL
Anion gap: 8 (ref 5–15)
BUN: 13 mg/dL (ref 8–23)
CO2: 25 mmol/L (ref 22–32)
Calcium: 9.5 mg/dL (ref 8.9–10.3)
Chloride: 105 mmol/L (ref 98–111)
Creatinine, Ser: 0.87 mg/dL (ref 0.44–1.00)
GFR, Estimated: >60 mL/min (ref >60)
Glucose, Bld: 110 mg/dL — ABNORMAL HIGH (ref 70–99)
Potassium: 4.6 mmol/L (ref 3.5–5.1)
Sodium: 138 mmol/L (ref 135–145)

## 2023-02-24 ENCOUNTER — Other Ambulatory Visit (HOSPITAL_COMMUNITY): Payer: Self-pay | Admitting: Cardiology

## 2023-03-08 ENCOUNTER — Encounter (HOSPITAL_COMMUNITY): Payer: 59

## 2023-03-21 ENCOUNTER — Ambulatory Visit (INDEPENDENT_AMBULATORY_CARE_PROVIDER_SITE_OTHER): Payer: 59

## 2023-03-21 DIAGNOSIS — I5022 Chronic systolic (congestive) heart failure: Secondary | ICD-10-CM

## 2023-03-23 LAB — CUP PACEART REMOTE DEVICE CHECK
Battery Remaining Longevity: 58 mo
Battery Remaining Percentage: 85 %
Battery Voltage: 3.02 V
Brady Statistic AP VP Percent: 8.5 %
Brady Statistic AP VS Percent: 1 %
Brady Statistic AS VP Percent: 91 %
Brady Statistic AS VS Percent: 1 %
Brady Statistic RA Percent Paced: 8.4 %
Date Time Interrogation Session: 20240722020019
HighPow Impedance: 68 Ohm
HighPow Impedance: 68 Ohm
Implantable Lead Connection Status: 753985
Implantable Lead Connection Status: 753985
Implantable Lead Connection Status: 753985
Implantable Lead Implant Date: 20231023
Implantable Lead Implant Date: 20231023
Implantable Lead Implant Date: 20231023
Implantable Lead Location: 753858
Implantable Lead Location: 753859
Implantable Lead Location: 753860
Implantable Lead Model: 7122
Implantable Pulse Generator Implant Date: 20231023
Lead Channel Impedance Value: 1025 Ohm
Lead Channel Impedance Value: 490 Ohm
Lead Channel Impedance Value: 530 Ohm
Lead Channel Pacing Threshold Amplitude: 0.5 V
Lead Channel Pacing Threshold Amplitude: 0.875 V
Lead Channel Pacing Threshold Amplitude: 2.75 V
Lead Channel Pacing Threshold Pulse Width: 0.5 ms
Lead Channel Pacing Threshold Pulse Width: 0.5 ms
Lead Channel Pacing Threshold Pulse Width: 1 ms
Lead Channel Sensing Intrinsic Amplitude: 12 mV
Lead Channel Sensing Intrinsic Amplitude: 3.1 mV
Lead Channel Setting Pacing Amplitude: 2 V
Lead Channel Setting Pacing Amplitude: 2 V
Lead Channel Setting Pacing Amplitude: 3.5 V
Lead Channel Setting Pacing Pulse Width: 0.5 ms
Lead Channel Setting Pacing Pulse Width: 1 ms
Lead Channel Setting Sensing Sensitivity: 0.5 mV
Pulse Gen Serial Number: 8949490
Zone Setting Status: 755011

## 2023-03-25 ENCOUNTER — Other Ambulatory Visit (HOSPITAL_COMMUNITY): Payer: Self-pay | Admitting: Nurse Practitioner

## 2023-03-25 DIAGNOSIS — Z13820 Encounter for screening for osteoporosis: Secondary | ICD-10-CM

## 2023-03-28 ENCOUNTER — Ambulatory Visit: Payer: 59 | Admitting: Physician Assistant

## 2023-03-28 NOTE — Progress Notes (Deleted)
  Electrophysiology Office Note:   Date:  03/28/2023  ID:  Stacey Hartman, DOB Feb 08, 1956, MRN [id number]  Primary Cardiologist: Thomasene Ripple, DO Electrophysiologist: Sherryl Manges, MD  {Click to update primary MD,subspecialty MD or APP then REFRESH:1}    History of Present Illness:   Stacey Hartman is a 67 y.o. female with h/o tobacco abuse, COPD, pulmonary HTN, CVA, Raynaud's, hypothyroidism, HLD, LBBB, chronic s-CHF, NICM s/p ICD,  seen today for routine electrophysiology followup.   Last  seen in EP clinic 11/2022 - volume status was good at that time but EKG was less than ideal and plan was to have her return for lead reassessment and optimization. She did not return to EP Clinic but had a visit with the CHF clinic and EKG at that time had a QRS duration of 146 ms ( in 11/2022).   Since last being seen in our clinic the patient reports doing ***.  she denies chest pain, palpitations, dyspnea, PND, orthopnea, nausea, vomiting, dizziness, syncope, edema, weight gain, or early satiety.   Review of systems complete and found to be negative unless listed in HPI.      EP Information / Studies Reviewed:    EKG is not ordered today. EKG from 01/19/23 reviewed which showed AS, VP, ventricular rate 68      PPM Interrogation-  reviewed in detail today,  See PACEART report.  Device History: Abbott BiV PPM implanted 06/21/22 for  NICM / Chronic Systolic CHF with LVEF <20%  Studies:  [facility] 10/05/21 > normal RA pressure & PCWP though LVEDP elevated. PCWP repeated and remained low. Preserved CO. Normal PA pressure, no significant CAD. 01/26/22 TTE > LVEF <20%, severely decreased function, LV demonstrates global hypokinesis, LV internal cavity moderately dilated, G1 DD, RV systolic function normal.   Risk Assessment/Calculations:     No BP recorded.  {Refresh Note OR Click here to enter BP  :1}***        Physical Exam:   VS:  LMP  (LMP Unknown)    Wt Readings from Last 3 Encounters:   02/10/23 110 lb (49.9 kg)  01/19/23 109 lb 12.8 oz (49.8 kg)  01/07/23 112 lb (50.8 kg)     GEN: Well nourished, well developed in no acute distress NECK: No JVD; No carotid bruits CARDIAC: {EPRHYTHM:28826}, no murmurs, rubs, gallops RESPIRATORY:  Clear to auscultation without rales, wheezing or rhonchi  ABDOMEN: Soft, non-tender, non-distended EXTREMITIES:  No edema; No deformity   ASSESSMENT AND PLAN:    NICM / Chronic Systolic CHF s/p Abbott PPM  LBBB Chronic Systolic CHF  Stron family hx of CHF, possible familial cardiomyopathy but gene testing negative. ECHO with LVEF <20%. R/LHC negative for coronary disease. Now s/p CRT-D.  NYHA II symptoms.  -Normal ICD function -QRS duration ? Alter  -See Pace Art report -No changes today  CVA  Occurred during cMRI.  Punctate acute infarct in R frontal cortex. No AF detected during visit. 30d monitor with no AF.  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  -followed by Dr. Sherene Sires -consider pulmonary cachexia as potential source weight loss  Weight Loss  -sigmoidoscopy 9/23 with no evidence of malignancy   Disposition:   Follow up with {EPPROVIDERS:28135} {EPFOLLOW WU:98119}  Signed, Canary Brim, MSN, APRN, NP-C, AGACNP-BC [facility] - Electrophysiology  03/28/2023, 8:05 AM

## 2023-04-08 NOTE — Progress Notes (Signed)
Remote ICD transmission.   

## 2023-04-11 ENCOUNTER — Other Ambulatory Visit (HOSPITAL_COMMUNITY): Payer: Self-pay | Admitting: Nurse Practitioner

## 2023-04-11 DIAGNOSIS — R109 Unspecified abdominal pain: Secondary | ICD-10-CM

## 2023-04-12 ENCOUNTER — Other Ambulatory Visit (HOSPITAL_COMMUNITY): Payer: 59

## 2023-04-13 ENCOUNTER — Other Ambulatory Visit (HOSPITAL_COMMUNITY): Payer: Self-pay | Admitting: Cardiology

## 2023-04-19 ENCOUNTER — Ambulatory Visit (HOSPITAL_BASED_OUTPATIENT_CLINIC_OR_DEPARTMENT_OTHER)
Admission: RE | Admit: 2023-04-19 | Discharge: 2023-04-19 | Disposition: A | Discharge status: Home / Self Care | Payer: 59 | Source: Ambulatory Visit | Attending: Cardiology | Admitting: Cardiology

## 2023-04-19 ENCOUNTER — Encounter (HOSPITAL_COMMUNITY): Payer: Self-pay | Admitting: Cardiology

## 2023-04-19 ENCOUNTER — Ambulatory Visit (HOSPITAL_COMMUNITY)
Admission: RE | Admit: 2023-04-19 | Discharge: 2023-04-19 | Disposition: A | Discharge status: Home / Self Care | Payer: 59 | Source: Ambulatory Visit | Attending: Cardiology | Admitting: Cardiology

## 2023-04-19 VITALS — BP 112/70 | HR 60 | Wt 109.0 lb

## 2023-04-19 DIAGNOSIS — I5022 Chronic systolic (congestive) heart failure: Secondary | ICD-10-CM | POA: Insufficient documentation

## 2023-04-19 DIAGNOSIS — I428 Other cardiomyopathies: Secondary | ICD-10-CM | POA: Insufficient documentation

## 2023-04-19 DIAGNOSIS — R634 Abnormal weight loss: Secondary | ICD-10-CM | POA: Diagnosis not present

## 2023-04-19 DIAGNOSIS — J449 Chronic obstructive pulmonary disease, unspecified: Secondary | ICD-10-CM | POA: Insufficient documentation

## 2023-04-19 DIAGNOSIS — Z7902 Long term (current) use of antithrombotics/antiplatelets: Secondary | ICD-10-CM | POA: Insufficient documentation

## 2023-04-19 DIAGNOSIS — Z681 Body mass index (BMI) 19 or less, adult: Secondary | ICD-10-CM | POA: Diagnosis not present

## 2023-04-19 DIAGNOSIS — Z8673 Personal history of transient ischemic attack (TIA), and cerebral infarction without residual deficits: Secondary | ICD-10-CM | POA: Diagnosis not present

## 2023-04-19 DIAGNOSIS — R9431 Abnormal electrocardiogram [ECG] [EKG]: Secondary | ICD-10-CM | POA: Insufficient documentation

## 2023-04-19 DIAGNOSIS — Z87891 Personal history of nicotine dependence: Secondary | ICD-10-CM | POA: Diagnosis not present

## 2023-04-19 DIAGNOSIS — E039 Hypothyroidism, unspecified: Secondary | ICD-10-CM | POA: Diagnosis not present

## 2023-04-19 DIAGNOSIS — E785 Hyperlipidemia, unspecified: Secondary | ICD-10-CM | POA: Insufficient documentation

## 2023-04-19 DIAGNOSIS — I447 Left bundle-branch block, unspecified: Secondary | ICD-10-CM | POA: Insufficient documentation

## 2023-04-19 DIAGNOSIS — I361 Nonrheumatic tricuspid (valve) insufficiency: Secondary | ICD-10-CM | POA: Diagnosis not present

## 2023-04-19 LAB — BASIC METABOLIC PANEL
Anion gap: 9 (ref 5–15)
BUN: 12 mg/dL (ref 8–23)
CO2: 27 mmol/L (ref 22–32)
Calcium: 9.4 mg/dL (ref 8.9–10.3)
Chloride: 104 mmol/L (ref 98–111)
Creatinine, Ser: 0.82 mg/dL (ref 0.44–1.00)
GFR, Estimated: >60 mL/min (ref >60)
Glucose, Bld: 87 mg/dL (ref 70–99)
Potassium: 4.1 mmol/L (ref 3.5–5.1)
Sodium: 140 mmol/L (ref 135–145)

## 2023-04-19 LAB — ECHOCARDIOGRAM COMPLETE
Area-P 1/2: 3.17 cm2
S' Lateral: 3.25 cm

## 2023-04-19 LAB — BRAIN NATRIURETIC PEPTIDE: B Natriuretic Peptide: 64.1 pg/mL (ref 0.0–100.0)

## 2023-04-19 MED ORDER — METOPROLOL SUCCINATE ER 50 MG PO TB24: 50.0000 mg | ORAL_TABLET | Freq: Every day | ORAL | 1 refills | Status: DC

## 2023-04-19 NOTE — Patient Instructions (Signed)
Good to see you today!  INCREASE Toprol  to 50 mg(1 tablet) nightly  Drink Ensure nutrition supplement 1-2 x daily  Labs done today, your results will be available in MyChart, we will contact you for abnormal readings.  Your physician recommends that you schedule a follow-up appointment in: 3 months as scheduled    If you have any questions or concerns before your next appointment please send [place] a message through [place] or call our office at [phone].    TO LEAVE A MESSAGE FOR THE NURSE SELECT OPTION 2, PLEASE LEAVE A MESSAGE INCLUDING: YOUR NAME DATE OF BIRTH CALL BACK NUMBER REASON FOR CALL**this is important as we prioritize the call backs  YOU WILL RECEIVE A CALL BACK THE SAME DAY AS LONG AS YOU CALL BEFORE 4:00 PM  At the [facility], you and your health needs are our priority. As part of our continuing mission to provide you with exceptional heart care, we have created designated Provider Care Teams. These Care Teams include your primary Cardiologist (physician) and Advanced Practice Providers (APPs- Physician Assistants and Nurse Practitioners) who all work together to provide you with the care you need, when you need it.   You may see any of the following providers on your designated Care Team at your next follow up: Dr Arvilla Meres Dr Marca Ancona Dr. Marcos Eke, NP Robbie Lis, [place] [facility] [place], [place] Brynda Peon, NP Karle Plumber, PharmD   Please be sure to bring in all your medications bottles to every appointment.    Thank you for choosing [facility]

## 2023-04-20 ENCOUNTER — Ambulatory Visit (HOSPITAL_COMMUNITY): Payer: 59

## 2023-04-20 ENCOUNTER — Other Ambulatory Visit (HOSPITAL_COMMUNITY): Payer: 59

## 2023-04-20 ENCOUNTER — Encounter (HOSPITAL_COMMUNITY): Payer: Self-pay

## 2023-04-20 NOTE — Progress Notes (Signed)
ADVANCED HF CLINIC NOTE   Primary Care: Smith Robert, MD HF Cardiologist: Dr. Shirlee Latch  HPI: Ms Ogletree is a 67 y.o. with a history of hypothyroidism, former tobacco abuse (quit 20 years ago), COPD, and systolic heart failure with severely reduced EF.    Father with "MI," nephew died with heart failure at 32 yrs old.    Echo 2/23 EF < 20% RV normal.  Sent to ED from cardiology office after echo completed.  Had CP and SOB in waiting room/triage and taken for urgent R/LHC. Cardiac cath with no significant CAD, normal PA and PCWP, LVEDP 28 mmHg, CO preserved.  cMRI with LVEF 12%, no myocardial LGE, elevated T1 indices, ECV 42% in basal septum. Developed acute left sided facial numbness during cMRI. Code stroke called. MRI brain with punctate acute infarct in high right frontal cortex. No AF detected during admit. 30-day monitor showed no AF either. PYP equivocal for transthyretin amyloidosis (grade 1, H/CL 1.47). She was discharged home, weight 122 lbs.  Echo 5/23, EF remained < 20%, moderate LV dilation, normal RV, IVC normal.  Referred to EP for CRT-D.  Follow up 6/23 with Dr. Graciela Husbands to discuss CRT-D. Concern for on-going weight loss and CT chest A/P ordered to rule out malignancy before proceeding with CRT. This showed rectal wall thickening, concerning for malignancy, however no other findings to explain weight loss.  She saw GI, noted to have an unremarkable c-scope about a year ago.  Had sigmoidoscopy 9/23 with no evidence of malignancy.  S/p St. Jude CRT-D 10/23.  Echo was done today and reviewed, EF 40-45%, RV normal, IVC normal.   Today she returns for HF follow up. Weight stable.  She has occasional lightheadedness with standing, thinks Entresto triggers this.  No falls/syncope.  Breathing doing well overall, only dyspneic walking a long distance, up hills or stairs.  No chest pain.  No orthopnea/PND.    St Jude CRT-D device interrogation: >99% BiV pacing, no AF/VT, stable thoracic  impedance.   ECG (personally reviewed): A-BiV pacing  Labs (2/23): K 3.9, creatinine 0.63, hgb 12.3 Labs (4/23): K 4.2, creatinine 0.75 Labs (6/23): K 4.0, creatinine 0.76, LDL 48, HDL 47 Labs (9/23): K 4.2, creatinine 0.62, BNP 366 Labs (10/23): K 4.5, creatinine 0.93 Labs (12/23): K 4.3, creatinine 0.71 Labs (6/24): K 4.6, creatinine 0.87  PMH: 1. Hypothyroidism 2. 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 Fibromyalgia 4. Raynaud's phenomenon 5. Chronic systolic CHF: Nonischemic cardiomyopathy. Negative Inv[employer]ae gene testing.  - Echo (2/23): EF < 20%, RV normal.  - LHC/RHC (2/23): mean RA 2, PA 20/12 mean 13, mean PCWP 7, CI 2.2 Fick/2.79 thermo; no significant CAD.  - Cardiac MRI (2/23): LV EF 12%. No myocardial LGE. Elevated T1 indices, ECV was 42% in the basal septum.  - PYP (2/23): Grade 1, H/CL 1.47 (equivocal); TTR gene testing negative.  - Echo (5/23): EF < 20%, moderate LV dilation, normal RV, IVC normal.  - s/p St. Jude CRT-D (10/23). - Echo (8/24): EF 40-45%, RV normal, IVC normal. 6. CVA: 2/23.  - Zio monitor x 1 month showed no AF.  7. HCV: Treated.   Current Outpatient Medications  Medication Sig Dispense Refill   atorvastatin (LIPITOR) 40 MG tablet Take 1 tablet (40 mg total) by mouth daily. 90 tablet 3   carboxymethylcellulose (REFRESH TEARS) 0.5 % SOLN Place 1 drop into both eyes 3 (three) times daily as needed (dry eyes).  clonazePAM (KLONOPIN) 0.5 MG tablet Take 0.5 mg by mouth 2 (two) times daily.     clopidogrel (PLAVIX) 75 MG tablet TAKE ONE TABLET BY MOUTH EVERY DAY 30 tablet 6   dapagliflozin propanediol (FARXIGA) 10 MG TABS tablet Take 1 tablet (10 mg total) by mouth daily. 90 tablet 3   esomeprazole (NEXIUM) 20 MG capsule Take 20 mg by mouth daily as needed.     gabapentin (NEURONTIN) 300 MG capsule Take 300 mg by mouth at bedtime.     levothyroxine (SYNTHROID) 150 MCG tablet TAKE (1) TABLET BY  MOUTH DAILY BEFORE BREAKFAST. 30 tablet 12   sacubitril-valsartan (ENTRESTO) 24-26 MG Take 1 tablet by mouth 2 (two) times daily. 180 tablet 3   spironolactone (ALDACTONE) 25 MG tablet TAKE ONE TABLET BY MOUTH EVERY DAY 90 tablet 3   metoprolol succinate (TOPROL-XL) 50 MG 24 hr tablet Take 1 tablet (50 mg total) by mouth at bedtime. Take with or immediately following a meal. 90 tablet 1   No current facility-administered medications for this encounter.   Allergies  Allergen Reactions   Penicillins Shortness Of Breath    Trouble breathing    Sulfa Antibiotics Other (See Comments)    Causes Thrush    Social History   Socioeconomic History   Marital status: Widowed    Spouse name: Not on file   Number of children: 2   Years of education: Not on file   Highest education level: Not on file  Occupational History   Occupation: disabled  Tobacco Use   Smoking status: Former    Current packs/day: 0.00    Average packs/day: 0.5 packs/day for 25.0 years (12.5 ttl pk-yrs)    Types: Cigarettes    Start date: 45    Quit date: 2002    Years since quitting: 22.6    Passive exposure: Current   Smokeless tobacco: Never  Vaping Use   Vaping status: Never Used  Substance and Sexual Activity   Alcohol use: Not Currently   Drug use: Never   Sexual activity: Not on file  Other Topics Concern   Not on file  Social History Narrative   Not on file   Social Determinants of Health   Financial Resource Strain: Not on file  Food Insecurity: Not on file  Transportation Needs: Not on file  Physical Activity: Not on file  Stress: Not on file  Social Connections: Not on file  Intimate Partner Violence: Not on file   Family History  Problem Relation Age of Onset   Melanoma Mother    Heart attack Father    Heart disease Father    Melanoma Sister    Melanoma Brother    Heart failure Brother    Colon cancer Maternal Grandmother    Healthy Son    Stomach cancer Maternal Uncle     Esophageal cancer Neg Hx    Rectal cancer Neg Hx    BP 112/70   Pulse 60   Wt 49.4 kg (109 lb)   LMP  (LMP Unknown)   SpO2 98%   BMI 19.31 kg/m   Wt Readings from Last 3 Encounters:  04/19/23 49.4 kg (109 lb)  02/10/23 49.9 kg (110 lb)  01/19/23 49.8 kg (109 lb 12.8 oz)   General: NAD, thin.  Neck: No JVD, no thyromegaly or thyroid nodule.  Lungs: Clear to auscultation bilaterally with normal respiratory effort. CV: Nondisplaced PMI.  Heart regular S1/S2, no S3/S4, no murmur.  No peripheral edema.  No carotid  bruit.  Normal pedal pulses.  Abdomen: Soft, nontender, no hepatosplenomegaly, no distention.  Skin: Intact without lesions or rashes.  Neurologic: Alert and oriented x 3.  Psych: Normal affect. Extremities: No clubbing or cyanosis.  HEENT: Normal.   ASSESSMENT & PLAN: 1. Chronic systolic CHF/NICM/LBBB: Etiology not certain. No CAD on LHC. 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 She has a strong family history of CHF (father died at 67 with "MI", brother with "CHF," nephew with "CHF" died at 44). Possible familial cardiomyopathy but [employer]ae gene testing was negative.  Echo 2/23 showed EF < 20% with diffuse hypokinesis and normal RV.  No LV thrombus noted. R/LHC in 2/23 showed no significant coronary disease, normal RA pressure and PCWP, LVEDP 28 mmHg, preserved CO. cMRI (2/23) showed no myocardial LGE, ECV 42% in the basal septum. PYP not suggestive of TTR amyloidosis (at most equivocal) and genetic testing for TTR mutation was negative. Echo 5/23 EF remains < 20% with relatively normal RV.  Now s/p St Jude CRT-D.  Echo today showed improvement, EF 40-45%, RV normal, IVC normal.  NYHA II symptoms. GDMT has been limited by low BP.  - Increase Toprol XL to 50 mg at bedtime. - Continue spiro 25 mg daily.  BMET/BNP today.  - Continue Entresto 24/26 bid, will not increase due to orthostatic symptoms.  - Continue Farxiga 10 mg daily.  2. CVA: Code Stroke during cMRI. Head CT  negative, MRI brain + punctate acute infarct in the high right frontal cortex. CTA Head and Neck no LVO. Suspect infarct due to probable cardio embolic and small vessel disease. No Afib detected during admit. 30 day monitor in 3/23 showed no Afib. - Continue Plavix, no ASA.  - Continue atorvastatin 3. 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  However, PFTs in 4/23 showed only minimal obstruction.  CT chest (7/23) showed emphysema as well as several cysts and nodules raising suspicion for Langerhans cell histiocytosis. She has seen Dr. Sherene Sires for pulmonary, not thought to have major lung pathology.  4. Weight loss:  CT chest, A/P showed rectal wall thickening, concerning for malignancy. Patient tells me she had had full work up by GI w/ colonoscopy last year and had 2 polyps removed.  No malignancy on sigmoidoscopy 9/23.  - She will drink Ensure daily.   Keep follow up in 3 months with APP.   Marca Ancona  04/20/23

## 2023-04-21 ENCOUNTER — Other Ambulatory Visit (HOSPITAL_COMMUNITY): Payer: Self-pay | Admitting: Cardiology

## 2023-04-25 ENCOUNTER — Ambulatory Visit (HOSPITAL_COMMUNITY): Payer: 59

## 2023-04-26 ENCOUNTER — Ambulatory Visit (HOSPITAL_COMMUNITY)
Admission: RE | Admit: 2023-04-26 | Discharge: 2023-04-26 | Disposition: A | Discharge status: Home / Self Care | Payer: 59 | Source: Ambulatory Visit | Attending: Nurse Practitioner | Admitting: Nurse Practitioner

## 2023-04-26 DIAGNOSIS — R109 Unspecified abdominal pain: Secondary | ICD-10-CM | POA: Insufficient documentation

## 2023-05-11 ENCOUNTER — Ambulatory Visit: Payer: 59 | Attending: Internal Medicine | Admitting: Internal Medicine

## 2023-05-11 VITALS — BP 86/58 | HR 60 | Ht 63.5 in | Wt 110.0 lb

## 2023-05-11 DIAGNOSIS — I428 Other cardiomyopathies: Secondary | ICD-10-CM | POA: Diagnosis not present

## 2023-05-11 DIAGNOSIS — I5022 Chronic systolic (congestive) heart failure: Secondary | ICD-10-CM

## 2023-05-11 DIAGNOSIS — I447 Left bundle-branch block, unspecified: Secondary | ICD-10-CM

## 2023-05-11 DIAGNOSIS — Z95810 Presence of automatic (implantable) cardiac defibrillator: Secondary | ICD-10-CM

## 2023-05-11 NOTE — Progress Notes (Signed)
ELECTROPHYSIOLOGY OFFICE  NOTE  Patient ID: Stacey Hartman, MRN: [id number], DOB/AGE: 67-May-1957 67 y.o. Admit date: (Not on file) Date of Consult: 05/11/2023  Primary Physician: Smith Robert, MD Primary Cardiologist: DM  HPI Stacey Hartman is a 67 y.o. female seen in follow-up for CRT-D implanted 10/23 for nonischemic cardiomyopathy congestive heart failure and left bundle branch block  History of punctate infarct in the right frontal cortex; no known atrial fibrillation  Improved dyspnea following CRT. The patient denies chest pain, , nocturnal dyspnea, orthopnea or peripheral edema.  There have been no palpitations, lightheadedness or syncope.  Complains of shortness of breath but this has been much improved since device implantation.Marland Kitchen      DATE TEST EF   2/23 Echo   <20 %   2/23 LHC   % No obstructive CAD  2/23 cMRI  12% No LGE  2/23 PYP  Non diagnostic  5/23 Echo  <20%   8/24 Echo  40-45%         Date Cr K Hgb  6/23 0.76 4.0 12.3   8/24 0.82 4.1         Past Medical History:  Diagnosis Date   Anxiety    Chronic systolic CHF (congestive heart failure), NYHA class 3 (HCC)    Fibromyalgia    Hypothyroid    LBBB (left bundle branch block)    NICM (nonischemic cardiomyopathy) (HCC)    Thyroid disease    Weight loss, non-intentional       Surgical History:  Past Surgical History:  Procedure Laterality Date   ANKLE SURGERY Right    BIV ICD INSERTION CRT-D N/A 06/21/2022   Procedure: BIV ICD INSERTION CRT-D;  Surgeon: Duke Salvia, MD;  Location: [facility] INVASIVE CV LAB;  Service: Cardiovascular;  Laterality: N/A;   CHOLECYSTECTOMY     FLEXIBLE SIGMOIDOSCOPY N/A 05/24/2022   Procedure: FLEXIBLE SIGMOIDOSCOPY;  Surgeon: Sherrilyn Rist, MD;  Location: [facility] ENDOSCOPY;  Service: Gastroenterology;  Laterality: N/A;  NO SEDATION   RIGHT/LEFT HEART CATH AND CORONARY ANGIOGRAPHY N/A 10/05/2021   Procedure: RIGHT/LEFT HEART CATH AND CORONARY ANGIOGRAPHY;  Surgeon:  Laurey Morale, MD;  Location: [facility] INVASIVE CV LAB;  Service: Cardiovascular;  Laterality: N/A;   TUBAL LIGATION       Home Meds: No outpatient medications have been marked as taking for the 05/11/23 encounter (Office Visit) with Duke Salvia, MD.    Allergies:  Allergies  Allergen Reactions   Penicillins Shortness Of Breath    Trouble breathing    Sulfa Antibiotics Other (See Comments)    Causes Thrush     Social History   Socioeconomic History   Marital status: Widowed    Spouse name: Not on file   Number of children: 2   Years of education: Not on file   Highest education level: Not on file  Occupational History   Occupation: disabled  Tobacco Use   Smoking status: Former    Current packs/day: 0.00    Average packs/day: 0.5 packs/day for 25.0 years (12.5 ttl pk-yrs)    Types: Cigarettes    Start date: 82    Quit date: 2002    Years since quitting: 22.7    Passive exposure: Current   Smokeless tobacco: Never  Vaping Use   Vaping status: Never Used  Substance and Sexual Activity   Alcohol use: Not Currently   Drug use: Never   Sexual activity: Not on file  Other Topics Concern  Not on file  Social History Narrative   Not on file   Social Determinants of Health   Financial Resource Strain: Not on file  Food Insecurity: Not on file  Transportation Needs: Not on file  Physical Activity: Not on file  Stress: Not on file  Social Connections: Not on file  Intimate Partner Violence: Not on file     Family History  Problem Relation Age of Onset   Melanoma Mother    Heart attack Father    Heart disease Father    Melanoma Sister    Melanoma Brother    Heart failure Brother    Colon cancer Maternal Grandmother    Healthy Son    Stomach cancer Maternal Uncle    Esophageal cancer Neg Hx    Rectal cancer Neg Hx      ROS:  Please see the history of present illness.     All other systems reviewed and negative.    Physical Exam: BP (!) 86/58    Pulse 60   Ht 5' 3.5" (1.613 m)   Wt 110 lb (49.9 kg)   LMP  (LMP Unknown)   SpO2 97%   BMI 19.18 kg/m  Well developed and well nourished in no acute distress HENT normal Neck supple with JVP-flat Clear Device pocket well healed; without hematoma or erythema.  There is no tethering  Regular rate and rhythm, no  murmur Abd-soft with active BS No Clubbing cyanosis  edema Skin-warm and dry A & Oriented  Grossly normal sensory and motor function  ECG sinus with upright QRS lead I and neg lead V1 QRSd 156 >> Multiple reprogramming's lead to a vector change as she was not LV pacing secondary to threshold.  No QRSd was 96 ms by the computer clearly less than 120 by my eye  5/24 QRSd 146  Post implant QRSd was 128 ms  Device function is  normal. Programming changes (As above)   See Paceart for details     Assessment and Plan:  Nonischemic cardiomyopathy  Left bundle branch block  Congestive heart failure-class IIa  Weight loss-progressive with another 5% over the last 3 months despite appetite  CRT-D-Saint Jude (DOI 10/23)  Congestive status much improved.  Continue metoprolol Aldactone and Entresto.  As noted, was subthreshold at office entry, reprogramming and factor survey prompted a change to 2--4 and a QRSd as noted above  Weight loss is finally stabilized          Sherryl Manges

## 2023-05-11 NOTE — Patient Instructions (Signed)

## 2023-05-12 ENCOUNTER — Ambulatory Visit (HOSPITAL_COMMUNITY)
Admission: RE | Admit: 2023-05-12 | Discharge: 2023-05-12 | Disposition: A | Discharge status: Home / Self Care | Payer: 59 | Source: Ambulatory Visit | Attending: Nurse Practitioner | Admitting: Nurse Practitioner

## 2023-05-12 DIAGNOSIS — Z13820 Encounter for screening for osteoporosis: Secondary | ICD-10-CM | POA: Diagnosis present

## 2023-05-12 DIAGNOSIS — M8589 Other specified disorders of bone density and structure, multiple sites: Secondary | ICD-10-CM | POA: Diagnosis not present

## 2023-05-12 DIAGNOSIS — Z780 Asymptomatic menopausal state: Secondary | ICD-10-CM | POA: Diagnosis not present

## 2023-05-15 ENCOUNTER — Other Ambulatory Visit (HOSPITAL_COMMUNITY): Payer: Self-pay | Admitting: Cardiology

## 2023-05-18 ENCOUNTER — Other Ambulatory Visit (HOSPITAL_COMMUNITY): Payer: Self-pay | Admitting: Cardiology

## 2023-06-15 ENCOUNTER — Other Ambulatory Visit (HOSPITAL_COMMUNITY): Payer: Self-pay | Admitting: Internal Medicine

## 2023-06-20 ENCOUNTER — Ambulatory Visit: Payer: 59

## 2023-06-20 DIAGNOSIS — I428 Other cardiomyopathies: Secondary | ICD-10-CM | POA: Diagnosis not present

## 2023-06-20 DIAGNOSIS — I447 Left bundle-branch block, unspecified: Secondary | ICD-10-CM

## 2023-06-20 LAB — CUP PACEART REMOTE DEVICE CHECK
Battery Remaining Longevity: 56 mo
Battery Remaining Percentage: 81 %
Battery Voltage: 2.99 V
Brady Statistic AP VP Percent: 7.3 %
Brady Statistic AP VS Percent: 1 %
Brady Statistic AS VP Percent: 93 %
Brady Statistic AS VS Percent: 1 %
Brady Statistic RA Percent Paced: 7.2 %
Date Time Interrogation Session: 20241021020016
HighPow Impedance: 64 Ohm
HighPow Impedance: 64 Ohm
Implantable Lead Connection Status: 753985
Implantable Lead Connection Status: 753985
Implantable Lead Connection Status: 753985
Implantable Lead Implant Date: 20231023
Implantable Lead Implant Date: 20231023
Implantable Lead Implant Date: 20231023
Implantable Lead Location: 753858
Implantable Lead Location: 753859
Implantable Lead Location: 753860
Implantable Lead Model: 7122
Implantable Pulse Generator Implant Date: 20231023
Lead Channel Impedance Value: 1050 Ohm
Lead Channel Impedance Value: 440 Ohm
Lead Channel Impedance Value: 480 Ohm
Lead Channel Pacing Threshold Amplitude: 0.5 V
Lead Channel Pacing Threshold Amplitude: 0.875 V
Lead Channel Pacing Threshold Amplitude: 2.5 V
Lead Channel Pacing Threshold Pulse Width: 0.5 ms
Lead Channel Pacing Threshold Pulse Width: 0.5 ms
Lead Channel Pacing Threshold Pulse Width: 1 ms
Lead Channel Sensing Intrinsic Amplitude: 11.3 mV
Lead Channel Sensing Intrinsic Amplitude: 2 mV
Lead Channel Setting Pacing Amplitude: 2 V
Lead Channel Setting Pacing Amplitude: 2 V
Lead Channel Setting Pacing Amplitude: 3.25 V
Lead Channel Setting Pacing Pulse Width: 0.5 ms
Lead Channel Setting Pacing Pulse Width: 1 ms
Lead Channel Setting Sensing Sensitivity: 0.5 mV
Pulse Gen Serial Number: 8949490
Zone Setting Status: 755011

## 2023-07-07 NOTE — Progress Notes (Signed)
Remote ICD transmission.   

## 2023-07-18 NOTE — Progress Notes (Signed)
ADVANCED HF CLINIC NOTE   Primary Care: Smith Robert, MD HF Cardiologist: Dr. Shirlee Latch  HPI: [name] is a 67 y.o. with a history of hypothyroidism, former tobacco abuse (quit 20 years ago), COPD, and systolic heart failure with severely reduced EF.    Father with "MI," nephew died with heart failure at 67 yrs old.    Echo 2/23 EF < 20% RV normal.  Sent to ED from cardiology office after echo completed.  Had CP and SOB in waiting room/triage and taken for urgent R/LHC. Cardiac cath with no significant CAD, normal PA and PCWP, LVEDP 28 mmHg, CO preserved.  cMRI with LVEF 12%, no myocardial LGE, elevated T1 indices, ECV 42% in basal septum. Developed acute left sided facial numbness during cMRI. Code stroke called. MRI brain with punctate acute infarct in high right frontal cortex. No AF detected during admit. 30-day monitor showed no AF either. PYP equivocal for transthyretin amyloidosis (grade 1, H/CL 1.47). She was discharged home, weight 122 lbs.  Echo 5/23, EF remained < 20%, moderate LV dilation, normal RV, IVC normal.  Referred to EP for CRT-D.  Follow up 6/23 with Dr. Graciela Husbands to discuss CRT-D. Concern for on-going weight loss and CT chest A/P ordered to rule out malignancy before proceeding with CRT. This showed rectal wall thickening, concerning for malignancy, however no other findings to explain weight loss.  She saw GI, noted to have an unremarkable c-scope about a year ago.  Had sigmoidoscopy 9/23 with no evidence of malignancy.  S/p St. Jude CRT-D 10/23.  Echo was done today and reviewed, EF 40-45%, RV normal, IVC normal.   Today she returns for HF follow up. Weight stable.  She has occasional lightheadedness with standing, thinks Entresto triggers this.  No falls/syncope.  Breathing doing well overall, only dyspneic walking a long distance, up hills or stairs.  No chest pain.  No orthopnea/PND.    St Jude CRT-D device interrogation: >99% BiV pacing, no AF/VT, stable thoracic  impedance.   ECG (personally reviewed): A-BiV pacing  Labs (2/23): K 3.9, creatinine 0.63, hgb 12.3 Labs (4/23): K 4.2, creatinine 0.75 Labs (6/23): K 4.0, creatinine 0.76, LDL 48, HDL 47 Labs (9/23): K 4.2, creatinine 0.62, BNP 366 Labs (10/23): K 4.5, creatinine 0.93 Labs (12/23): K 4.3, creatinine 0.71 Labs (6/24): K 4.6, creatinine 0.87  PMH: 1. Hypothyroidism 2. 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 Fibromyalgia 4. Raynaud's phenomenon 5. Chronic systolic CHF: Nonischemic cardiomyopathy. Negative [employer]ae gene testing.  - Echo (2/23): EF < 20%, RV normal.  - LHC/RHC (2/23): mean RA 2, PA 20/12 mean 13, mean PCWP 7, CI 2.2 Fick/2.79 thermo; no significant CAD.  - Cardiac MRI (2/23): LV EF 12%. No myocardial LGE. Elevated T1 indices, ECV was 42% in the basal septum.  - PYP (2/23): Grade 1, H/CL 1.47 (equivocal); TTR gene testing negative.  - Echo (5/23): EF < 20%, moderate LV dilation, normal RV, IVC normal.  - s/p St. Jude CRT-D (10/23). - Echo (8/24): EF 40-45%, RV normal, IVC normal. 6. CVA: 2/23.  - Zio monitor x 1 month showed no AF.  7. HCV: Treated.   Current Outpatient Medications  Medication Sig Dispense Refill   atorvastatin (LIPITOR) 40 MG tablet TAKE ONE TABLET BY MOUTH EVERY DAY 90 tablet 3   carboxymethylcellulose (REFRESH TEARS) 0.5 % SOLN Place 1 drop into both eyes 3 (three) times daily as needed (dry eyes).  clonazePAM (KLONOPIN) 0.5 MG tablet Take 0.5 mg by mouth 2 (two) times daily.     clopidogrel (PLAVIX) 75 MG tablet TAKE ONE TABLET BY MOUTH EVERY DAY 30 tablet 6   esomeprazole (NEXIUM) 20 MG capsule Take 20 mg by mouth daily as needed.     FARXIGA 10 MG TABS tablet TAKE ONE TABLET BY MOUTH EVERY DAY 90 tablet 3   gabapentin (NEURONTIN) 300 MG capsule Take 300 mg by mouth at bedtime.     levothyroxine (SYNTHROID) 150 MCG tablet TAKE (1) TABLET BY MOUTH DAILY BEFORE BREAKFAST. 30 tablet 12    metoprolol succinate (TOPROL-XL) 50 MG 24 hr tablet Take 1 tablet (50 mg total) by mouth at bedtime. Take with or immediately following a meal. 90 tablet 1   sacubitril-valsartan (ENTRESTO) 24-26 MG Take 1 tablet by mouth 2 (two) times daily. 180 tablet 3   spironolactone (ALDACTONE) 25 MG tablet TAKE ONE TABLET BY MOUTH EVERY DAY 90 tablet 3   No current facility-administered medications for this visit.   Allergies  Allergen Reactions   Penicillins Shortness Of Breath    Trouble breathing    Sulfa Antibiotics Other (See Comments)    Causes Thrush    Social History   Socioeconomic History   Marital status: Widowed    Spouse name: Not on file   Number of children: 2   Years of education: Not on file   Highest education level: Not on file  Occupational History   Occupation: disabled  Tobacco Use   Smoking status: Former    Current packs/day: 0.00    Average packs/day: 0.5 packs/day for 25.0 years (12.5 ttl pk-yrs)    Types: Cigarettes    Start date: 49    Quit date: 2002    Years since quitting: 22.8    Passive exposure: Current   Smokeless tobacco: Never  Vaping Use   Vaping status: Never Used  Substance and Sexual Activity   Alcohol use: Not Currently   Drug use: Never   Sexual activity: Not on file  Other Topics Concern   Not on file  Social History Narrative   Not on file   Social Determinants of Health   Financial Resource Strain: Not on file  Food Insecurity: Not on file  Transportation Needs: Not on file  Physical Activity: Not on file  Stress: Not on file  Social Connections: Not on file  Intimate Partner Violence: Not on file   Family History  Problem Relation Age of Onset   Melanoma Mother    Heart attack Father    Heart disease Father    Melanoma Sister    Melanoma Brother    Heart failure Brother    Colon cancer Maternal Grandmother    Healthy Son    Stomach cancer Maternal Uncle    Esophageal cancer Neg Hx    Rectal cancer Neg Hx    LMP   (LMP Unknown)   Wt Readings from Last 3 Encounters:  05/11/23 49.9 kg (110 lb)  04/19/23 49.4 kg (109 lb)  02/10/23 49.9 kg (110 lb)   General: NAD, thin.  Neck: No JVD, no thyromegaly or thyroid nodule.  Lungs: Clear to auscultation bilaterally with normal respiratory effort. CV: Nondisplaced PMI.  Heart regular S1/S2, no S3/S4, no murmur.  No peripheral edema.  No carotid bruit.  Normal pedal pulses.  Abdomen: Soft, nontender, no hepatosplenomegaly, no distention.  Skin: Intact without lesions or rashes.  Neurologic: Alert and oriented x 3.  Psych: Normal  affect. Extremities: No clubbing or cyanosis.  HEENT: Normal.   ASSESSMENT & PLAN: 1. Chronic systolic CHF/NICM/LBBB: Etiology not certain. No CAD on LHC. 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 She has a strong family history of CHF (father died at 75 with "MI", brother with "CHF," nephew with "CHF" died at 11). Possible familial cardiomyopathy but [employer]ae gene testing was negative.  Echo 2/23 showed EF < 20% with diffuse hypokinesis and normal RV.  No LV thrombus noted. R/LHC in 2/23 showed no significant coronary disease, normal RA pressure and PCWP, LVEDP 28 mmHg, preserved CO. cMRI (2/23) showed no myocardial LGE, ECV 42% in the basal septum. PYP not suggestive of TTR amyloidosis (at most equivocal) and genetic testing for TTR mutation was negative. Echo 5/23 EF remains < 20% with relatively normal RV.  Now s/p St Jude CRT-D.  Echo today showed improvement, EF 40-45%, RV normal, IVC normal.  NYHA II symptoms. GDMT has been limited by low BP.  - Increase Toprol XL to 50 mg at bedtime. - Continue spiro 25 mg daily.  BMET/BNP today.  - Continue Entresto 24/26 bid, will not increase due to orthostatic symptoms.  - Continue Farxiga 10 mg daily.  2. CVA: Code Stroke during cMRI. Head CT negative, MRI brain + punctate acute infarct in the high right frontal cortex. CTA Head and Neck no LVO. Suspect infarct due to probable cardio  embolic and small vessel disease. No Afib detected during admit. 30 day monitor in 3/23 showed no Afib. - Continue Plavix, no ASA.  - Continue atorvastatin 3. 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  However, PFTs in 4/23 showed only minimal obstruction.  CT chest (7/23) showed emphysema as well as several cysts and nodules raising suspicion for Langerhans cell histiocytosis. She has seen Dr. Sherene Sires for pulmonary, not thought to have major lung pathology.  4. Weight loss:  CT chest, A/P showed rectal wall thickening, concerning for malignancy. Patient tells me she had had full work up by GI w/ colonoscopy last year and had 2 polyps removed.  No malignancy on sigmoidoscopy 9/23.  - She will drink Ensure daily.   Keep follow up in 3 months with APP.   Anderson [facility]  07/18/23

## 2023-07-19 ENCOUNTER — Encounter (HOSPITAL_COMMUNITY): Payer: Self-pay

## 2023-07-19 ENCOUNTER — Ambulatory Visit (HOSPITAL_COMMUNITY)
Admission: RE | Admit: 2023-07-19 | Discharge: 2023-07-19 | Disposition: A | Discharge status: Home / Self Care | Payer: 59 | Source: Ambulatory Visit | Attending: Family Medicine

## 2023-07-19 VITALS — BP 96/60 | HR 75 | Ht 63.0 in | Wt 109.4 lb

## 2023-07-19 DIAGNOSIS — F959 Tic disorder, unspecified: Secondary | ICD-10-CM | POA: Diagnosis not present

## 2023-07-19 DIAGNOSIS — Z7902 Long term (current) use of antithrombotics/antiplatelets: Secondary | ICD-10-CM | POA: Diagnosis not present

## 2023-07-19 DIAGNOSIS — Z79899 Other long term (current) drug therapy: Secondary | ICD-10-CM | POA: Insufficient documentation

## 2023-07-19 DIAGNOSIS — R634 Abnormal weight loss: Secondary | ICD-10-CM | POA: Diagnosis not present

## 2023-07-19 DIAGNOSIS — I5022 Chronic systolic (congestive) heart failure: Secondary | ICD-10-CM | POA: Diagnosis not present

## 2023-07-19 DIAGNOSIS — I959 Hypotension, unspecified: Secondary | ICD-10-CM | POA: Insufficient documentation

## 2023-07-19 DIAGNOSIS — Z7984 Long term (current) use of oral hypoglycemic drugs: Secondary | ICD-10-CM | POA: Insufficient documentation

## 2023-07-19 DIAGNOSIS — R202 Paresthesia of skin: Secondary | ICD-10-CM

## 2023-07-19 DIAGNOSIS — Z8249 Family history of ischemic heart disease and other diseases of the circulatory system: Secondary | ICD-10-CM | POA: Diagnosis not present

## 2023-07-19 DIAGNOSIS — J439 Emphysema, unspecified: Secondary | ICD-10-CM | POA: Diagnosis not present

## 2023-07-19 DIAGNOSIS — Z4502 Encounter for adjustment and management of automatic implantable cardiac defibrillator: Secondary | ICD-10-CM | POA: Insufficient documentation

## 2023-07-19 DIAGNOSIS — R200 Anesthesia of skin: Secondary | ICD-10-CM | POA: Diagnosis not present

## 2023-07-19 DIAGNOSIS — Z8673 Personal history of transient ischemic attack (TIA), and cerebral infarction without residual deficits: Secondary | ICD-10-CM | POA: Diagnosis not present

## 2023-07-19 DIAGNOSIS — R079 Chest pain, unspecified: Secondary | ICD-10-CM | POA: Diagnosis not present

## 2023-07-19 DIAGNOSIS — Z87891 Personal history of nicotine dependence: Secondary | ICD-10-CM | POA: Insufficient documentation

## 2023-07-19 DIAGNOSIS — R0789 Other chest pain: Secondary | ICD-10-CM | POA: Diagnosis not present

## 2023-07-19 DIAGNOSIS — I447 Left bundle-branch block, unspecified: Secondary | ICD-10-CM | POA: Insufficient documentation

## 2023-07-19 DIAGNOSIS — I428 Other cardiomyopathies: Secondary | ICD-10-CM | POA: Insufficient documentation

## 2023-07-19 DIAGNOSIS — I69392 Facial weakness following cerebral infarction: Secondary | ICD-10-CM

## 2023-07-19 DIAGNOSIS — E039 Hypothyroidism, unspecified: Secondary | ICD-10-CM | POA: Insufficient documentation

## 2023-07-19 DIAGNOSIS — J449 Chronic obstructive pulmonary disease, unspecified: Secondary | ICD-10-CM

## 2023-07-19 LAB — BASIC METABOLIC PANEL
Anion gap: 10 (ref 5–15)
BUN: 47 mg/dL — ABNORMAL HIGH (ref 8–23)
CO2: 23 mmol/L (ref 22–32)
Calcium: 8.8 mg/dL — ABNORMAL LOW (ref 8.9–10.3)
Chloride: 105 mmol/L (ref 98–111)
Creatinine, Ser: 1.5 mg/dL — ABNORMAL HIGH (ref 0.44–1.00)
GFR, Estimated: 38 mL/min — ABNORMAL LOW (ref >60)
Glucose, Bld: 107 mg/dL — ABNORMAL HIGH (ref 70–99)
Potassium: 4.3 mmol/L (ref 3.5–5.1)
Sodium: 138 mmol/L (ref 135–145)

## 2023-07-19 LAB — LIPID PANEL
Cholesterol: 119 mg/dL (ref 0–200)
HDL: 46 mg/dL (ref >40)
LDL Cholesterol: 55 mg/dL (ref 0–99)
Total CHOL/HDL Ratio: 2.6 {ratio}
Triglycerides: 92 mg/dL (ref <150)
VLDL: 18 mg/dL (ref 0–40)

## 2023-07-19 LAB — BRAIN NATRIURETIC PEPTIDE: B Natriuretic Peptide: 38.2 pg/mL (ref 0.0–100.0)

## 2023-07-19 MED ORDER — ENTRESTO 24-26 MG PO TABS: ORAL_TABLET | ORAL | 3 refills | Status: DC

## 2023-07-19 NOTE — Progress Notes (Signed)
ReDS Vest / Clip - 07/19/23 1500       ReDS Vest / Clip   Station Marker A    Ruler Value 25    ReDS Value Range Low volume    ReDS Actual Value 23

## 2023-07-19 NOTE — Patient Instructions (Addendum)
Hold Stacey Hartman, and [name] for 2 days. Decrease Entresto to 12/13 mg (1/2 pill) twice daily. Labs today - will call you if abnormal. Referral sent to Neurology - see below. Lexiscan ordered for chest pain evaluation - see below.  Return to see Dr. Shirlee Latch in 4 months. Please call us at [phone] in February to schedule this appointment. Please call us at [phone] if any questions or concerns prior to next visit.

## 2023-07-20 ENCOUNTER — Telehealth: Payer: Self-pay | Admitting: *Deleted

## 2023-07-20 ENCOUNTER — Telehealth (HOSPITAL_COMMUNITY): Payer: Self-pay

## 2023-07-20 DIAGNOSIS — I5022 Chronic systolic (congestive) heart failure: Secondary | ICD-10-CM

## 2023-07-20 NOTE — Telephone Encounter (Signed)
-----   Message from Jacklynn Ganong sent at 07/20/2023  8:40 AM EST ----- Renal function elevated. No change to plan discussed at visit. Please repeat BMET in 7-10 days to follow renal function.

## 2023-07-20 NOTE — Telephone Encounter (Signed)
Spoke with patient regarding the following results. Patient made aware and patient verbalized understanding.   Patient scheduled for labs next Wednesday 11/27 at 12:15pm   Lab orders placed.

## 2023-07-27 ENCOUNTER — Other Ambulatory Visit (HOSPITAL_COMMUNITY): Payer: 59

## 2023-08-01 ENCOUNTER — Telehealth (HOSPITAL_COMMUNITY): Payer: Self-pay

## 2023-08-01 NOTE — Telephone Encounter (Signed)
Detailed instructions left on the patient's answering machine. Asked to call back with any questions. S.Aby Gessel CCT

## 2023-08-03 ENCOUNTER — Other Ambulatory Visit (HOSPITAL_COMMUNITY): Payer: 59

## 2023-08-03 ENCOUNTER — Encounter: Payer: Self-pay | Admitting: Cardiology

## 2023-08-05 ENCOUNTER — Encounter: Payer: Self-pay | Admitting: Neurology

## 2023-08-08 ENCOUNTER — Ambulatory Visit (HOSPITAL_COMMUNITY): Payer: 59

## 2023-08-15 ENCOUNTER — Telehealth (HOSPITAL_COMMUNITY): Payer: Self-pay | Admitting: Family Medicine

## 2023-08-15 NOTE — Telephone Encounter (Signed)
Patient called and cancelled Myoview for the reason below:  08/08/2023 8:16 AM [id number], Stacey Hartman  Cancel Rsn: Patient (sick   Order will be removed from the WQ and if patient calls to rescheduled we will reinstate the order. Thank you.

## 2023-09-19 ENCOUNTER — Ambulatory Visit (INDEPENDENT_AMBULATORY_CARE_PROVIDER_SITE_OTHER): Payer: 59

## 2023-09-19 ENCOUNTER — Other Ambulatory Visit (HOSPITAL_COMMUNITY): Payer: Self-pay | Admitting: *Deleted

## 2023-09-19 DIAGNOSIS — I447 Left bundle-branch block, unspecified: Secondary | ICD-10-CM | POA: Diagnosis not present

## 2023-09-19 DIAGNOSIS — R079 Chest pain, unspecified: Secondary | ICD-10-CM

## 2023-09-19 LAB — CUP PACEART REMOTE DEVICE CHECK
Battery Remaining Longevity: 54 mo
Battery Remaining Percentage: 78 %
Battery Voltage: 2.98 V
Brady Statistic AP VP Percent: 9 %
Brady Statistic AP VS Percent: 1 %
Brady Statistic AS VP Percent: 91 %
Brady Statistic AS VS Percent: 1 %
Brady Statistic RA Percent Paced: 8.9 %
Date Time Interrogation Session: 20250120020026
HighPow Impedance: 57 Ohm
HighPow Impedance: 57 Ohm
Implantable Lead Connection Status: 753985
Implantable Lead Connection Status: 753985
Implantable Lead Connection Status: 753985
Implantable Lead Implant Date: 20231023
Implantable Lead Implant Date: 20231023
Implantable Lead Implant Date: 20231023
Implantable Lead Location: 753858
Implantable Lead Location: 753859
Implantable Lead Location: 753860
Implantable Lead Model: 7122
Implantable Pulse Generator Implant Date: 20231023
Lead Channel Impedance Value: 1050 Ohm
Lead Channel Impedance Value: 400 Ohm
Lead Channel Impedance Value: 480 Ohm
Lead Channel Pacing Threshold Amplitude: 0.5 V
Lead Channel Pacing Threshold Amplitude: 0.75 V
Lead Channel Pacing Threshold Amplitude: 2.5 V
Lead Channel Pacing Threshold Pulse Width: 0.5 ms
Lead Channel Pacing Threshold Pulse Width: 0.5 ms
Lead Channel Pacing Threshold Pulse Width: 1 ms
Lead Channel Sensing Intrinsic Amplitude: 11.3 mV
Lead Channel Sensing Intrinsic Amplitude: 2.2 mV
Lead Channel Setting Pacing Amplitude: 2 V
Lead Channel Setting Pacing Amplitude: 2 V
Lead Channel Setting Pacing Amplitude: 3.25 V
Lead Channel Setting Pacing Pulse Width: 0.5 ms
Lead Channel Setting Pacing Pulse Width: 1 ms
Lead Channel Setting Sensing Sensitivity: 0.5 mV
Pulse Gen Serial Number: 8949490
Zone Setting Status: 755011

## 2023-09-26 ENCOUNTER — Ambulatory Visit: Payer: 59 | Admitting: Neurology

## 2023-10-06 ENCOUNTER — Other Ambulatory Visit (HOSPITAL_COMMUNITY): Payer: Self-pay | Admitting: Cardiology

## 2023-10-26 ENCOUNTER — Encounter: Payer: Self-pay | Admitting: Internal Medicine

## 2023-10-28 NOTE — Progress Notes (Signed)
 Remote ICD transmission.

## 2023-11-15 ENCOUNTER — Ambulatory Visit: Payer: 59 | Admitting: Neurology

## 2023-11-28 ENCOUNTER — Other Ambulatory Visit (HOSPITAL_COMMUNITY): Payer: Self-pay

## 2023-11-28 ENCOUNTER — Telehealth (HOSPITAL_COMMUNITY): Payer: Self-pay

## 2023-11-28 NOTE — Telephone Encounter (Signed)
 Advanced Heart Failure Patient Advocate Encounter  Received notification from [facility] that prior authorization is required for Dapagliflozin. This plan prefers brand name (DAW [id number]).  Test billing for Farxiga 5 mg shows copay of $0 for 90 days. Test billing for Farxiga 10 mg returns refill too soon rejection. Pharmacy has filled on 11/28/23. No prior authorization submitted at this time.  Burnell Blanks, CPhT Rx Patient Advocate Phone: [phone]

## 2023-12-05 ENCOUNTER — Ambulatory Visit: Admitting: Neurology

## 2023-12-19 ENCOUNTER — Ambulatory Visit (INDEPENDENT_AMBULATORY_CARE_PROVIDER_SITE_OTHER): Payer: 59

## 2023-12-19 DIAGNOSIS — I447 Left bundle-branch block, unspecified: Secondary | ICD-10-CM

## 2023-12-20 LAB — CUP PACEART REMOTE DEVICE CHECK
Battery Remaining Longevity: 52 mo
Battery Remaining Percentage: 74 %
Battery Voltage: 2.98 V
Brady Statistic AP VP Percent: 7.7 %
Brady Statistic AP VS Percent: 1 %
Brady Statistic AS VP Percent: 92 %
Brady Statistic AS VS Percent: 1 %
Brady Statistic RA Percent Paced: 7.6 %
Date Time Interrogation Session: 20250421020021
HighPow Impedance: 60 Ohm
HighPow Impedance: 60 Ohm
Implantable Lead Connection Status: 753985
Implantable Lead Connection Status: 753985
Implantable Lead Connection Status: 753985
Implantable Lead Implant Date: 20231023
Implantable Lead Implant Date: 20231023
Implantable Lead Implant Date: 20231023
Implantable Lead Location: 753858
Implantable Lead Location: 753859
Implantable Lead Location: 753860
Implantable Lead Model: 7122
Implantable Pulse Generator Implant Date: 20231023
Lead Channel Impedance Value: 1025 Ohm
Lead Channel Impedance Value: 400 Ohm
Lead Channel Impedance Value: 530 Ohm
Lead Channel Pacing Threshold Amplitude: 0.5 V
Lead Channel Pacing Threshold Amplitude: 0.625 V
Lead Channel Pacing Threshold Amplitude: 2.5 V
Lead Channel Pacing Threshold Pulse Width: 0.5 ms
Lead Channel Pacing Threshold Pulse Width: 0.5 ms
Lead Channel Pacing Threshold Pulse Width: 1 ms
Lead Channel Sensing Intrinsic Amplitude: 0.5 mV
Lead Channel Sensing Intrinsic Amplitude: 11.3 mV
Lead Channel Setting Pacing Amplitude: 2 V
Lead Channel Setting Pacing Amplitude: 2 V
Lead Channel Setting Pacing Amplitude: 3.25 V
Lead Channel Setting Pacing Pulse Width: 0.5 ms
Lead Channel Setting Pacing Pulse Width: 1 ms
Lead Channel Setting Sensing Sensitivity: 0.5 mV
Pulse Gen Serial Number: 8949490
Zone Setting Status: 755011

## 2024-01-11 ENCOUNTER — Other Ambulatory Visit (HOSPITAL_COMMUNITY): Payer: Self-pay | Admitting: Cardiology

## 2024-01-12 ENCOUNTER — Telehealth (HOSPITAL_COMMUNITY): Payer: Self-pay | Admitting: Cardiology

## 2024-01-12 NOTE — Telephone Encounter (Signed)
 Front office received a message from [place], [place] that this patient was due for a f/u appt with Dr. Mitzie Anda in March 2025. Front office called pt and instructed pt to give a return call to Dr. Montez Ape office to get scheduled.

## 2024-01-17 ENCOUNTER — Ambulatory Visit: Admitting: Neurology

## 2024-01-17 ENCOUNTER — Encounter: Payer: Self-pay | Admitting: Neurology

## 2024-02-03 ENCOUNTER — Telehealth (HOSPITAL_COMMUNITY): Payer: Self-pay | Admitting: Cardiology

## 2024-02-08 NOTE — Addendum Note (Signed)
 Addended by: Edra Govern D on: 02/08/2024 01:24 PM   Modules accepted: Orders

## 2024-02-08 NOTE — Progress Notes (Signed)
 Remote ICD transmission.

## 2024-02-27 ENCOUNTER — Other Ambulatory Visit (HOSPITAL_COMMUNITY): Payer: Self-pay | Admitting: Cardiology

## 2024-03-16 ENCOUNTER — Encounter (HOSPITAL_COMMUNITY): Admitting: Cardiology

## 2024-03-19 ENCOUNTER — Ambulatory Visit: Payer: 59

## 2024-03-19 DIAGNOSIS — I447 Left bundle-branch block, unspecified: Secondary | ICD-10-CM

## 2024-03-20 ENCOUNTER — Telehealth (HOSPITAL_COMMUNITY): Payer: Self-pay | Admitting: Cardiology

## 2024-03-20 LAB — CUP PACEART REMOTE DEVICE CHECK
Battery Remaining Longevity: 50 mo
Battery Remaining Percentage: 71 %
Battery Voltage: 2.98 V
Brady Statistic AP VP Percent: 7.3 %
Brady Statistic AP VS Percent: 1 %
Brady Statistic AS VP Percent: 93 %
Brady Statistic AS VS Percent: 1 %
Brady Statistic RA Percent Paced: 7.2 %
Date Time Interrogation Session: 20250721020018
HighPow Impedance: 61 Ohm
HighPow Impedance: 61 Ohm
Implantable Lead Connection Status: 753985
Implantable Lead Connection Status: 753985
Implantable Lead Connection Status: 753985
Implantable Lead Implant Date: 20231023
Implantable Lead Implant Date: 20231023
Implantable Lead Implant Date: 20231023
Implantable Lead Location: 753858
Implantable Lead Location: 753859
Implantable Lead Location: 753860
Implantable Lead Model: 7122
Implantable Pulse Generator Implant Date: 20231023
Lead Channel Impedance Value: 1200 Ohm
Lead Channel Impedance Value: 390 Ohm
Lead Channel Impedance Value: 480 Ohm
Lead Channel Pacing Threshold Amplitude: 0.5 V
Lead Channel Pacing Threshold Amplitude: 0.75 V
Lead Channel Pacing Threshold Amplitude: 2.5 V
Lead Channel Pacing Threshold Pulse Width: 0.5 ms
Lead Channel Pacing Threshold Pulse Width: 0.5 ms
Lead Channel Pacing Threshold Pulse Width: 1 ms
Lead Channel Sensing Intrinsic Amplitude: 11.3 mV
Lead Channel Sensing Intrinsic Amplitude: 2.5 mV
Lead Channel Setting Pacing Amplitude: 2 V
Lead Channel Setting Pacing Amplitude: 2 V
Lead Channel Setting Pacing Amplitude: 3.25 V
Lead Channel Setting Pacing Pulse Width: 0.5 ms
Lead Channel Setting Pacing Pulse Width: 1 ms
Lead Channel Setting Sensing Sensitivity: 0.5 mV
Pulse Gen Serial Number: 8949490
Zone Setting Status: 755011

## 2024-03-20 NOTE — Telephone Encounter (Signed)
 Called to confirm/remind patient of their appointment at the [facility] on 03/20/2024.   Appointment:   [] Confirmed  [x] Left mess   [] No answer/No voice mail  [] VM Full/unable to leave message  [] Phone not in service  Patient reminded to bring all medications and/or complete list.  Confirmed patient has transportation. Gave directions, instructed to utilize valet parking.

## 2024-03-21 ENCOUNTER — Inpatient Hospital Stay (HOSPITAL_COMMUNITY): Admission: RE | Admit: 2024-03-21 | Source: Ambulatory Visit | Admitting: Cardiology

## 2024-03-21 ENCOUNTER — Encounter (HOSPITAL_COMMUNITY): Payer: Self-pay

## 2024-03-22 ENCOUNTER — Ambulatory Visit: Payer: Self-pay | Admitting: Cardiology

## 2024-04-11 ENCOUNTER — Encounter (HOSPITAL_COMMUNITY): Payer: Self-pay

## 2024-04-11 ENCOUNTER — Telehealth (HOSPITAL_COMMUNITY): Payer: Self-pay | Admitting: Cardiology

## 2024-04-11 ENCOUNTER — Encounter (HOSPITAL_COMMUNITY): Admitting: Cardiology

## 2024-04-15 ENCOUNTER — Other Ambulatory Visit (HOSPITAL_COMMUNITY): Payer: Self-pay | Admitting: Cardiology

## 2024-05-03 ENCOUNTER — Encounter: Payer: Self-pay | Admitting: Internal Medicine

## 2024-05-11 ENCOUNTER — Other Ambulatory Visit (HOSPITAL_COMMUNITY): Payer: Self-pay | Admitting: Cardiology

## 2024-05-24 ENCOUNTER — Ambulatory Visit: Attending: Cardiology | Admitting: Physician Assistant

## 2024-05-24 NOTE — Progress Notes (Deleted)
 Cardiology Office Note Date:  05/24/2024  Patient ID:  Stacey Hartman, Stacey Hartman 26-Feb-1956, MRN [id number] PCP:  Pecolia Senior, MD  Cardiologist:  Dr. Rolan Electrophysiologist: Dr. Fernande    Chief Complaint:   *** annual device visit  History of Present Illness: Stacey Hartman is a 68 y.o. female with history of  COPD (former smoker), stroke, Raynaud's, stroke, hypothyroid chronic CHF (systolic), hypothyroidism, LBBB,  ICD  --Referred to AHF team for DOE, Echo 2/23 EF < 20% RV normal.   --Sent to ED from cardiology office after echo showed EF < 20%. With c/o CP and SOB in waiting room/triage and taken for urgent R/LHC.  --Cardiac cath with no significant CAD, normal PA and PCWP, LVEDP 28 mmHg, CO preserved.   --cMRI with LVEF 12%, no myocardial LGE, elevated T1 indices, ECV 42% in basal septum.  Developed acute left sided facial numbness during cMRI. Code stroke called.  --MRI brain with punctate acute infarct in high right frontal cortex.  No AF detected during admit.  --30-day monitor arranged to evaluate for arrhythmias, showed no AF either.  --PYP obtained and equivocal for transthyretin amyloidosis (grade 1, H/CL 1.47).  -- TTR gene testing negative -- Echo 5/23, EF remained < 20%, moderate LV dilation, normal RV, IVC normal   Followed by Dr. Selwyn team as well as Dr. Jacklynn team over the years As well as GI for reports of unintentional weight loss   Last w/Dr. Fernande on 05/11/23, LVEF w/improvement to 40-45%, remained with some DOE though improved EKG opt undertaken at this visit Weight loss had slowed/stabilized  She saw th AHF team 07/19/23, a number of symptoms CP last week while watching TV, associated with lip and L arm numbness. Pain resolved spontaneously, lasted 3-4 hours, but she still has facial numbness. She also has a facial tic she attributes to being on Entresto . She is dizzy when she moves too fast.  Device suggested low volume status Some meds paused  for 2 days, entresto  dose reduced Neuro referral Ischemic eval  Lexiscan stress test ordered, not completed  TODAY  *** device only *** volume *** HF team, due *** LV lead capture?, threshold/output   Device information Abbott CRT-D implanted 06/21/22 LV lead in in [place]  Past Medical History:  Diagnosis Date   Anxiety    Chronic systolic CHF (congestive heart failure), NYHA class 3 (HCC)    Fibromyalgia    Hypothyroid    LBBB (left bundle branch block)    NICM (nonischemic cardiomyopathy) (HCC)    Thyroid disease    Weight loss, non-intentional     Past Surgical History:  Procedure Laterality Date   ANKLE SURGERY Right    BIV ICD INSERTION CRT-D N/A 06/21/2022   Procedure: BIV ICD INSERTION CRT-D;  Surgeon: Fernande Elspeth BROCKS, MD;  Location: [facility] INVASIVE CV LAB;  Service: Cardiovascular;  Laterality: N/A;   CHOLECYSTECTOMY     FLEXIBLE SIGMOIDOSCOPY N/A 05/24/2022   Procedure: FLEXIBLE SIGMOIDOSCOPY;  Surgeon: Legrand Victory LITTIE DOUGLAS, MD;  Location: [facility] ENDOSCOPY;  Service: Gastroenterology;  Laterality: N/A;  NO SEDATION   RIGHT/LEFT HEART CATH AND CORONARY ANGIOGRAPHY N/A 10/05/2021   Procedure: RIGHT/LEFT HEART CATH AND CORONARY ANGIOGRAPHY;  Surgeon: Rolan Ezra RAMAN, MD;  Location: [facility] INVASIVE CV LAB;  Service: Cardiovascular;  Laterality: N/A;   TUBAL LIGATION      Current Outpatient Medications  Medication Sig Dispense Refill   atorvastatin  (LIPITOR) 40 MG tablet TAKE ONE TABLET BY MOUTH EVERY DAY  30 tablet 1   carboxymethylcellulose (REFRESH TEARS) 0.5 % SOLN Place 1 drop into both eyes 3 (three) times daily as needed (dry eyes).     clonazePAM (KLONOPIN) 0.5 MG tablet Take 0.5 mg by mouth 2 (two) times daily.     clopidogrel  (PLAVIX ) 75 MG tablet TAKE ONE TABLET BY MOUTH EVERY DAY 30 tablet 1   dapagliflozin  propanediol (FARXIGA ) 10 MG TABS tablet Take 1 tablet (10 mg total) by mouth daily. PLEASE SCHEDULE APPOINTMENT FOR FUTURE REFILLS 90 tablet 0   esomeprazole  (NEXIUM) 20 MG capsule Take 20 mg by mouth daily as needed.     gabapentin  (NEURONTIN ) 300 MG capsule Take 300 mg by mouth at bedtime.     levothyroxine  (SYNTHROID ) 150 MCG tablet TAKE (1) TABLET BY MOUTH DAILY BEFORE BREAKFAST. 30 tablet 12   metoprolol  succinate (TOPROL -XL) 50 MG 24 hr tablet TAKE ONE TABLET BY MOUTH AT BEDTIME (TAKE WITH OR IMMEDIATELY FOLLOWING A MEAL) 30 tablet 1   sacubitril-valsartan (ENTRESTO ) 24-26 MG Take 1/2 tablet twice daily 90 tablet 3   spironolactone  (ALDACTONE ) 25 MG tablet TAKE ONE TABLET BY MOUTH EVERY DAY 30 tablet 1   No current facility-administered medications for this visit.    Allergies:   Penicillins and Sulfa antibiotics   Social History:  The patient  reports that she quit smoking about 23 years ago. Her smoking use included cigarettes. She started smoking about 48 years ago. She has a 12.5 pack-year smoking history. She has been exposed to tobacco smoke. She has never used smokeless tobacco. She reports that she does not currently use alcohol. She reports that she does not use drugs.   Family History:  The patient's family history includes Colon cancer in her maternal grandmother; Healthy in her son; Heart attack in her father; Heart disease in her father; Heart failure in her brother; Melanoma in her brother, mother, and sister; Stomach cancer in her maternal uncle.  ROS:  Please see the history of present illness.    All other systems are reviewed and otherwise negative.   PHYSICAL EXAM:  VS:  LMP  (LMP Unknown)  BMI: There is no height or weight on file to calculate BMI. Well nourished, well developed, in no acute distress HEENT: normocephalic, atraumatic Neck: no JVD, carotid bruits or masses Cardiac: *** RRR; no significant murmurs, no rubs, or gallops Lungs: *** CTA b/l, no wheezing, rhonchi or rales Abd: soft, nontender MS: no deformity, marked, advanced atrophy Ext: *** no edema Skin: warm and dry, no rash Neuro:  No gross deficits  appreciated Psych: euthymic mood, full affect  *** ICD site: no erythema, edema, tenderness or tethering, no skin thinning  EKG:  done 07/19/23 SR/V paced, QRS  Device interrogation done today and reviewed by myself *** Battery, lead measurements are good ***  04/19/23: TTE  1. Left ventricular ejection fraction, by estimation, is 40 to 45%. The  left ventricle has mildly decreased function. The left ventricle  demonstrates global hypokinesis. Left ventricular diastolic parameters are  consistent with Grade I diastolic  dysfunction (impaired relaxation). The average left ventricular global  longitudinal strain is -16.5 %. The global longitudinal strain is  abnormal.   2. Right ventricular systolic function is normal. The right ventricular  size is normal. There is normal pulmonary artery systolic pressure. The  estimated right ventricular systolic pressure is 21.5 mmHg.   3. The mitral valve is normal in structure. No evidence of mitral valve  regurgitation. No evidence of mitral  stenosis.   4. Tricuspid valve regurgitation is mild to moderate.   5. The aortic valve is tricuspid. Aortic valve regurgitation is not  visualized. No aortic stenosis is present.   6. Aortic dilatation noted. There is mild dilatation of the aortic root,  measuring 39 mm.   7. The inferior vena cava is normal in size with greater than 50%  respiratory variability, suggesting right atrial pressure of 3 mmHg.   8. A small pericardial effusion is present.   01/26/22: TTE 1. Left ventricular ejection fraction, by estimation, is <20%. The left  ventricle has severely decreased function. The left ventricle demonstrates  global hypokinesis. The left ventricular internal cavity size was  moderately dilated. Left ventricular  diastolic parameters are consistent with Grade I diastolic dysfunction  (impaired relaxation). The average left ventricular global longitudinal  strain is -10.2 %. The global  longitudinal strain is abnormal.   2. Right ventricular systolic function is normal. The right ventricular  size is normal. Tricuspid regurgitation signal is inadequate for assessing  PA pressure.   3. Left atrial size was mildly dilated.   4. A small pericardial effusion is present.   5. The mitral valve is normal in structure. Trivial mitral valve  regurgitation. No evidence of mitral stenosis.   6. The aortic valve is tricuspid. Aortic valve regurgitation is not  visualized. No aortic stenosis is present.   7. The inferior vena cava is normal in size with greater than 50%  respiratory variability, suggesting right atrial pressure of 3 mmHg.    10/05/21: R/LHC 1. Normal RA pressure and PCWP though LVEDP was elevated.  PCWP was repeated and remained low.  2. Preserved cardiac output.  3. Normal PA pressure.  4. No significant coronary disease.   Recent Labs: 07/19/2023: B Natriuretic Peptide 38.2; BUN 47; Creatinine, Ser 1.50; Potassium 4.3; Sodium 138  07/19/2023: Cholesterol 119; HDL 46; LDL Cholesterol 55; Total CHOL/HDL Ratio 2.6; Triglycerides 92; VLDL 18   CrCl cannot be calculated (Patient's most recent lab result is older than the maximum 21 days allowed.).   Wt Readings from Last 3 Encounters:  07/19/23 109 lb 6.4 oz (49.6 kg)  05/11/23 110 lb (49.9 kg)  04/19/23 109 lb (49.4 kg)     Other studies reviewed: Additional studies/records reviewed today include: summarized above  ASSESSMENT AND PLAN:  NICM LBBB Chronic CHF (systolic) LVEF improved to 40-45% by echo 2024 *** She does not appear volume OL ***  C/w Dr. Marciano  4. ICD ***intact function *** no programming changes madae     Disposition: ***, sooner if needed.  Current medicines are reviewed at length with the patient today.  The patient did not have any concerns regarding medicines.  Bonney Charlies Arthur, PA-C 05/24/2024 7:36 AM     [facility] [address] [place] [address] [phone] (office)  [phone] (fax)

## 2024-05-25 ENCOUNTER — Encounter: Payer: Self-pay | Admitting: Physician Assistant

## 2024-06-04 NOTE — Progress Notes (Signed)
 Remote ICD Transmission

## 2024-06-10 ENCOUNTER — Other Ambulatory Visit (HOSPITAL_COMMUNITY): Payer: Self-pay | Admitting: Cardiology

## 2024-06-18 ENCOUNTER — Ambulatory Visit (INDEPENDENT_AMBULATORY_CARE_PROVIDER_SITE_OTHER): Payer: 59

## 2024-06-18 DIAGNOSIS — I447 Left bundle-branch block, unspecified: Secondary | ICD-10-CM

## 2024-06-20 LAB — CUP PACEART REMOTE DEVICE CHECK
Battery Remaining Longevity: 49 mo
Battery Remaining Percentage: 68 %
Battery Voltage: 2.96 V
Brady Statistic AP VP Percent: 7 %
Brady Statistic AP VS Percent: 1 %
Brady Statistic AS VP Percent: 93 %
Brady Statistic AS VS Percent: 1 %
Brady Statistic RA Percent Paced: 7 %
Date Time Interrogation Session: 20251020140525
HighPow Impedance: 62 Ohm
HighPow Impedance: 62 Ohm
Implantable Lead Connection Status: 753985
Implantable Lead Connection Status: 753985
Implantable Lead Connection Status: 753985
Implantable Lead Implant Date: 20231023
Implantable Lead Implant Date: 20231023
Implantable Lead Implant Date: 20231023
Implantable Lead Location: 753858
Implantable Lead Location: 753859
Implantable Lead Location: 753860
Implantable Lead Model: 7122
Implantable Pulse Generator Implant Date: 20231023
Lead Channel Impedance Value: 1200 Ohm
Lead Channel Impedance Value: 380 Ohm
Lead Channel Impedance Value: 540 Ohm
Lead Channel Pacing Threshold Amplitude: 0.5 V
Lead Channel Pacing Threshold Amplitude: 0.75 V
Lead Channel Pacing Threshold Amplitude: 2.5 V
Lead Channel Pacing Threshold Pulse Width: 0.5 ms
Lead Channel Pacing Threshold Pulse Width: 0.5 ms
Lead Channel Pacing Threshold Pulse Width: 1 ms
Lead Channel Sensing Intrinsic Amplitude: 11.3 mV
Lead Channel Sensing Intrinsic Amplitude: 2.7 mV
Lead Channel Setting Pacing Amplitude: 2 V
Lead Channel Setting Pacing Amplitude: 2 V
Lead Channel Setting Pacing Amplitude: 3.25 V
Lead Channel Setting Pacing Pulse Width: 0.5 ms
Lead Channel Setting Pacing Pulse Width: 1 ms
Lead Channel Setting Sensing Sensitivity: 0.5 mV
Pulse Gen Serial Number: 8949490
Zone Setting Status: 755011

## 2024-06-22 ENCOUNTER — Other Ambulatory Visit (HOSPITAL_COMMUNITY): Payer: Self-pay | Admitting: Cardiology

## 2024-06-22 NOTE — Progress Notes (Signed)
 Remote ICD Transmission

## 2024-06-25 ENCOUNTER — Ambulatory Visit: Payer: Self-pay | Admitting: Cardiology

## 2024-06-29 ENCOUNTER — Telehealth: Payer: Self-pay

## 2024-06-29 NOTE — Telephone Encounter (Signed)
 Pt called in stating that she feels her wires through her skin and was concerned. Pt is going to send a email with a pic so that the nurses can review

## 2024-06-29 NOTE — Telephone Encounter (Signed)
 Picture received.  Does not appear that Pt's pacemaker or wires have broken through the skin.  Returned call to Pt.  She states she thought she could wait until her scheduled follow up appointment but she was just concerned.  Advised to continue to monitor and if her device or wires did come through the skin she should go to the ER.  Directions given to new office for upcoming appointment 07/04/2024.

## 2024-07-03 NOTE — Progress Notes (Deleted)
 Cardiology Office Note Date:  07/03/2024  Patient ID:  Stacey Hartman, Stacey Hartman [birth year], MRN [id number] PCP:  Pecolia Senior, MD  Cardiologist:  Dr. Rolan Electrophysiologist: Dr. Fernande >> Dr. Cindie    Chief Complaint:   *** annual visit  History of Present Illness: Stacey Hartman is a 68 y.o. female with history of  COPD (former smoker), hypothyroidism, stroke chronic CHF (systolic), LBBB, Raynaud's  --Referred to AHF team for DOE, Echo 2/23 EF < 20% RV normal.   --Sent to ED from cardiology office after echo showed EF < 20%. With c/o CP and SOB in waiting room/triage and taken for urgent R/LHC.  --Cardiac cath with no significant CAD, normal PA and PCWP, LVEDP 28 mmHg, CO preserved.   --cMRI with LVEF 12%, no myocardial LGE, elevated T1 indices, ECV 42% in basal septum.  Developed acute left sided facial numbness during cMRI. Code stroke called.  --MRI brain with punctate acute infarct in high right frontal cortex.  No AF detected during admit.  --30-day monitor arranged to evaluate for arrhythmias, showed no AF either.  --PYP obtained and equivocal for transthyretin amyloidosis (grade 1, H/CL 1.47).  TTR gene testing negative  ------------------------ Following with EP and AHF teams over the year ________________________  I saw her 12/10/22 Overall remains generally fatigued, still get winded with exertion, but all of this is improved post CRT implant.  Just not as good as she would like. He real primary concern is that despite a good appetite and eating well without any real restrictions in food choices she continues to lose weight. Her PMD has retired, though did get a call from the office that her TSH done a few months prior was very high, but a recheck last week was wnl. No CP, palpitations or cardiac awareness No rest SOB No near syncope or syncope. LV outputs adjusted to ensure capture  She saw Dr. Fernande 05/10/24 Reported improved exertional capacity Mentions LV   was again subthreshold > Multiple reprogramming's lead to a vector change as she was not LV pacing secondary to threshold.  No QRSd was 96 ms by the computer clearly less than 120 by my eye  Post implant QRSd was 128 ms No ongoing weight loss  Most recently for AHF team 07/19/23 Reported an episode of CP while watching TV, associated with lip and L arm numbness. Pain resolved spontaneously, lasted 3-4 hours, but she still has facial numbness. She also has a facial tic she attributes to being on Entresto . She is dizzy when she moves too fast. She is not SOB walking on flat ground, has SOB with heavy housework.   Suspected dry,  held Entresto , spiro and Farxiga  for a couple days > to resume afterwards Planned for stress test and 4 mo f/u  No stress test or f/u since then  Recently called with concerns of appearance of her pocket, ? Concerns of erosion > picture did not appear like it  TODAY  *** volume *** capture? *** symptoms *** pocket erosion   Device information Abbott CRT-D implanted 06/21/22 LV lead in in CS  Past Medical History:  Diagnosis Date   Anxiety    Chronic systolic CHF (congestive heart failure), NYHA class 3 (HCC)    Fibromyalgia    Hypothyroid    LBBB (left bundle branch block)    NICM (nonischemic cardiomyopathy) (HCC)    Thyroid disease    Weight loss, non-intentional     Past Surgical History:  Procedure Laterality  Date   ANKLE SURGERY Right    BIV ICD INSERTION CRT-D N/A 06/21/2022   Procedure: BIV ICD INSERTION CRT-D;  Surgeon: Fernande Elspeth BROCKS, MD;  Location: [facility] INVASIVE CV LAB;  Service: Cardiovascular;  Laterality: N/A;   CHOLECYSTECTOMY     FLEXIBLE SIGMOIDOSCOPY N/A 05/24/2022   Procedure: FLEXIBLE SIGMOIDOSCOPY;  Surgeon: Legrand Victory LITTIE DOUGLAS, MD;  Location: [facility] ENDOSCOPY;  Service: Gastroenterology;  Laterality: N/A;  NO SEDATION   RIGHT/LEFT HEART CATH AND CORONARY ANGIOGRAPHY N/A 10/05/2021   Procedure: RIGHT/LEFT HEART CATH AND CORONARY  ANGIOGRAPHY;  Surgeon: Rolan Ezra RAMAN, MD;  Location: [facility] INVASIVE CV LAB;  Service: Cardiovascular;  Laterality: N/A;   TUBAL LIGATION      Current Outpatient Medications  Medication Sig Dispense Refill   atorvastatin  (LIPITOR) 40 MG tablet Take 1 tablet (40 mg total) by mouth daily. PLEASE SCHEDULE APPOINTMENT FOR MORE REFILLS 30 tablet 0   carboxymethylcellulose (REFRESH TEARS) 0.5 % SOLN Place 1 drop into both eyes 3 (three) times daily as needed (dry eyes).     clonazePAM (KLONOPIN) 0.5 MG tablet Take 0.5 mg by mouth 2 (two) times daily.     clopidogrel  (PLAVIX ) 75 MG tablet Take 1 tablet (75 mg total) by mouth daily. PLEASE SCHEDULE APPOINTMENT FOR MORE REFILLS 30 tablet 0   dapagliflozin  propanediol (FARXIGA ) 10 MG TABS tablet Take 1 tablet (10 mg total) by mouth daily. PLEASE SCHEDULE APPOINTMENT FOR FUTURE REFILLS 90 tablet 0   esomeprazole (NEXIUM) 20 MG capsule Take 20 mg by mouth daily as needed.     gabapentin  (NEURONTIN ) 300 MG capsule Take 300 mg by mouth at bedtime.     levothyroxine  (SYNTHROID ) 150 MCG tablet TAKE (1) TABLET BY MOUTH DAILY BEFORE BREAKFAST. 30 tablet 12   metoprolol  succinate (TOPROL -XL) 50 MG 24 hr tablet Take 1 tablet (50 mg total) by mouth daily. PLEASE SCHEDULE APPOINTMENT FOR MORE REFILLS 30 tablet 0   sacubitril-valsartan (ENTRESTO ) 24-26 MG Take 1/2 tablet twice daily 90 tablet 3   spironolactone  (ALDACTONE ) 25 MG tablet Take 1 tablet (25 mg total) by mouth daily. PLEASE SCHEDULE APPOINTMENT FOR MORE REFILLS 30 tablet 0   No current facility-administered medications for this visit.    Allergies:   Penicillins and Sulfa antibiotics   Social History:  The patient  reports that she quit smoking about 23 years ago. Her smoking use included cigarettes. She started smoking about 48 years ago. She has a 12.5 pack-year smoking history. She has been exposed to tobacco smoke. She has never used smokeless tobacco. She reports that she does not currently use  alcohol. She reports that she does not use drugs.   Family History:  The patient's family history includes Colon cancer in her maternal grandmother; Healthy in her son; Heart attack in her father; Heart disease in her father; Heart failure in her brother; Melanoma in her brother, mother, and sister; Stomach cancer in her maternal uncle.  ROS:  Please see the history of present illness.    All other systems are reviewed and otherwise negative.   PHYSICAL EXAM:  VS:  LMP  (LMP Unknown)  BMI: There is no height or weight on file to calculate BMI. Well nourished, well developed, in no acute distress HEENT: normocephalic, atraumatic Neck: no JVD, carotid bruits or masses Cardiac: ***  RRR; no significant murmurs, no rubs, or gallops Lungs: *** CTA b/l, no wheezing, rhonchi or rales Abd: soft, nontender MS: no deformity, marked, advanced atrophy Ext: *** no edema Skin: warm  and dry, no rash Neuro:  No gross deficits appreciated Psych: euthymic mood, full affect  ICD site: no erythema, edema, tenderness or tethering, no skin thinning  EKG:  done today and reviewed by myself  ***  Device interrogation done today and reviewed by myself *** Battery and lead measurements are good ***  04/19/23: TTE 1. Left ventricular ejection fraction, by estimation, is 40 to 45%. The  left ventricle has mildly decreased function. The left ventricle  demonstrates global hypokinesis. Left ventricular diastolic parameters are  consistent with Grade I diastolic  dysfunction (impaired relaxation). The average left ventricular global  longitudinal strain is -16.5 %. The global longitudinal strain is  abnormal.   2. Right ventricular systolic function is normal. The right ventricular  size is normal. There is normal pulmonary artery systolic pressure. The  estimated right ventricular systolic pressure is 21.5 mmHg.   3. The mitral valve is normal in structure. No evidence of mitral valve  regurgitation. No  evidence of mitral stenosis.   4. Tricuspid valve regurgitation is mild to moderate.   5. The aortic valve is tricuspid. Aortic valve regurgitation is not  visualized. No aortic stenosis is present.   6. Aortic dilatation noted. There is mild dilatation of the aortic root,  measuring 39 mm.   7. The inferior vena cava is normal in size with greater than 50%  respiratory variability, suggesting right atrial pressure of 3 mmHg.   8. A small pericardial effusion is present.     01/26/22: TTE 1. Left ventricular ejection fraction, by estimation, is <20%. The left  ventricle has severely decreased function. The left ventricle demonstrates  global hypokinesis. The left ventricular internal cavity size was  moderately dilated. Left ventricular  diastolic parameters are consistent with Grade I diastolic dysfunction  (impaired relaxation). The average left ventricular global longitudinal  strain is -10.2 %. The global longitudinal strain is abnormal.   2. Right ventricular systolic function is normal. The right ventricular  size is normal. Tricuspid regurgitation signal is inadequate for assessing  PA pressure.   3. Left atrial size was mildly dilated.   4. A small pericardial effusion is present.   5. The mitral valve is normal in structure. Trivial mitral valve  regurgitation. No evidence of mitral stenosis.   6. The aortic valve is tricuspid. Aortic valve regurgitation is not  visualized. No aortic stenosis is present.   7. The inferior vena cava is normal in size with greater than 50%  respiratory variability, suggesting right atrial pressure of 3 mmHg.    10/05/21: R/LHC 1. Normal RA pressure and PCWP though LVEDP was elevated.  PCWP was repeated and remained low.  2. Preserved cardiac output.  3. Normal PA pressure.  4. No significant coronary disease.   Recent Labs: 07/19/2023: B Natriuretic Peptide 38.2; BUN 47; Creatinine, Ser 1.50; Potassium 4.3; Sodium 138  07/19/2023:  Cholesterol 119; HDL 46; LDL Cholesterol 55; Total CHOL/HDL Ratio 2.6; Triglycerides 92; VLDL 18   CrCl cannot be calculated (Patient's most recent lab result is older than the maximum 21 days allowed.).   Wt Readings from Last 3 Encounters:  07/19/23 109 lb 6.4 oz (49.6 kg)  05/11/23 110 lb (49.9 kg)  04/19/23 109 lb (49.4 kg)     Other studies reviewed: Additional studies/records reviewed today include: summarized above  ASSESSMENT AND PLAN:  NICM LBBB Chronic CHF (systolic) *** She does not appear volume OL ***  4. ICD *** intact function *** no programming changes made  Disposition: *** sooner if needed.  Current medicines are reviewed at length with the patient today.  The patient did not have any concerns regarding medicines.  Bonney Charlies Arthur, PA-C 07/03/2024 12:49 PM     [facility] [address] [place] [address] [phone] (office)  [phone] (fax)

## 2024-07-04 ENCOUNTER — Ambulatory Visit: Attending: Physician Assistant | Admitting: Physician Assistant

## 2024-07-05 ENCOUNTER — Encounter: Payer: Self-pay | Admitting: Physician Assistant

## 2024-07-05 ENCOUNTER — Other Ambulatory Visit (HOSPITAL_COMMUNITY): Payer: Self-pay | Admitting: Nurse Practitioner

## 2024-07-05 DIAGNOSIS — I888 Other nonspecific lymphadenitis: Secondary | ICD-10-CM

## 2024-07-08 ENCOUNTER — Other Ambulatory Visit (HOSPITAL_COMMUNITY): Payer: Self-pay | Admitting: Cardiology

## 2024-07-10 ENCOUNTER — Ambulatory Visit (HOSPITAL_COMMUNITY)
Admission: RE | Admit: 2024-07-10 | Discharge: 2024-07-10 | Disposition: A | Discharge status: Home / Self Care | Source: Ambulatory Visit | Attending: Nurse Practitioner | Admitting: Nurse Practitioner

## 2024-07-10 DIAGNOSIS — I888 Other nonspecific lymphadenitis: Secondary | ICD-10-CM | POA: Diagnosis present

## 2024-07-19 ENCOUNTER — Other Ambulatory Visit (HOSPITAL_COMMUNITY): Payer: Self-pay | Admitting: Family Medicine

## 2024-07-19 DIAGNOSIS — R079 Chest pain, unspecified: Secondary | ICD-10-CM

## 2024-07-19 DIAGNOSIS — F959 Tic disorder, unspecified: Secondary | ICD-10-CM

## 2024-07-19 DIAGNOSIS — R200 Anesthesia of skin: Secondary | ICD-10-CM

## 2024-07-19 DIAGNOSIS — I5022 Chronic systolic (congestive) heart failure: Secondary | ICD-10-CM

## 2024-08-02 ENCOUNTER — Inpatient Hospital Stay

## 2024-08-02 ENCOUNTER — Inpatient Hospital Stay: Attending: Oncology | Admitting: Oncology

## 2024-08-02 VITALS — BP 113/74 | HR 72 | Temp 97.7°F | Resp 18 | Ht 63.5 in | Wt 112.2 lb

## 2024-08-02 DIAGNOSIS — R634 Abnormal weight loss: Secondary | ICD-10-CM | POA: Diagnosis not present

## 2024-08-02 DIAGNOSIS — R350 Frequency of micturition: Secondary | ICD-10-CM | POA: Insufficient documentation

## 2024-08-02 DIAGNOSIS — Z87891 Personal history of nicotine dependence: Secondary | ICD-10-CM | POA: Diagnosis not present

## 2024-08-02 DIAGNOSIS — Z882 Allergy status to sulfonamides status: Secondary | ICD-10-CM | POA: Diagnosis not present

## 2024-08-02 DIAGNOSIS — Z79890 Hormone replacement therapy (postmenopausal): Secondary | ICD-10-CM | POA: Diagnosis not present

## 2024-08-02 DIAGNOSIS — I700 Atherosclerosis of aorta: Secondary | ICD-10-CM | POA: Diagnosis not present

## 2024-08-02 DIAGNOSIS — Z79899 Other long term (current) drug therapy: Secondary | ICD-10-CM | POA: Diagnosis not present

## 2024-08-02 DIAGNOSIS — Z880 Allergy status to penicillin: Secondary | ICD-10-CM | POA: Diagnosis not present

## 2024-08-02 DIAGNOSIS — R200 Anesthesia of skin: Secondary | ICD-10-CM | POA: Insufficient documentation

## 2024-08-02 DIAGNOSIS — R5383 Other fatigue: Secondary | ICD-10-CM | POA: Diagnosis not present

## 2024-08-02 DIAGNOSIS — R079 Chest pain, unspecified: Secondary | ICD-10-CM | POA: Insufficient documentation

## 2024-08-02 DIAGNOSIS — R110 Nausea: Secondary | ICD-10-CM | POA: Insufficient documentation

## 2024-08-02 DIAGNOSIS — I5022 Chronic systolic (congestive) heart failure: Secondary | ICD-10-CM | POA: Diagnosis not present

## 2024-08-02 DIAGNOSIS — M542 Cervicalgia: Secondary | ICD-10-CM | POA: Diagnosis not present

## 2024-08-02 DIAGNOSIS — R590 Localized enlarged lymph nodes: Secondary | ICD-10-CM | POA: Insufficient documentation

## 2024-08-02 DIAGNOSIS — R630 Anorexia: Secondary | ICD-10-CM | POA: Diagnosis not present

## 2024-08-02 DIAGNOSIS — Z800 Family history of malignant neoplasm of digestive organs: Secondary | ICD-10-CM | POA: Insufficient documentation

## 2024-08-02 DIAGNOSIS — R0602 Shortness of breath: Secondary | ICD-10-CM | POA: Diagnosis not present

## 2024-08-02 DIAGNOSIS — R519 Headache, unspecified: Secondary | ICD-10-CM | POA: Insufficient documentation

## 2024-08-02 DIAGNOSIS — R059 Cough, unspecified: Secondary | ICD-10-CM | POA: Diagnosis not present

## 2024-08-02 DIAGNOSIS — F419 Anxiety disorder, unspecified: Secondary | ICD-10-CM | POA: Insufficient documentation

## 2024-08-02 DIAGNOSIS — Z7722 Contact with and (suspected) exposure to environmental tobacco smoke (acute) (chronic): Secondary | ICD-10-CM | POA: Diagnosis not present

## 2024-08-02 DIAGNOSIS — Z8249 Family history of ischemic heart disease and other diseases of the circulatory system: Secondary | ICD-10-CM | POA: Insufficient documentation

## 2024-08-02 DIAGNOSIS — G479 Sleep disorder, unspecified: Secondary | ICD-10-CM | POA: Insufficient documentation

## 2024-08-02 DIAGNOSIS — Z9049 Acquired absence of other specified parts of digestive tract: Secondary | ICD-10-CM | POA: Diagnosis not present

## 2024-08-02 DIAGNOSIS — K5900 Constipation, unspecified: Secondary | ICD-10-CM | POA: Insufficient documentation

## 2024-08-02 DIAGNOSIS — R42 Dizziness and giddiness: Secondary | ICD-10-CM | POA: Insufficient documentation

## 2024-08-02 DIAGNOSIS — Z808 Family history of malignant neoplasm of other organs or systems: Secondary | ICD-10-CM | POA: Insufficient documentation

## 2024-08-02 LAB — COMPREHENSIVE METABOLIC PANEL WITH GFR
ALT: 16 U/L (ref 0–44)
AST: 28 U/L (ref 15–41)
Albumin: 4.4 g/dL (ref 3.5–5.0)
Alkaline Phosphatase: 101 U/L (ref 38–126)
Anion gap: 13 (ref 5–15)
BUN: 18 mg/dL (ref 8–23)
CO2: 23 mmol/L (ref 22–32)
Calcium: 9.2 mg/dL (ref 8.9–10.3)
Chloride: 102 mmol/L (ref 98–111)
Creatinine, Ser: 0.78 mg/dL (ref 0.44–1.00)
GFR, Estimated: >60 mL/min (ref >60)
Glucose, Bld: 88 mg/dL (ref 70–99)
Potassium: 3.6 mmol/L (ref 3.5–5.1)
Sodium: 138 mmol/L (ref 135–145)
Total Bilirubin: 0.4 mg/dL (ref 0.0–1.2)
Total Protein: 7.5 g/dL (ref 6.5–8.1)

## 2024-08-02 LAB — CBC WITH DIFFERENTIAL/PLATELET
Abs Immature Granulocytes: 0.01 K/uL (ref 0.00–0.07)
Basophils Absolute: 0.1 K/uL (ref 0.0–0.1)
Basophils Relative: 1 %
Eosinophils Absolute: 0.3 K/uL (ref 0.0–0.5)
Eosinophils Relative: 3 %
HCT: 41.3 % (ref 36.0–46.0)
Hemoglobin: 14.1 g/dL (ref 12.0–15.0)
Immature Granulocytes: 0 %
Lymphocytes Relative: 25 %
Lymphs Abs: 2.3 K/uL (ref 0.7–4.0)
MCH: 33.1 pg (ref 26.0–34.0)
MCHC: 34.1 g/dL (ref 30.0–36.0)
MCV: 96.9 fL (ref 80.0–100.0)
Monocytes Absolute: 0.7 K/uL (ref 0.1–1.0)
Monocytes Relative: 7 %
Neutro Abs: 6 K/uL (ref 1.7–7.7)
Neutrophils Relative %: 64 %
Platelets: 170 K/uL (ref 150–400)
RBC: 4.26 MIL/uL (ref 3.87–5.11)
RDW: 12.5 % (ref 11.5–15.5)
WBC: 9.3 K/uL (ref 4.0–10.5)
nRBC: 0 % (ref 0.0–0.2)

## 2024-08-02 LAB — C-REACTIVE PROTEIN: CRP: 0.8 mg/dL (ref <1.0)

## 2024-08-02 LAB — SEDIMENTATION RATE: Sed Rate: 11 mm/h (ref 0–30)

## 2024-08-02 LAB — LACTATE DEHYDROGENASE: LDH: 220 U/L (ref 105–235)

## 2024-08-02 NOTE — Assessment & Plan Note (Addendum)
 Reports intermittent lymphadenopathy for 18 months to 2 years. Most recently had a soft tissue ultrasound of her neck which showed bilateral cervical lymph nodes. Patient had a CT CAP back in 2023 for weight loss and former smoker.  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  Differentials include infectious process, inflammatory process, lymphoproliferative disorder or metastatic disease.  Labs today to check CBC, CMP, LDH, flow cytometry, ESR and CRP levels Most recent ultrasound did not reveal lymph nodes larger than 1 cm so will defer biopsy at this time. Given all of her other symptoms including weight loss and multiple swollen lymph nodes would recommend PET scan. RTC once workup is complete.

## 2024-08-02 NOTE — Progress Notes (Signed)
 [facility] [phone]   [phone]  INITIAL CONSULTATION:  Patient Care Team: Pecolia Senior, MD as PCP - General (Family Medicine) Sheena Pugh, DO as PCP - Cardiology (Cardiology) Fernande Elspeth BROCKS, MD (Inactive) as PCP - Electrophysiology (Cardiology)  CHIEF COMPLAINTS/PURPOSE OF CONSULTATION:  Lymphadenopathy  HISTORY OF PRESENTING ILLNESS:  Stacey Hartman 68 y.o. female with medical history significant for heart failure, hypothyroidism, former tobacco abuse (quit 20 years ago), COPD who presented to her PCP for 1 year worth of neck lymphadenopathy.  Patient has fairly significant heart failure with an EF less than 20%.  She is currently on Entresto .  More recently, patient experienced an episode of chest pain lasting 3 to 4 hours.  A Lexi scan has been ordered by cardiology for evaluation for ischemia.  This has not been performed yet.  Patient was referred by PCP Joen Ee, NP for lymphadenopathy in her neck x 1 year.  Reports ultrasound on 07/10/2024 showed physiologic appearing bilateral cervical lymph nodes.  No mass or lymphadenopathy by ultrasound.  Patient presents today with her husband.  She states over the past several months, she has noticed lymph nodes in her neck which are very painful especially when she turns her head, bilateral axilla, bilateral inguinal and now in her knees.  Reports they flare up more at nighttime.  States the symptoms have been intermittently present for 18 months to 2 years.  She reports appetite of 100% energy levels are 25%.  Has a chronic cough but she is a current non-smoker.  Her husband smokes.  Has constipation, nausea, decreased urinary frequency, dizziness, headaches, numbness, anxiety and sleep problems.   Reports nightly drenching sweats, unintentional weight loss over the past 2 years with a weight as low as 103 pounds.  Current weight is 112.2 pounds.  On exam today she is not  feeling well.  Reports she wants to get to the bottom of her symptoms.  She is very anxious.  MEDICAL HISTORY:  Past Medical History:  Diagnosis Date   Anxiety    Chronic systolic CHF (congestive heart failure), NYHA class 3 (HCC)    Fibromyalgia    Hypothyroid    LBBB (left bundle branch block)    NICM (nonischemic cardiomyopathy) (HCC)    Thyroid  disease    Weight loss, non-intentional     SURGICAL HISTORY: Past Surgical History:  Procedure Laterality Date   ANKLE SURGERY Right    BIV ICD INSERTION CRT-D N/A 06/21/2022   Procedure: BIV ICD INSERTION CRT-D;  Surgeon: Fernande Elspeth BROCKS, MD;  Location: [facility] INVASIVE CV LAB;  Service: Cardiovascular;  Laterality: N/A;   CHOLECYSTECTOMY     FLEXIBLE SIGMOIDOSCOPY N/A 05/24/2022   Procedure: FLEXIBLE SIGMOIDOSCOPY;  Surgeon: Legrand Victory LITTIE DOUGLAS, MD;  Location: [facility] ENDOSCOPY;  Service: Gastroenterology;  Laterality: N/A;  NO SEDATION   RIGHT/LEFT HEART CATH AND CORONARY ANGIOGRAPHY N/A 10/05/2021   Procedure: RIGHT/LEFT HEART CATH AND CORONARY ANGIOGRAPHY;  Surgeon: Rolan Ezra RAMAN, MD;  Location: [facility] INVASIVE CV LAB;  Service: Cardiovascular;  Laterality: N/A;   TUBAL LIGATION      SOCIAL HISTORY: Social History   Socioeconomic History   Marital status: Widowed    Spouse name: Not on file   Number of children: 2   Years of education: Not on file   Highest education level: Not on file  Occupational History   Occupation: disabled  Tobacco Use   Smoking status: Former    Current packs/day: 0.00  Average packs/day: 0.5 packs/day for 25.0 years (12.5 ttl pk-yrs)    Types: Cigarettes    Start date: 78    Quit date: 2002    Years since quitting: 23.9    Passive exposure: Current   Smokeless tobacco: Never  Vaping Use   Vaping status: Never Used  Substance and Sexual Activity   Alcohol use: Not Currently   Drug use: Never   Sexual activity: Not on file  Other Topics Concern   Not on file  Social History Narrative   Not on  file   Social Drivers of Health   Financial Resource Strain: Not on file  Food Insecurity: No Food Insecurity (08/02/2024)   Hunger Vital Sign    Worried About Running Out of Food in the Last Year: Never true    Ran Out of Food in the Last Year: Never true  Transportation Needs: No Transportation Needs (08/02/2024)   PRAPARE - Administrator, Civil Service (Medical): No    Lack of Transportation (Non-Medical): No  Physical Activity: Not on file  Stress: Not on file  Social Connections: Not on file  Intimate Partner Violence: Not At Risk (08/02/2024)   Humiliation, Afraid, Rape, and Kick questionnaire    Fear of Current or Ex-Partner: No    Emotionally Abused: No    Physically Abused: No    Sexually Abused: No    FAMILY HISTORY: Family History  Problem Relation Age of Onset   Melanoma Mother    Heart attack Father    Heart disease Father    Melanoma Sister    Melanoma Brother    Heart failure Brother    Colon cancer Maternal Grandmother    Healthy Son    Stomach cancer Maternal Uncle    Esophageal cancer Neg Hx    Rectal cancer Neg Hx     ALLERGIES:  is allergic to penicillins and sulfa antibiotics.  MEDICATIONS:  Current Outpatient Medications  Medication Sig Dispense Refill   DULoxetine (CYMBALTA) 20 MG capsule Take 20 mg by mouth daily.     atorvastatin  (LIPITOR) 40 MG tablet TAKE ONE TABLET BY MOUTH EVERY DAY 30 tablet 0   carboxymethylcellulose (REFRESH TEARS) 0.5 % SOLN Place 1 drop into both eyes 3 (three) times daily as needed (dry eyes).     clonazePAM (KLONOPIN) 0.5 MG tablet Take 0.5 mg by mouth 2 (two) times daily.     clopidogrel  (PLAVIX ) 75 MG tablet TAKE ONE TABLET BY MOUTH EVERY DAY 30 tablet 0   dapagliflozin  propanediol (FARXIGA ) 10 MG TABS tablet Take 1 tablet (10 mg total) by mouth daily. PLEASE SCHEDULE APPOINTMENT FOR FUTURE REFILLS 90 tablet 0   esomeprazole (NEXIUM) 20 MG capsule Take 20 mg by mouth daily as needed.     gabapentin   (NEURONTIN ) 300 MG capsule Take 300 mg by mouth at bedtime.     levothyroxine  (SYNTHROID ) 150 MCG tablet TAKE (1) TABLET BY MOUTH DAILY BEFORE BREAKFAST. 30 tablet 12   metoprolol  succinate (TOPROL -XL) 50 MG 24 hr tablet TAKE ONE TABLET BY MOUTH AT BEDTIME (TAKE WITH OR IMMEDIATELY FOLLOWING A MEAL) 30 tablet 0   sacubitril-valsartan (ENTRESTO ) 24-26 MG TAKE 1/2 TABLET BY MOUTH TWICE DAILY 90 tablet 3   spironolactone  (ALDACTONE ) 25 MG tablet TAKE ONE TABLET BY MOUTH EVERY DAY 30 tablet 0   No current facility-administered medications for this visit.    REVIEW OF SYSTEMS:   Review of Systems  Constitutional:  Positive for malaise/fatigue and weight loss.  Respiratory:  Positive for cough.   Cardiovascular:  Positive for palpitations.  Gastrointestinal:  Positive for constipation and nausea.  Genitourinary:  Positive for frequency.  Neurological:  Positive for dizziness, tingling, weakness and headaches.  Psychiatric/Behavioral:  The patient is nervous/anxious and has insomnia.      PHYSICAL EXAMINATION: ECOG PERFORMANCE STATUS: 1 - Symptomatic but completely ambulatory  Vitals:   08/02/24 0900  BP: 113/74  Pulse: 72  Resp: 18  Temp: 97.7 F (36.5 C)  SpO2: 98%   Filed Weights   08/02/24 0900  Weight: 112 lb 3.2 oz (50.9 kg)    Physical Exam Constitutional:      Appearance: Normal appearance.  Cardiovascular:     Rate and Rhythm: Normal rate and regular rhythm.  Pulmonary:     Effort: Pulmonary effort is normal.     Breath sounds: Normal breath sounds.  Abdominal:     General: Bowel sounds are normal.     Palpations: Abdomen is soft.  Musculoskeletal:        General: No swelling. Normal range of motion.  Lymphadenopathy:     Head:     Right side of head: Posterior auricular and occipital adenopathy present. No submental, submandibular or tonsillar adenopathy.     Left side of head: Posterior auricular and occipital adenopathy present. No submental, submandibular  or tonsillar adenopathy.     Cervical: Cervical adenopathy present.     Right cervical: Posterior cervical adenopathy present. No superficial or deep cervical adenopathy.    Left cervical: Posterior cervical adenopathy present. No superficial or deep cervical adenopathy.     Upper Body:     Right upper body: No axillary adenopathy.     Left upper body: No axillary adenopathy.     Lower Body: No right inguinal adenopathy. No left inguinal adenopathy.  Neurological:     Mental Status: She is alert and oriented to person, place, and time. Mental status is at baseline.      LABORATORY DATA:  I have reviewed the data as listed    Latest Ref Rng & Units 08/02/2024    9:28 AM 06/14/2022   12:47 PM 10/09/2021    2:25 AM  CBC  WBC 4.0 - 10.5 K/uL 9.3  8.7  6.2   Hemoglobin 12.0 - 15.0 g/dL 85.8  84.6  87.6   Hematocrit 36.0 - 46.0 % 41.3  45.5  36.6   Platelets 150 - 400 K/uL 170  212  160        Latest Ref Rng & Units 08/02/2024    9:28 AM 07/19/2023    4:00 PM 04/19/2023    3:24 PM  CMP  Glucose 70 - 99 mg/dL 88  892  87   BUN 8 - 23 mg/dL 18  47  12   Creatinine 0.44 - 1.00 mg/dL 9.21  8.49  9.17   Sodium 135 - 145 mmol/L 138  138  140   Potassium 3.5 - 5.1 mmol/L 3.6  4.3  4.1   Chloride 98 - 111 mmol/L 102  105  104   CO2 22 - 32 mmol/L 23  23  27    Calcium  8.9 - 10.3 mg/dL 9.2  8.8  9.4   Total Protein 6.5 - 8.1 g/dL 7.5     Total Bilirubin 0.0 - 1.2 mg/dL 0.4     Alkaline Phos 38 - 126 U/L 101     AST 15 - 41 U/L 28     ALT 0 - 44  U/L 16        RADIOGRAPHIC STUDIES: I have personally reviewed the radiological images as listed and agreed with the findings in the report. US  SOFT TISSUE HEAD & NECK (NON-THYROID ) Result Date: 07/18/2024 EXAM: US  Neck Nonvascular Soft Tissue Ultrasound 07/10/2024 11:35:49 AM CLINICAL HISTORY: 68 year old female with lymph node swelling in the neck for 1 year. TECHNIQUE: Grayscale and brief color doppler imaging in the bilateral neck with  image documentation. COMPARISON: CTA head and neck 10/07/2021. FINDINGS: SOFT TISSUES: Background fibromuscular architecture appears normal. Subcentimeter bilateral cervical lymph nodes are identified. On the right side, lymph nodes measure up to 3 to 4 mm short axis. On the left side, lymph nodes with normal fatty hila measure up to 6 mm short axis. No discrete soft tissue mass. No fluid collection. IMPRESSION: 1. Physiologic appearing bilateral cervical lymph nodes. No mass or lymphadenopathy by ultrasound. Electronically signed by: Helayne Hurst MD 07/18/2024 05:40 AM EST RP Workstation: [id number]    ASSESSMENT & PLAN Assessment & Plan Cervical lymphadenopathy Reports intermittent lymphadenopathy for 18 months to 2 years. Most recently had a soft tissue ultrasound of her neck which showed bilateral cervical lymph nodes. Patient had a CT CAP back in 2023 for weight loss and former smoker.  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  Differentials include infectious process, inflammatory process, lymphoproliferative disorder or metastatic disease.  Labs today to check CBC, CMP, LDH, flow cytometry, ESR and CRP levels Most recent ultrasound did not reveal lymph nodes larger than 1 cm so will defer biopsy at this time. Given all of her other symptoms including weight loss and multiple swollen lymph nodes would recommend PET scan. RTC once workup is complete.   Loss of weight See plan above. Loss of appetite See plan above.    Orders Placed This Encounter  Procedures   NM PET Image Initial (PI) Skull Base To Thigh    Standing Status:   Future    Expected Date:   08/09/2024    Expiration Date:   08/02/2025    If indicated for the ordered procedure, I authorize the administration of a radiopharmaceutical per Radiology protocol:   Yes    Preferred  imaging location?:   [facility]    Release to patient:   Immediate   Comp panel: Leukemia/Lymphoma    Standing Status:   Future    Number of Occurrences:   1    Expected Date:   08/02/2024    Expiration Date:   10/31/2024   CBC with Differential    Standing Status:   Future    Number of Occurrences:   1    Expected Date:   08/02/2024    Expiration Date:   10/31/2024   Comprehensive metabolic panel    Standing Status:   Future    Number of Occurrences:   1    Expected Date:   08/02/2024    Expiration Date:   10/31/2024   Sedimentation rate    Standing Status:   Future    Number of Occurrences:   1    Expected Date:   08/02/2024    Expiration Date:   10/31/2024   C-reactive protein    Standing Status:   Future    Number of Occurrences:   1    Expected Date:   08/02/2024    Expiration Date:   10/31/2024  Lactate dehydrogenase    Standing Status:   Future    Number of Occurrences:   1    Expected Date:   08/02/2024    Expiration Date:   10/31/2024    All questions were answered. The patient knows to call the clinic with any problems, questions or concerns.  I have spent a total of 40 minutes minutes of face-to-face and non-face-to-face time, preparing to see the patient, obtaining and/or reviewing separately obtained history, performing a medically appropriate examination, counseling and educating the patient, ordering medications/tests/procedures, referring and communicating with other health care professionals, documenting clinical information in the electronic health record, independently interpreting results and communicating results to the patient, and care coordination.   Delon Hope, AGNP-C Department of Hematology/Oncology [facility] at [facility]  Phone: [phone]

## 2024-08-02 NOTE — Patient Instructions (Signed)
 [facility]  Discharge Instructions   Rapid Diagnostic Service Visit Discharge Information and Instructions  Thank you for choosing [facility] for your healthcare needs.  Below is a summary of today's discussion, along with our contact information and an outline of what to expect next.  Reason for Visit:  lymphadenopathy  Proposed Diagnostic Care Plan: Labs today. We will schedule you for PET scan.  3.  You will follow up with Randall Hope, NP after the PET scan results.   What to Expect: - Generally, when lab tests are ordered the results can take up to 1 week for results to be available.  At that point, we will contact you to discuss your results with you.  Unless there is a critical result, we will typically wait for all of your lab results to be available before contacting you. - If a biopsy is part of your Care Plan, those results can take on average 7-10 days to result.  Once results are available, we will contact you to discuss your pathology results and any next steps. - If you have additional imaging ordered, such as a CT Scan, MRI, Ultrasound, Bone Scan, or PET scan, your imaging will need to be authorized then scheduled with the earliest available appointment.  You may be asked to travel to another hospital within [facility] who has a sooner availability, please consider doing so if asked. - If you use MyChart, your results will be available to you in the MyChart portal.  Your provider will be in touch with you as soon as all of your results are available to be discussed.  Your Diagnostic Clinic Provider:  Delon Hope, NP. Your Diagnostic Navigator:  Dena Daring, RN.  Contact number [phone].   If you or your caregiver have number blocking on your cell phones, please ensure the cancer center's numbers are not blocked.  If you are not a registered MyChart user, please consider enrolling in MyChart to receive your test results and visit  notes.  You can also access your discharge instructions electronically.  MyChart also gives you an electronic means to communicate with your Care Team instead of needing to call in to the cancer center.  We appreciate you trusting us  with your healthcare and look forward to partnering with you as we work to uncover what your potential diagnosis may be.  Please do not hesitate to reach out at any point with questions or concerns.     Thank you for choosing  [facility] to provide your oncology and hematology care.   To afford each patient quality time with our provider, please arrive at least 15 minutes before your scheduled appointment time. You may need to reschedule your appointment if you arrive late (10 or more minutes). Arriving late affects you and other patients whose appointments are after yours.  Also, if you miss three or more appointments without notifying the office, you may be dismissed from the clinic at the provider's discretion.    Again, thank you for choosing [facility].  Our hope is that these requests will decrease the amount of time that you wait before being seen by our physicians.   If you have a lab appointment with the Cancer Center - please note that after April 8th, all labs will be drawn in the cancer center.  You do not have to check in or register with the main entrance as you have in the  past but will complete your check-in at the cancer center.            _____________________________________________________________  Should you have questions after your visit to [facility], please contact our office at [phone] and follow the prompts.  Our office hours are 8:00 a.m. to 4:30 p.m. Monday - Thursday and 8:00 a.m. to 2:30 p.m. Friday.  Please note that voicemails left after 4:00 p.m. may not be returned until the following business day.  We are closed weekends and all major holidays.  You do have access to a  nurse 24-7, just call the main number to the clinic [phone] and do not press any options, hold on the line and a nurse will answer the phone.    For prescription refill requests, have your pharmacy contact our office and allow 72 hours.    Masks are no longer required in the cancer centers. If you would like for your care team to wear a mask while they are taking care of you, please let them know. You may have one support person who is at least 68 years old accompany you for your appointments.

## 2024-08-06 LAB — COMP PANEL: LEUKEMIA/LYMPHOMA

## 2024-08-09 ENCOUNTER — Other Ambulatory Visit (HOSPITAL_COMMUNITY): Payer: Self-pay | Admitting: Cardiology

## 2024-08-13 ENCOUNTER — Encounter (HOSPITAL_COMMUNITY): Admission: RE | Admit: 2024-08-13 | Discharge: 2024-08-13 | Attending: Oncology | Admitting: Oncology

## 2024-08-13 DIAGNOSIS — R634 Abnormal weight loss: Secondary | ICD-10-CM | POA: Diagnosis present

## 2024-08-13 DIAGNOSIS — R933 Abnormal findings on diagnostic imaging of other parts of digestive tract: Secondary | ICD-10-CM | POA: Insufficient documentation

## 2024-08-13 DIAGNOSIS — R590 Localized enlarged lymph nodes: Secondary | ICD-10-CM | POA: Diagnosis present

## 2024-08-13 DIAGNOSIS — I2510 Atherosclerotic heart disease of native coronary artery without angina pectoris: Secondary | ICD-10-CM | POA: Insufficient documentation

## 2024-08-13 DIAGNOSIS — R918 Other nonspecific abnormal finding of lung field: Secondary | ICD-10-CM | POA: Insufficient documentation

## 2024-08-13 DIAGNOSIS — I700 Atherosclerosis of aorta: Secondary | ICD-10-CM | POA: Diagnosis not present

## 2024-08-13 DIAGNOSIS — R9389 Abnormal findings on diagnostic imaging of other specified body structures: Secondary | ICD-10-CM | POA: Insufficient documentation

## 2024-08-13 DIAGNOSIS — R630 Anorexia: Secondary | ICD-10-CM | POA: Insufficient documentation

## 2024-08-13 DIAGNOSIS — J439 Emphysema, unspecified: Secondary | ICD-10-CM | POA: Diagnosis not present

## 2024-08-13 LAB — GLUCOSE, CAPILLARY: Glucose-Capillary: 117 mg/dL — ABNORMAL HIGH (ref 70–99)

## 2024-08-13 MED ORDER — FLUDEOXYGLUCOSE F - 18 (FDG) INJECTION
5.3000 | Freq: Once | INTRAVENOUS | Status: AC
  Administered 2024-08-13: 16:00:00 5.9 via INTRAVENOUS

## 2024-08-16 ENCOUNTER — Other Ambulatory Visit (HOSPITAL_COMMUNITY)

## 2024-08-20 ENCOUNTER — Inpatient Hospital Stay: Admitting: Physician Assistant

## 2024-08-20 VITALS — BP 90/73 | HR 70 | Temp 98.0°F | Resp 18 | Wt 111.1 lb

## 2024-08-20 DIAGNOSIS — R590 Localized enlarged lymph nodes: Secondary | ICD-10-CM | POA: Diagnosis not present

## 2024-08-20 NOTE — Progress Notes (Signed)
 " [facility] [phone]   [phone]  PROGRESS NOTE:  Patient Care Team: Margarete Maeola DASEN, FNP as PCP - General (Nurse Practitioner) Sheena Pugh, DO as PCP - Cardiology (Cardiology) Fernande Elspeth BROCKS, MD (Inactive) as PCP - Electrophysiology (Cardiology)  CHIEF COMPLAINTS/PURPOSE OF CONSULTATION:  Lymphadenopathy  HISTORY OF PRESENTING ILLNESS:  Stacey Hartman 68 y.o. female returns for follow-up after evaluation for lymphadenopathy.  She was last seen by my colleague Randall Hope, NP on 08/02/2024.  Ms. Sinning reports that she continues to have poor energy levels which impacts her ADLs.  She has a good appetite and her weight is overall stable since the last visit.  She reports occasional episodes of nausea without any vomiting episodes.  Her bowel habits are unchanged without recurrent episodes of diarrhea or constipation.  She continues to feel palpable lymph nodes around her neck which are painful for her.  She adds that she has pain in her knees that flareup periodically.  She reports having rectal discharge which is white in color for the past 3 years.  She denies any signs of bleeding including hematochezia or melena.  She reports having stable shortness of breath and a dry cough.  Patient denies fevers, chills, night sweats, chest pain, headaches or dizziness. Rest of the 10  ROS is below.   MEDICAL HISTORY:  Past Medical History:  Diagnosis Date   Anxiety    Chronic systolic CHF (congestive heart failure), NYHA class 3 (HCC)    Fibromyalgia    Hypothyroid    LBBB (left bundle branch block)    NICM (nonischemic cardiomyopathy) (HCC)    Thyroid  disease    Weight loss, non-intentional     SURGICAL HISTORY: Past Surgical History:  Procedure Laterality Date   ANKLE SURGERY Right    BIV ICD INSERTION CRT-D N/A 06/21/2022   Procedure: BIV ICD INSERTION CRT-D;  Surgeon: Fernande Elspeth BROCKS, MD;  Location: [facility] INVASIVE CV LAB;   Service: Cardiovascular;  Laterality: N/A;   CHOLECYSTECTOMY     FLEXIBLE SIGMOIDOSCOPY N/A 05/24/2022   Procedure: FLEXIBLE SIGMOIDOSCOPY;  Surgeon: Legrand Victory LITTIE DOUGLAS, MD;  Location: [facility] ENDOSCOPY;  Service: Gastroenterology;  Laterality: N/A;  NO SEDATION   RIGHT/LEFT HEART CATH AND CORONARY ANGIOGRAPHY N/A 10/05/2021   Procedure: RIGHT/LEFT HEART CATH AND CORONARY ANGIOGRAPHY;  Surgeon: Rolan Ezra RAMAN, MD;  Location: [facility] INVASIVE CV LAB;  Service: Cardiovascular;  Laterality: N/A;   TUBAL LIGATION      SOCIAL HISTORY: Social History   Socioeconomic History   Marital status: Widowed    Spouse name: Not on file   Number of children: 2   Years of education: Not on file   Highest education level: Not on file  Occupational History   Occupation: disabled  Tobacco Use   Smoking status: Former    Current packs/day: 0.00    Average packs/day: 0.5 packs/day for 25.0 years (12.5 ttl pk-yrs)    Types: Cigarettes    Start date: 46    Quit date: 2002    Years since quitting: 23.9    Passive exposure: Current   Smokeless tobacco: Never  Vaping Use   Vaping status: Never Used  Substance and Sexual Activity   Alcohol use: Not Currently   Drug use: Never   Sexual activity: Not on file  Other Topics Concern   Not on file  Social History Narrative   Not on file   Social Drivers of Health   Tobacco Use:  Medium Risk (07/19/2023)   Patient History    Smoking Tobacco Use: Former    Smokeless Tobacco Use: Never    Passive Exposure: Current  Programmer, Applications: Not on file  Food Insecurity: No Food Insecurity (08/02/2024)   Epic    Worried About Programme Researcher, Broadcasting/film/video in the Last Year: Never true    Ran Out of Food in the Last Year: Never true  Transportation Needs: No Transportation Needs (08/02/2024)   Epic    Lack of Transportation (Medical): No    Lack of Transportation (Non-Medical): No  Physical Activity: Not on file  Stress: Not on file  Social Connections: Not on file   Intimate Partner Violence: Not At Risk (08/02/2024)   Epic    Fear of Current or Ex-Partner: No    Emotionally Abused: No    Physically Abused: No    Sexually Abused: No  Depression (PHQ2-9): Low Risk (08/20/2024)   Depression (PHQ2-9)    PHQ-2 Score: 0  Alcohol Screen: Not on file  Housing: Low Risk (08/02/2024)   Epic    Unable to Pay for Housing in the Last Year: No    Number of Times Moved in the Last Year: 0    Homeless in the Last Year: No  Utilities: Not At Risk (08/02/2024)   Epic    Threatened with loss of utilities: No  Health Literacy: Not on file    FAMILY HISTORY: Family History  Problem Relation Age of Onset   Melanoma Mother    Heart attack Father    Heart disease Father    Melanoma Sister    Melanoma Brother    Heart failure Brother    Colon cancer Maternal Grandmother    Healthy Son    Stomach cancer Maternal Uncle    Esophageal cancer Neg Hx    Rectal cancer Neg Hx     ALLERGIES:  is allergic to penicillins and sulfa antibiotics.  MEDICATIONS:  Current Outpatient Medications  Medication Sig Dispense Refill   atorvastatin  (LIPITOR) 40 MG tablet TAKE ONE TABLET BY MOUTH EVERY DAY 30 tablet 5   carboxymethylcellulose (REFRESH TEARS) 0.5 % SOLN Place 1 drop into both eyes 3 (three) times daily as needed (dry eyes).     clonazePAM (KLONOPIN) 0.5 MG tablet Take 0.5 mg by mouth 2 (two) times daily.     clopidogrel  (PLAVIX ) 75 MG tablet TAKE ONE TABLET BY MOUTH EVERY DAY 30 tablet 5   dapagliflozin  propanediol (FARXIGA ) 10 MG TABS tablet TAKE ONE TABLET BY MOUTH EVERY DAY 90 tablet 1   DULoxetine (CYMBALTA) 20 MG capsule Take 20 mg by mouth daily.     esomeprazole (NEXIUM) 20 MG capsule Take 20 mg by mouth daily as needed.     gabapentin  (NEURONTIN ) 300 MG capsule Take 300 mg by mouth at bedtime.     levothyroxine  (SYNTHROID ) 150 MCG tablet TAKE (1) TABLET BY MOUTH DAILY BEFORE BREAKFAST. 30 tablet 12   metoprolol  succinate (TOPROL -XL) 50 MG 24 hr tablet  TAKE ONE TABLET BY MOUTH AT BEDTIME (TAKE WITH OR IMMEDIATELY FOLLOWING A MEAL) 30 tablet 5   sacubitril-valsartan (ENTRESTO ) 24-26 MG TAKE 1/2 TABLET BY MOUTH TWICE DAILY 90 tablet 3   spironolactone  (ALDACTONE ) 25 MG tablet TAKE ONE TABLET BY MOUTH EVERY DAY 30 tablet 5   No current facility-administered medications for this visit.    REVIEW OF SYSTEMS:   Review of Systems  Constitutional:  Positive for malaise/fatigue.  Respiratory:  Positive for cough and shortness  of breath.   Cardiovascular:  Negative for chest pain.  Gastrointestinal:  Positive for nausea. Negative for abdominal pain, blood in stool, constipation, diarrhea, melena and vomiting.  Musculoskeletal:  Positive for neck pain.  Skin:  Negative for rash.     PHYSICAL EXAMINATION: ECOG PERFORMANCE STATUS: 1 - Symptomatic but completely ambulatory  Vitals:   08/20/24 1412  BP: 90/73  Pulse: 70  Resp: 18  Temp: 98 F (36.7 C)  SpO2: 97%   Filed Weights   08/20/24 1412  Weight: 111 lb 1.8 oz (50.4 kg)   PHYSICAL EXAM: Constitutional: Oriented to person, place, and time and well-developed, well-nourished, and in no distress.  HENT:  Head: Normocephalic and atraumatic.  Eyes: Conjunctivae are normal. Right eye exhibits no discharge. Left eye exhibits no discharge. No scleral icterus.  Neck: Normal range of motion. Neck supple.   Cardiovascular: Normal rate, regular rhythm, normal heart sounds and intact distal pulses.   Pulmonary/Chest: Effort normal and breath sounds normal. No respiratory distress. No wheezes. No rales.  Abdominal: Soft. Bowel sounds are normal. Exhibits no distension and no mass. There is no tenderness.  Musculoskeletal: Normal range of motion. Exhibits no edema.  Lymphadenopathy: No cervical, supraclavicular or axillary adenopathy.  Neurological: Alert and oriented to person, place, and time. Exhibits normal muscle tone. Gait normal. Coordination normal.  Skin: Skin is warm and dry. No rash  noted. Not diaphoretic. No erythema. No pallor.  Psychiatric: Mood, memory and judgment normal.      LABORATORY DATA:  I have reviewed the data as listed    Latest Ref Rng & Units 08/02/2024    9:28 AM 06/14/2022   12:47 PM 10/09/2021    2:25 AM  CBC  WBC 4.0 - 10.5 K/uL 9.3  8.7  6.2   Hemoglobin 12.0 - 15.0 g/dL 85.8  84.6  87.6   Hematocrit 36.0 - 46.0 % 41.3  45.5  36.6   Platelets 150 - 400 K/uL 170  212  160        Latest Ref Rng & Units 08/02/2024    9:28 AM 07/19/2023    4:00 PM 04/19/2023    3:24 PM  CMP  Glucose 70 - 99 mg/dL 88  892  87   BUN 8 - 23 mg/dL 18  47  12   Creatinine 0.44 - 1.00 mg/dL 9.21  8.49  9.17   Sodium 135 - 145 mmol/L 138  138  140   Potassium 3.5 - 5.1 mmol/L 3.6  4.3  4.1   Chloride 98 - 111 mmol/L 102  105  104   CO2 22 - 32 mmol/L 23  23  27    Calcium  8.9 - 10.3 mg/dL 9.2  8.8  9.4   Total Protein 6.5 - 8.1 g/dL 7.5     Total Bilirubin 0.0 - 1.2 mg/dL 0.4     Alkaline Phos 38 - 126 U/L 101     AST 15 - 41 U/L 28     ALT 0 - 44 U/L 16        RADIOGRAPHIC STUDIES: I have personally reviewed the radiological images as listed and agreed with the findings in the report. NM PET Image Initial (PI) Skull Base To Thigh Result Date: 08/18/2024 EXAM: PET AND CT SKULL BASE TO MID THIGH 08/13/2024 05:53:11 PM TECHNIQUE: RADIOPHARMACEUTICAL: 5.9 mCi F-18 FDG Uptake time 60 minutes. Glucose level 117 mg/dl. Blood pool SUV 1.6. PET imaging was acquired from the base of the skull to the  mid thighs. Non-contrast enhanced computed tomography was obtained for attenuation correction and anatomic localization. COMPARISON: Chest, abdomen, and pelvic CTs of 03/06/2022. Clinic note of 08/02/2024. CLINICAL HISTORY: Cervical lymphadenopathy, weight loss, loss of appetite. FINDINGS: HEAD AND NECK: Muscular activity within the neck is likely secondary to motion after radiopharmaceutical injection. Given this mild limitation, no cervical nodal hypermetabolism. Left  carotid atherosclerosis. Diffuse thyroid  hypermetabolism at SUV 8.1. CHEST: There are no hypermetabolic mediastinal, hilar or axillary lymph nodes. No metabolically active pulmonary nodules. Pacer/ICD, mild cardiomegaly. Aortic and coronary artery calcifications. Tiny hiatal hernia. Centrilobular emphysema. Thin-walled cysts and nodularity are again identified in upper lobe predominant. ABDOMEN AND PELVIS: Mild hypermetabolism corresponding to the anal wall thickening on the prior diagnostic CT, for example at SUV 6.0 on image 191/4. No well-defined correlate mass. Cholecystectomy. Abdominal aortic atherosclerosis. No splenic hypermetabolism or splenomegaly. There is no hypermetabolic activity within the liver, adrenal glands, or pancreas. There is no hypermetabolic nodal activity in the abdomen or pelvis. BONES AND SOFT TISSUE: Right rotator cuff repair. There is no hypermetabolic activity to suggest osseous metastatic disease. IMPRESSION: 1. No evidence of metabolically active cervical adenopathy or lymphoma. 2. Anal hypermetabolism corresponding to an area of questioned wall thickening on the 03/06/22 CT. the apparent wall thickening is less impressive today and may be attributed to pelvic floor laxity and rectal prolapse. this should be amenable to direct visualization to exclude proctitis or mass. 3. Diffuse thyroid  hypermetabolism can be seen with thyroiditis. Consider correlation with thyroid  function tests. 4. Pulmonary parenchymal findings are suboptimally evaluated on this non-dedicated exam but remain suspicious for Langerhans cell histiocytosis. 5. Aortic atherosclerosis (ICD10-I70.0), coronary artery atherosclerosis, and emphysema (ICD10-J43.9). Electronically signed by: Rockey Kilts MD 08/18/2024 06:07 PM EST RP Workstation: [id number]    ASSESSMENT & PLAN Stacey Hartman returns for a follow up visit for evaluation of lymphadenopathy.   #Lymphadenopathy: --Suspect transient lymphadenopathy from a  reactive process (Thyroiditis?) --Reviewed PET scan results from 08/13/2024 that showed no evidence of metabolically active lymphadenopathy is suggesting malignancy. --Re-reviewed US  results from 07/10/2024 that showed subcentimeter lymph nodes.  -- Reviewed labs from 08/02/2024 that showed no cytopenias, normal inflammatory markers, and flow cytometry showed no monoclonal B-cell population that was detected -- Physical exam findings showed no palpable lymphadenopathy. -- No indication for biopsy as there is no physical exam or radiographic evidence of pathological lymphadenopathy (lymph nodes >1 cm). -- We will see the patient back in 3 months to repeat physical exam and then can be discharged from our clinic.  #Possible Thyroiditis: -- Seen on PET scan from 08/13/2024 -- Patient is currently on Synthroid  150 mcg by mouth daily.  Will ask PCP to recheck thyroid  levels and adjust medications as needed.  #Hypermetabolism in anal canal: -- Seen on PET scan from 08/13/2024. -- Previous CT scan from 03/06/2022 did show rectal wall thickening but sigmoidoscopy from 05/24/2022 was unremarkable. -- Patient reports having rectal discharge that is white in color but no rectal pain or hematochezia. -- Will request to follow-up with Dr. Legrand (GI) to further evaluate.   #Pulmonary findings: -- Previous imaging and most recent PET scan shows thin-walled cysts and nodularities in the upper lobe predominantly suspicious for Langerhans cell histiocytosis.  -- Will request radiology to compare to previous imaging to determine if there is any change.  --Will forward results to pulmonology for their awareness to follow.   No orders of the defined types were placed in this encounter.   All questions were answered.  The patient knows to call the clinic with any problems, questions or concerns.  I have spent a total of 30 minutes minutes of face-to-face and non-face-to-face time, preparing to see the patient,  performing a medically appropriate examination, counseling and educating the patient, referring and communicating with other health care professionals, documenting clinical information in the electronic health record, independently interpreting results and communicating results to the patient, and care coordination.   Johnston Police PA-C Department of Hematology/Oncology [facility]   "

## 2024-08-27 ENCOUNTER — Encounter: Payer: Self-pay | Admitting: *Deleted

## 2024-08-27 NOTE — Progress Notes (Signed)
 [facility] for Malignancy  Hand-off Note  08/27/2024 4:11 PM  Stacey Hartman Apr 09, 1956 [id number]  Cancer Care, Care Team: Delon Hope, NP Dena Daring, RN, Diagnostic Nurse Navigator  Stacey Hartman was referred to Cancer Care on 07/10/24 for evaluation of:  lymphadenopathy.  The patient's diagnostic work-up included: Labs PET scan  The patient was found to not have malignancy at this time, as evaluated for the reason for referral stated above.  The recommended follow-up provided to the patient includes:  Follow up in clinic in 3 months.  We thank you for allowing us  to assist in Stacey Hartman's care.  The initial and most recent Progress Notes, labs, imaging, procedure(s), and/or consult notes have been routed to you through [facility] or faxed to your office for continuity of care.

## 2024-08-29 ENCOUNTER — Encounter: Payer: Self-pay | Admitting: *Deleted

## 2024-08-29 NOTE — Progress Notes (Signed)
 Per Johnston Police, patient and PCP notified PET indicates possible thyroiditis and recommends that she have a follow up visit as soon as possible with repeat thyroid  panel.  Per office, patient has appointment on 1/12.  Patient verbalized understanding of above.

## 2024-09-03 ENCOUNTER — Ambulatory Visit (HOSPITAL_COMMUNITY): Admitting: Cardiology

## 2024-09-17 ENCOUNTER — Ambulatory Visit: Payer: 59

## 2024-09-17 DIAGNOSIS — I5022 Chronic systolic (congestive) heart failure: Secondary | ICD-10-CM

## 2024-09-18 ENCOUNTER — Ambulatory Visit (HOSPITAL_COMMUNITY): Admitting: Cardiology

## 2024-09-18 LAB — CUP PACEART REMOTE DEVICE CHECK
Battery Remaining Longevity: 44 mo
Battery Remaining Percentage: 64 %
Battery Voltage: 2.96 V
Brady Statistic AP VP Percent: 7 %
Brady Statistic AP VS Percent: 1 %
Brady Statistic AS VP Percent: 93 %
Brady Statistic AS VS Percent: 1 %
Brady Statistic RA Percent Paced: 6.9 %
Date Time Interrogation Session: 20260119113932
HighPow Impedance: 63 Ohm
HighPow Impedance: 63 Ohm
Implantable Lead Connection Status: 753985
Implantable Lead Connection Status: 753985
Implantable Lead Connection Status: 753985
Implantable Lead Implant Date: 20231023
Implantable Lead Implant Date: 20231023
Implantable Lead Implant Date: 20231023
Implantable Lead Location: 753858
Implantable Lead Location: 753859
Implantable Lead Location: 753860
Implantable Lead Model: 7122
Implantable Pulse Generator Implant Date: 20231023
Lead Channel Impedance Value: 1075 Ohm
Lead Channel Impedance Value: 380 Ohm
Lead Channel Impedance Value: 540 Ohm
Lead Channel Pacing Threshold Amplitude: 0.5 V
Lead Channel Pacing Threshold Amplitude: 0.75 V
Lead Channel Pacing Threshold Amplitude: 2.5 V
Lead Channel Pacing Threshold Pulse Width: 0.5 ms
Lead Channel Pacing Threshold Pulse Width: 0.5 ms
Lead Channel Pacing Threshold Pulse Width: 1 ms
Lead Channel Sensing Intrinsic Amplitude: 0.5 mV
Lead Channel Sensing Intrinsic Amplitude: 11.3 mV
Lead Channel Setting Pacing Amplitude: 2 V
Lead Channel Setting Pacing Amplitude: 2 V
Lead Channel Setting Pacing Amplitude: 3.25 V
Lead Channel Setting Pacing Pulse Width: 0.5 ms
Lead Channel Setting Pacing Pulse Width: 1 ms
Lead Channel Setting Sensing Sensitivity: 0.5 mV
Pulse Gen Serial Number: 8949490
Zone Setting Status: 755011

## 2024-09-20 ENCOUNTER — Ambulatory Visit: Payer: Self-pay | Admitting: Cardiology

## 2024-09-21 ENCOUNTER — Encounter (HOSPITAL_COMMUNITY): Payer: Self-pay

## 2024-09-21 ENCOUNTER — Ambulatory Visit (HOSPITAL_COMMUNITY): Admission: RE | Admit: 2024-09-21 | Source: Ambulatory Visit | Admitting: Cardiology

## 2024-09-21 NOTE — Progress Notes (Signed)
 Remote ICD Transmission

## 2024-10-03 NOTE — Progress Notes (Incomplete)
 "  ADVANCED HF CLINIC NOTE   Primary Care: Margarete Maeola DASEN, FNP HF Cardiologist: Stacey Hartman  HPI: [name] is a 69 y.o. with a history of hypothyroidism, former tobacco abuse (quit 20 years ago), COPD, and systolic heart failure with severely reduced EF.    Father with MI, nephew died with heart failure at 69 yrs old.    Echo 2/23 EF < 20% RV normal.  Sent to ED from cardiology office after echo completed.  Had CP and SOB in waiting room/triage and taken for urgent R/LHC. Cardiac cath with no significant CAD, normal PA and PCWP, LVEDP 28 mmHg, CO preserved.  cMRI with LVEF 12%, no myocardial LGE, elevated T1 indices, ECV 42% in basal septum. Developed acute left sided facial numbness during cMRI. Code stroke called. MRI brain with punctate acute infarct in high right frontal cortex. No AF detected during admit. 30-day monitor showed no AF either. PYP equivocal for transthyretin amyloidosis (grade 1, H/CL 1.47). She was discharged home, weight 122 lbs.  Echo 5/23, EF remained < 20%, moderate LV dilation, normal RV, IVC normal.  Referred to EP for CRT-D.  Follow up 6/23 with Dr. Fernande to discuss CRT-D. Concern for on-going weight loss and CT chest A/P ordered to rule out malignancy before proceeding with CRT. This showed rectal wall thickening, concerning for malignancy, however no other findings to explain weight loss.  She saw GI, noted to have an unremarkable c-scope about a year ago.  Had sigmoidoscopy 9/23 with no evidence of malignancy.  S/p St. Jude CRT-D 10/23.  Echo 8/24 EF 40-45%, RV normal, IVC normal.   Today she returns for HF follow up. Overall feeling fine. She had an episode of CP last week while watching TV, associated with lip and L arm numbness. Pain resolved spontaneously, lasted 3-4 hours, but she still has facial numbness. She also has a facial tic she attributes to being on Entresto . She is dizzy when she moves too fast. She is not SOB walking on flat ground, has SOB  with heavy housework.  Denies palpitations, edema, or PND/Orthopnea. Appetite ok, drinking 2 Ensures a day. No fever or chills. Weight at home 109 pounds. Taking all medications.   St Jude CRT-D device interrogation: >99% BiV pacing, no AF/VT, CorVue elevated suggesting low volume.   ECG (personally reviewed): A sensed, V paced.   Labs (2/23): K 3.9, creatinine 0.63, hgb 12.3 Labs (4/23): K 4.2, creatinine 0.75 Labs (6/23): K 4.0, creatinine 0.76, LDL 48, HDL 47 Labs (9/23): K 4.2, creatinine 0.62, BNP 366 Labs (10/23): K 4.5, creatinine 0.93 Labs (12/23): K 4.3, creatinine 0.71 Labs (6/24): K 4.6, creatinine 0.87 Labs (8/24): K 4.1, creatinine 0.82  PMH: 1. Hypothyroidism 2. 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 Fibromyalgia 4. Raynaud's phenomenon 5. Chronic systolic CHF: Nonischemic cardiomyopathy. Negative [employer] gene testing.  - Echo (2/23): EF < 20%, RV normal.  - LHC/RHC (2/23): mean RA 2, PA 20/12 mean 13, mean PCWP 7, CI 2.2 Fick/2.79 thermo; no significant CAD.  - Cardiac MRI (2/23): LV EF 12%. No myocardial LGE. Elevated T1 indices, ECV was 42% in the basal septum.  - PYP (2/23): Grade 1, H/CL 1.47 (equivocal); TTR gene testing negative.  - Echo (5/23): EF < 20%, moderate LV dilation, normal RV, IVC normal.  - s/p St. Jude CRT-D (10/23). - Echo (8/24): EF 40-45%, RV normal, IVC normal. 6. CVA: 2/23.  - Zio monitor  x 1 month showed no AF.  7. HCV: Treated.   Current Outpatient Medications  Medication Sig Dispense Refill   atorvastatin  (LIPITOR) 40 MG tablet TAKE ONE TABLET BY MOUTH EVERY DAY 30 tablet 5   carboxymethylcellulose (REFRESH TEARS) 0.5 % SOLN Place 1 drop into both eyes 3 (three) times daily as needed (dry eyes).     clonazePAM (KLONOPIN) 0.5 MG tablet Take 0.5 mg by mouth 2 (two) times daily.     clopidogrel  (PLAVIX ) 75 MG tablet TAKE ONE TABLET BY MOUTH EVERY DAY 30 tablet 5   dapagliflozin   propanediol (FARXIGA ) 10 MG TABS tablet TAKE ONE TABLET BY MOUTH EVERY DAY 90 tablet 1   DULoxetine (CYMBALTA) 20 MG capsule Take 20 mg by mouth daily.     esomeprazole (NEXIUM) 20 MG capsule Take 20 mg by mouth daily as needed.     gabapentin  (NEURONTIN ) 300 MG capsule Take 300 mg by mouth at bedtime.     levothyroxine  (SYNTHROID ) 150 MCG tablet TAKE (1) TABLET BY MOUTH DAILY BEFORE BREAKFAST. 30 tablet 12   metoprolol  succinate (TOPROL -XL) 50 MG 24 hr tablet TAKE ONE TABLET BY MOUTH AT BEDTIME (TAKE WITH OR IMMEDIATELY FOLLOWING A MEAL) 30 tablet 5   sacubitril-valsartan (ENTRESTO ) 24-26 MG TAKE 1/2 TABLET BY MOUTH TWICE DAILY 90 tablet 3   spironolactone  (ALDACTONE ) 25 MG tablet TAKE ONE TABLET BY MOUTH EVERY DAY 30 tablet 5   No current facility-administered medications for this visit.   Allergies  Allergen Reactions   Penicillins Shortness Of Breath    Trouble breathing    Sulfa Antibiotics Other (See Comments)    Causes Thrush    Social History   Socioeconomic History   Marital status: Widowed    Spouse name: Not on file   Number of children: 2   Years of education: Not on file   Highest education level: Not on file  Occupational History   Occupation: disabled  Tobacco Use   Smoking status: Former    Current packs/day: 0.00    Average packs/day: 0.5 packs/day for 25.0 years (12.5 ttl pk-yrs)    Types: Cigarettes    Start date: 19    Quit date: 2002    Years since quitting: 24.1    Passive exposure: Current   Smokeless tobacco: Never  Vaping Use   Vaping status: Never Used  Substance and Sexual Activity   Alcohol use: Not Currently   Drug use: Never   Sexual activity: Not on file  Other Topics Concern   Not on file  Social History Narrative   Not on file   Social Drivers of Health   Tobacco Use: Medium Risk (07/19/2023)   Patient History    Smoking Tobacco Use: Former    Smokeless Tobacco Use: Never    Passive Exposure: Current  Physicist, Medical  Strain: Not on file  Food Insecurity: No Food Insecurity (08/02/2024)   Epic    Worried About Programme Researcher, Broadcasting/film/video in the Last Year: Never true    Ran Out of Food in the Last Year: Never true  Transportation Needs: No Transportation Needs (08/02/2024)   Epic    Lack of Transportation (Medical): No    Lack of Transportation (Non-Medical): No  Physical Activity: Not on file  Stress: Not on file  Social Connections: Not on file  Intimate Partner Violence: Not At Risk (08/02/2024)   Epic    Fear of Current or Ex-Partner: No    Emotionally Abused: No    Physically  Abused: No    Sexually Abused: No  Depression (PHQ2-9): Low Risk (08/20/2024)   Depression (PHQ2-9)    PHQ-2 Score: 0  Alcohol Screen: Not on file  Housing: Low Risk (08/02/2024)   Epic    Unable to Pay for Housing in the Last Year: No    Number of Times Moved in the Last Year: 0    Homeless in the Last Year: No  Utilities: Not At Risk (08/02/2024)   Epic    Threatened with loss of utilities: No  Health Literacy: Not on file   Family History  Problem Relation Age of Onset   Melanoma Mother    Heart attack Father    Heart disease Father    Melanoma Sister    Melanoma Brother    Heart failure Brother    Colon cancer Maternal Grandmother    Healthy Son    Stomach cancer Maternal Uncle    Esophageal cancer Neg Hx    Rectal cancer Neg Hx    LMP  (LMP Unknown)   Wt Readings from Last 3 Encounters:  08/20/24 50.4 kg (111 lb 1.8 oz)  08/02/24 50.9 kg (112 lb 3.2 oz)  07/19/23 49.6 kg (109 lb 6.4 oz)   Physical Exam General:  NAD. No resp difficulty, walked into the clinic, thin HEENT: Normal Neck: Supple. No JVD. Carotids 2+ bilat; no bruits. No lymphadenopathy or thryomegaly appreciated. Cor: PMI nondisplaced. Regular rate & rhythm. No rubs, gallops or murmurs. Lungs: Clear Abdomen: Soft, nontender, nondistended. No hepatosplenomegaly. No bruits or masses. Good bowel sounds. Extremities: No cyanosis, clubbing,  rash, edema Neuro: Alert & oriented x 3, cranial nerves grossly intact. Moves all 4 extremities w/o difficulty. Affect pleasant.  ASSESSMENT & PLAN: 1. Chronic systolic CHF/NICM/LBBB: Etiology not certain. No CAD on LHC. 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 She has a strong family history of CHF (father died at 92 with MI, brother with CHF, nephew with CHF died at 51). Possible familial cardiomyopathy but [employer] gene testing was negative.  Echo 2/23 showed EF < 20% with diffuse hypokinesis and normal RV.  No LV thrombus noted. R/LHC in 2/23 showed no significant coronary disease, normal RA pressure and PCWP, LVEDP 28 mmHg, preserved CO. cMRI (2/23) showed no myocardial LGE, ECV 42% in the basal septum. PYP not suggestive of TTR amyloidosis (at most equivocal) and genetic testing for TTR mutation was negative. Echo 5/23 EF remains < 20% with relatively normal RV.  Now s/p St Jude CRT-D.  Echo 8/24 showed improvement, EF 40-45%, RV normal, IVC normal.  NYHA II symptoms, volume low today on CorVue and ReDs 23%. GDMT has been limited by low BP.  - With concern for hypovolemia, hold Entresto , spiro and Farxiga  x 2 days. Liberalize po intake. - After 2 days, resume spiro 25 mg daily and Farxiga  10 mg daily. - Resume Entresto  at lower dose of 12/13 mg bid. - Continue Toprol  XL 50 mg at bedtime. 2. CVA: Code Stroke during cMRI. Head CT negative, MRI brain + punctate acute infarct in the high right frontal cortex. CTA Head and Neck no LVO. Suspect infarct due to probable cardio embolic and small vessel disease. No Afib detected during admit. 30 day monitor in 3/23 showed no Afib. Episode of CP associated with face and L arm numbness. She has a new facial tic. Needs Neuro follow up, she was previously referred but was unable to make appts due to transportation issues. Will arrange referral - Consider carotids. - Continue Plavix ,  no ASA.  - Continue atorvastatin . Check lipids today. 3. COPD/prior  tobacco use/?Langerhans cell histiocytosis:  Chest CT 2/23 showed emphysema + 4 mm RUL Pulmonary nodule.  However, PFTs in 4/23 showed only minimal obstruction.  CT chest (7/23) showed emphysema as well as several cysts and nodules raising suspicion for Langerhans cell histiocytosis. She has seen Dr. Darlean for pulmonary, not thought to have major lung pathology.  4. Weight loss: CT chest, A/P showed rectal wall thickening, concerning for malignancy. Patient tells me she had had full work up by GI w/ colonoscopy last year and had 2 polyps removed.  No malignancy on sigmoidoscopy 9/23.  - She will drink Ensure daily.  5. CP: 1 episode of atypical CP lasting 3-4 hours, resolved spontaneously. ? MSK. This has scared her. No further episodes. LHC 2023 without evidence of CAD.  - Arrange Lexiscan for evaluate for evidence of ischemia. - We discussed ED precautions.  Follow up in 4 months with Stacey Hartman [facility] FNP-BC 10/03/24 "

## 2024-10-05 ENCOUNTER — Telehealth (HOSPITAL_COMMUNITY): Payer: Self-pay

## 2024-10-05 NOTE — Telephone Encounter (Signed)
 Called to confirm/remind patient of their appointment at [facility] on 10/08/24.   Appointment:   [x] Confirmed  [] Left mess   [] No answer/No voice mail  [] VM Full/unable to leave message  [] Phone not in service  Patient reminded to bring all medications and/or complete list.  Confirmed patient has transportation. Gave directions, instructed to utilize valet parking.

## 2024-10-08 ENCOUNTER — Ambulatory Visit (HOSPITAL_COMMUNITY)

## 2024-11-20 ENCOUNTER — Inpatient Hospital Stay

## 2024-11-21 ENCOUNTER — Ambulatory Visit (HOSPITAL_COMMUNITY): Admitting: Cardiology

## 2024-11-27 ENCOUNTER — Inpatient Hospital Stay: Admitting: Oncology

## 2024-12-17 ENCOUNTER — Ambulatory Visit: Payer: 59

## 2025-03-18 ENCOUNTER — Ambulatory Visit: Payer: 59

## 2025-06-17 ENCOUNTER — Ambulatory Visit: Payer: 59
